# Patient Record
Sex: Male | Born: 1980 | Race: White | Hispanic: No | Marital: Married | Smoking: Never smoker
Health system: Southern US, Community
[De-identification: ages and names within clinical notes are randomized; demographics above are authoritative.]

## PROBLEM LIST (undated history)

## (undated) DIAGNOSIS — G4733 Obstructive sleep apnea (adult) (pediatric): Secondary | ICD-10-CM

## (undated) DIAGNOSIS — E785 Hyperlipidemia, unspecified: Secondary | ICD-10-CM

## (undated) DIAGNOSIS — G4762 Sleep related leg cramps: Secondary | ICD-10-CM

## (undated) DIAGNOSIS — B019 Varicella without complication: Secondary | ICD-10-CM

## (undated) DIAGNOSIS — I10 Essential (primary) hypertension: Secondary | ICD-10-CM

## (undated) DIAGNOSIS — E669 Obesity, unspecified: Secondary | ICD-10-CM

## (undated) HISTORY — DX: Obesity, unspecified: E66.9

## (undated) HISTORY — DX: Essential (primary) hypertension: I10

## (undated) HISTORY — DX: Hyperlipidemia, unspecified: E78.5

## (undated) HISTORY — PX: WISDOM TOOTH EXTRACTION: SHX21

## (undated) HISTORY — DX: Varicella without complication: B01.9

## (undated) HISTORY — DX: Obstructive sleep apnea (adult) (pediatric): G47.33

## (undated) HISTORY — DX: Sleep related leg cramps: G47.62

---

## 2001-09-09 ENCOUNTER — Encounter: Payer: Self-pay | Admitting: Emergency Medicine

## 2001-09-09 ENCOUNTER — Emergency Department (HOSPITAL_COMMUNITY): Admission: EM | Admit: 2001-09-09 | Discharge: 2001-09-09 | Payer: Self-pay | Admitting: Emergency Medicine

## 2007-08-13 ENCOUNTER — Ambulatory Visit: Payer: Self-pay | Admitting: Gastroenterology

## 2014-04-02 ENCOUNTER — Ambulatory Visit (INDEPENDENT_AMBULATORY_CARE_PROVIDER_SITE_OTHER): Payer: 59 | Admitting: Podiatry

## 2014-04-02 ENCOUNTER — Ambulatory Visit (INDEPENDENT_AMBULATORY_CARE_PROVIDER_SITE_OTHER): Payer: 59

## 2014-04-02 ENCOUNTER — Ambulatory Visit: Payer: Self-pay | Admitting: Podiatry

## 2014-04-02 ENCOUNTER — Encounter: Payer: Self-pay | Admitting: Podiatry

## 2014-04-02 VITALS — BP 134/83 | HR 98 | Resp 16 | Ht 68.0 in | Wt 270.0 lb

## 2014-04-02 DIAGNOSIS — M722 Plantar fascial fibromatosis: Secondary | ICD-10-CM

## 2014-04-02 DIAGNOSIS — M779 Enthesopathy, unspecified: Secondary | ICD-10-CM

## 2014-04-02 MED ORDER — DICLOFENAC SODIUM 75 MG PO TBEC: 75.0000 mg | DELAYED_RELEASE_TABLET | Freq: Two times a day (BID) | ORAL | Status: DC

## 2014-04-02 MED ORDER — TRIAMCINOLONE ACETONIDE 10 MG/ML IJ SUSP
10.0000 mg | Freq: Once | INTRAMUSCULAR | Status: AC
  Administered 2014-04-02: 10 mg

## 2014-04-02 NOTE — Progress Notes (Signed)
Subjective:     Patient ID: Caleb [name], male   DOB: [birth year], 33 y.o.   MRN: [id number]  HPI patient states that he's been having increased pain in his feet recently with his heels bothering him and his forefoot around the metatarsal phalangeal joints. It's been getting gradually worse over the last few months   Review of Systems     Objective:   Physical Exam Neurovascular status intact with no health history changes noted and discomfort in the plantar heel of both feet along with lesser MPJs of both feet with mild intensity    Assessment:     Plantar fasciitis of both heels with also compensation and probable forefoot metatarsalgia    Plan:     H&P and x-rays reviewed. Injected the plantar fascia both feet 3 mg Kenalog 500 like Marcaine mixture and advised him reduced activity

## 2014-04-02 NOTE — Patient Instructions (Signed)

## 2014-04-23 ENCOUNTER — Encounter: Payer: Self-pay | Admitting: Podiatry

## 2014-04-23 ENCOUNTER — Ambulatory Visit (INDEPENDENT_AMBULATORY_CARE_PROVIDER_SITE_OTHER): Payer: 59 | Admitting: Podiatry

## 2014-04-23 VITALS — BP 132/80 | HR 99 | Resp 16

## 2014-04-23 DIAGNOSIS — M722 Plantar fascial fibromatosis: Secondary | ICD-10-CM

## 2014-04-23 NOTE — Progress Notes (Signed)
Subjective:     Patient ID: Caleb [name], male   DOB: 1981/03/09, 33 y.o.   MRN: [id number]  HPI patient states that his heels are feeling much better and he is able to walk without pain in his heels or his forefoot for most of his day   Review of Systems     Objective:   Physical Exam Neurovascular status intact with muscle strength adequate and range of motion subtalar midtarsal joint normal. Patient's found to have discomfort in the   plantar heel of a minimal nature and is noted to be walking with a good heel toe gait pattern Assessment:     Plantar fasciitis of both heels that continues to improve    Plan:     Advised on orthotics physical therapy and supportive shoe gear at this time. Will probably require periodic treatment do to activity levels and I also advised on weight loss

## 2014-06-20 ENCOUNTER — Other Ambulatory Visit: Payer: Self-pay | Admitting: Podiatry

## 2015-06-07 ENCOUNTER — Other Ambulatory Visit: Payer: Self-pay

## 2015-06-07 MED ORDER — LISINOPRIL 10 MG PO TABS: 10.0000 mg | ORAL_TABLET | Freq: Every day | ORAL | Status: DC

## 2015-06-07 NOTE — Telephone Encounter (Signed)
Patient was last seen on 01/03/15, practice partner number is [phone], and [facility] [address].

## 2015-06-16 DIAGNOSIS — E785 Hyperlipidemia, unspecified: Secondary | ICD-10-CM

## 2015-06-16 DIAGNOSIS — E669 Obesity, unspecified: Secondary | ICD-10-CM

## 2015-06-16 DIAGNOSIS — R252 Cramp and spasm: Secondary | ICD-10-CM | POA: Insufficient documentation

## 2015-06-16 DIAGNOSIS — I10 Essential (primary) hypertension: Secondary | ICD-10-CM | POA: Insufficient documentation

## 2015-06-16 DIAGNOSIS — G4733 Obstructive sleep apnea (adult) (pediatric): Secondary | ICD-10-CM

## 2015-06-20 ENCOUNTER — Ambulatory Visit: Payer: Self-pay | Admitting: Unknown Physician Specialty

## 2015-07-08 ENCOUNTER — Other Ambulatory Visit: Payer: Self-pay

## 2015-07-08 MED ORDER — LISINOPRIL 10 MG PO TABS
10.0000 mg | ORAL_TABLET | Freq: Every day | ORAL | Status: DC
Start: 2015-07-08 — End: 2015-07-12

## 2015-07-08 NOTE — Telephone Encounter (Signed)
Patient has appointment on 07/12/15 and [facility] is [facility] [address].

## 2015-07-08 NOTE — Telephone Encounter (Signed)
Normal creatinine and K+ Feb 2016; Rx approved

## 2015-07-12 ENCOUNTER — Encounter: Payer: Self-pay | Admitting: Unknown Physician Specialty

## 2015-07-12 ENCOUNTER — Ambulatory Visit (INDEPENDENT_AMBULATORY_CARE_PROVIDER_SITE_OTHER): Payer: 59 | Admitting: Unknown Physician Specialty

## 2015-07-12 VITALS — BP 131/84 | HR 90 | Temp 98.6°F | Ht 66.7 in | Wt 280.2 lb

## 2015-07-12 DIAGNOSIS — E785 Hyperlipidemia, unspecified: Secondary | ICD-10-CM

## 2015-07-12 DIAGNOSIS — I10 Essential (primary) hypertension: Secondary | ICD-10-CM | POA: Diagnosis not present

## 2015-07-12 DIAGNOSIS — Z23 Encounter for immunization: Secondary | ICD-10-CM | POA: Diagnosis not present

## 2015-07-12 DIAGNOSIS — E669 Obesity, unspecified: Secondary | ICD-10-CM | POA: Diagnosis not present

## 2015-07-12 LAB — LIPID PANEL PICCOLO, WAIVED
Chol/HDL Ratio Piccolo,Waive: 4.3 mg/dL
Cholesterol Piccolo, Waived: 176 mg/dL (ref <200)
HDL Chol Piccolo, Waived: 41 mg/dL — ABNORMAL LOW (ref >59)
LDL Chol Calc Piccolo Waived: 102 mg/dL — ABNORMAL HIGH (ref <100)
Triglycerides Piccolo,Waived: 164 mg/dL — ABNORMAL HIGH (ref <150)
VLDL Chol Calc Piccolo,Waive: 33 mg/dL — ABNORMAL HIGH (ref <30)

## 2015-07-12 LAB — BAYER DCA HB A1C WAIVED: HB A1C (BAYER DCA - WAIVED): 5.4 % (ref <7.0)

## 2015-07-12 MED ORDER — LISINOPRIL 10 MG PO TABS: 10.0000 mg | ORAL_TABLET | Freq: Every day | ORAL | Status: DC

## 2015-07-12 NOTE — Assessment & Plan Note (Signed)
Stable, continue present medications.   

## 2015-07-12 NOTE — Assessment & Plan Note (Signed)
LDL is 101.  Focus on diet and weight loss

## 2015-07-12 NOTE — Progress Notes (Signed)
BP 131/84 mmHg  Pulse 90  Temp(Src) 98.6 F (37 C)  Ht 5' 6.7" (1.694 m)  Wt 280 lb 3.2 oz (127.098 kg)  BMI 44.29 kg/m2  SpO2 97%   Subjective:    Patient ID: Caleb [name], male    DOB: 08/27/1981, 34 y.o.   MRN: [id number]  HPI: Caleb [name] is a 34 y.o. male  Chief Complaint  Patient presents with  . Hypertension    pt states he needs Lisinopril refilled, requests 90 day supply if possible   Hypertension This is a chronic problem. Pertinent negatives include no chest pain, headaches, palpitations, shortness of breath or sweats. The current treatment provides significant improvement. There are no compliance problems.  There is no history of chronic renal disease.  Hyperlipidemia Chronicity: Not on statins.  Tried diet, lost weight but gained it back. The problem is controlled. Recent lipid tests were reviewed and are normal. Exacerbating diseases include obesity. He has no history of chronic renal disease. Pertinent negatives include no chest pain or shortness of breath. He is currently on no antihyperlipidemic treatment.    Relevant past medical, surgical, family and social history reviewed and updated as indicated. Interim medical history since our last visit reviewed. Allergies and medications reviewed and updated.  Review of Systems  Respiratory: Negative for shortness of breath.   Cardiovascular: Negative for chest pain and palpitations.  Neurological: Negative for headaches.    Per HPI unless specifically indicated above     Objective:    BP 131/84 mmHg  Pulse 90  Temp(Src) 98.6 F (37 C)  Ht 5' 6.7" (1.694 m)  Wt 280 lb 3.2 oz (127.098 kg)  BMI 44.29 kg/m2  SpO2 97%  Wt Readings from Last 3 Encounters:  07/12/15 280 lb 3.2 oz (127.098 kg)  01/03/15 282 lb (127.914 kg)  04/02/14 270 lb (122.471 kg)    Physical Exam  Constitutional: He is oriented to person, place, and time. He appears well-developed and well-nourished. No distress.  HENT:  Head:  Normocephalic and atraumatic.  Eyes: Conjunctivae and lids are normal. Right eye exhibits no discharge. Left eye exhibits no discharge. No scleral icterus.  Neck: Normal range of motion. Neck supple.  Cardiovascular: Normal rate and regular rhythm.   Pulmonary/Chest: Effort normal. No respiratory distress.  Abdominal: Normal appearance and bowel sounds are normal. He exhibits no distension. There is no splenomegaly or hepatomegaly. There is no tenderness.  Musculoskeletal: Normal range of motion.  Neurological: He is alert and oriented to person, place, and time.  Skin: Skin is warm, dry and intact. No rash noted. No pallor.  Psychiatric: He has a normal mood and affect. His behavior is normal. Judgment and thought content normal.  Nursing note and vitals reviewed.   No results found for this or any previous visit.    Assessment & Plan:   Problem List Items Addressed This Visit      Unprioritized   Obesity   Relevant Orders   Bayer DCA Hb A1c Waived   Hypertension    Stable, continue present medications.       Relevant Medications   lisinopril (PRINIVIL,ZESTRIL) 10 MG tablet   Other Relevant Orders   Lipid Panel Piccolo, Waived   Microalbumin, Urine Waived   Uric acid   Comprehensive metabolic panel   Hyperlipidemia    LDL is 101.  Focus on diet and weight loss      Relevant Medications   lisinopril (PRINIVIL,ZESTRIL) 10 MG tablet   Other  Relevant Orders   Lipid Panel Piccolo, Waived   Comprehensive metabolic panel    Other Visit Diagnoses    Immunization due    -  Primary    Relevant Orders    Flu Vaccine QUAD 36+ mos PF IM (Fluarix & Fluzone Quad PF) (Completed)        Follow up plan: Return in about 6 months (around 01/09/2016) for physical hhhn.

## 2015-07-13 LAB — COMPREHENSIVE METABOLIC PANEL
A/G RATIO: 1.8 (ref 1.1–2.5)
ALT: 29 IU/L (ref 0–44)
AST: 16 IU/L (ref 0–40)
Albumin: 4.2 g/dL (ref 3.5–5.5)
Alkaline Phosphatase: 93 IU/L (ref 39–117)
BILIRUBIN TOTAL: 0.4 mg/dL (ref 0.0–1.2)
BUN/Creatinine Ratio: 14 (ref 8–19)
BUN: 12 mg/dL (ref 6–20)
CHLORIDE: 101 mmol/L (ref 97–108)
CO2: 23 mmol/L (ref 18–29)
Calcium: 9.5 mg/dL (ref 8.7–10.2)
Creatinine, Ser: 0.87 mg/dL (ref 0.76–1.27)
GFR calc Af Amer: 128 mL/min/{1.73_m2} (ref >59)
GFR calc non Af Amer: 111 mL/min/{1.73_m2} (ref >59)
GLUCOSE: 98 mg/dL (ref 65–99)
Globulin, Total: 2.3 g/dL (ref 1.5–4.5)
POTASSIUM: 4.7 mmol/L (ref 3.5–5.2)
Sodium: 141 mmol/L (ref 134–144)
Total Protein: 6.5 g/dL (ref 6.0–8.5)

## 2015-07-13 LAB — URIC ACID: Uric Acid: 6.2 mg/dL (ref 3.7–8.6)

## 2015-07-20 ENCOUNTER — Telehealth: Payer: Self-pay | Admitting: Unknown Physician Specialty

## 2015-07-20 DIAGNOSIS — G4730 Sleep apnea, unspecified: Secondary | ICD-10-CM

## 2015-07-20 NOTE — Telephone Encounter (Signed)
Pt would like rx for cpap equipment faxed to [phone] put order #[id number] with it.

## 2015-07-21 NOTE — Telephone Encounter (Signed)
I need to know his settings.

## 2015-07-21 NOTE — Telephone Encounter (Signed)
Routing to provider  

## 2015-07-22 NOTE — Telephone Encounter (Signed)
Called and asked patient his settings. Patient stated that he has the regular cpap and not the bipap. Also stated that it was set on [place].

## 2015-07-22 NOTE — Telephone Encounter (Signed)
I put the order in.  Thanks!

## 2015-12-02 ENCOUNTER — Ambulatory Visit (INDEPENDENT_AMBULATORY_CARE_PROVIDER_SITE_OTHER): Payer: 59 | Admitting: Family Medicine

## 2015-12-02 ENCOUNTER — Encounter: Payer: Self-pay | Admitting: Family Medicine

## 2015-12-02 VITALS — BP 124/82 | HR 88 | Temp 98.2°F | Ht 67.0 in | Wt 292.5 lb

## 2015-12-02 DIAGNOSIS — I10 Essential (primary) hypertension: Secondary | ICD-10-CM | POA: Diagnosis not present

## 2015-12-02 DIAGNOSIS — M79671 Pain in right foot: Secondary | ICD-10-CM | POA: Diagnosis not present

## 2015-12-02 DIAGNOSIS — E785 Hyperlipidemia, unspecified: Secondary | ICD-10-CM | POA: Diagnosis not present

## 2015-12-02 DIAGNOSIS — Z130 Encounter for screening for diseases of the blood and blood-forming organs and certain disorders involving the immune mechanism: Secondary | ICD-10-CM

## 2015-12-02 DIAGNOSIS — M79672 Pain in left foot: Secondary | ICD-10-CM

## 2015-12-02 DIAGNOSIS — Z0000 Encounter for general adult medical examination without abnormal findings: Secondary | ICD-10-CM

## 2015-12-02 DIAGNOSIS — Z6841 Body Mass Index (BMI) 40.0 and over, adult: Secondary | ICD-10-CM

## 2015-12-02 MED ORDER — PHENTERMINE-TOPIRAMATE ER 3.75-23 MG PO CP24: ORAL_CAPSULE | ORAL | Status: DC

## 2015-12-02 NOTE — Patient Instructions (Signed)
We will call with your nutrition referral.  Take the medication as prescribed.  Follow up in ~ 3 months.  Take care  Dr. Lacinda Axon   Health Maintenance, Male A healthy lifestyle and preventative care can promote health and wellness.  Maintain regular health, dental, and eye exams.  Eat a healthy diet. Foods like vegetables, fruits, whole grains, low-fat dairy products, and lean protein foods contain the nutrients you need and are low in calories. Decrease your intake of foods high in solid fats, added sugars, and salt. Get information about a proper diet from your health care provider, if necessary.  Regular physical exercise is one of the most important things you can do for your health. Most adults should get at least 150 minutes of moderate-intensity exercise (any activity that increases your heart rate and causes you to sweat) each week. In addition, most adults need muscle-strengthening exercises on 2 or more days a week.   Maintain a healthy weight. The body mass index (BMI) is a screening tool to identify possible weight problems. It provides an estimate of body fat based on height and weight. Your health care provider can find your BMI and can help you achieve or maintain a healthy weight. For males 20 years and older:  A BMI below 18.5 is considered underweight.  A BMI of 18.5 to 24.9 is normal.  A BMI of 25 to 29.9 is considered overweight.  A BMI of 30 and above is considered obese.  Maintain normal blood lipids and cholesterol by exercising and minimizing your intake of saturated fat. Eat a balanced diet with plenty of fruits and vegetables. Blood tests for lipids and cholesterol should begin at age 65 and be repeated every 5 years. If your lipid or cholesterol levels are high, you are over age 40, or you are at high risk for heart disease, you may need your cholesterol levels checked more frequently.Ongoing high lipid and cholesterol levels should be treated with medicines if  diet and exercise are not working.  If you smoke, find out from your health care provider how to quit. If you do not use tobacco, do not start.  Lung cancer screening is recommended for adults aged 45-80 years who are at high risk for developing lung cancer because of a history of smoking. A yearly low-dose CT scan of the lungs is recommended for people who have at least a 30-pack-year history of smoking and are current smokers or have quit within the past 15 years. A pack year of smoking is smoking an average of 1 pack of cigarettes a day for 1 year (for example, a 30-pack-year history of smoking could mean smoking 1 pack a day for 30 years or 2 packs a day for 15 years). Yearly screening should continue until the smoker has stopped smoking for at least 15 years. Yearly screening should be stopped for people who develop a health problem that would prevent them from having lung cancer treatment.  If you choose to drink alcohol, do not have more than 2 drinks per day. One drink is considered to be 12 oz (360 mL) of beer, 5 oz (150 mL) of wine, or 1.5 oz (45 mL) of liquor.  Avoid the use of street drugs. Do not share needles with anyone. Ask for help if you need support or instructions about stopping the use of drugs.  High blood pressure causes heart disease and increases the risk of stroke. High blood pressure is more likely to develop in:  People  who have blood pressure in the end of the normal range (100-139/85-89 mm Hg).  People who are overweight or obese.  People who are African American.  If you are 40-44 years of age, have your blood pressure checked every 3-5 years. If you are 66 years of age or older, have your blood pressure checked every year. You should have your blood pressure measured twice--once when you are at a hospital or clinic, and once when you are not at a hospital or clinic. Record the average of the two measurements. To check your blood pressure when you are not at a  hospital or clinic, you can use:  An automated blood pressure machine at a pharmacy.  A home blood pressure monitor.  If you are 51-13 years old, ask your health care provider if you should take aspirin to prevent heart disease.  Diabetes screening involves taking a blood sample to check your fasting blood sugar level. This should be done once every 3 years after age 67 if you are at a normal weight and without risk factors for diabetes. Testing should be considered at a younger age or be carried out more frequently if you are overweight and have at least 1 risk factor for diabetes.  Colorectal cancer can be detected and often prevented. Most routine colorectal cancer screening begins at the age of 73 and continues through age 80. However, your health care provider may recommend screening at an earlier age if you have risk factors for colon cancer. On a yearly basis, your health care provider may provide home test kits to check for hidden blood in the stool. A small camera at the end of a tube may be used to directly examine the colon (sigmoidoscopy or colonoscopy) to detect the earliest forms of colorectal cancer. Talk to your health care provider about this at age 81 when routine screening begins. A direct exam of the colon should be repeated every 5-10 years through age 21, unless early forms of precancerous polyps or small growths are found.  People who are at an increased risk for hepatitis B should be screened for this virus. You are considered at high risk for hepatitis B if:  You were born in a country where hepatitis B occurs often. Talk with your health care provider about which countries are considered high risk.  Your parents were born in a high-risk country and you have not received a shot to protect against hepatitis B (hepatitis B vaccine).  You have HIV or AIDS.  You use needles to inject street drugs.  You live with, or have sex with, someone who has hepatitis B.  You are a  man who has sex with other men (MSM).  You get hemodialysis treatment.  You take certain medicines for conditions like cancer, organ transplantation, and autoimmune conditions.  Hepatitis C blood testing is recommended for all people born from [birth year] through 1967 and any individual with known risk factors for hepatitis C.  Healthy men should no longer receive prostate-specific antigen (PSA) blood tests as part of routine cancer screening. Talk to your health care provider about prostate cancer screening.  Testicular cancer screening is not recommended for adolescents or adult males who have no symptoms. Screening includes self-exam, a health care provider exam, and other screening tests. Consult with your health care provider about any symptoms you have or any concerns you have about testicular cancer.  Practice safe sex. Use condoms and avoid high-risk sexual practices to reduce the spread of sexually  transmitted infections (STIs).  You should be screened for STIs, including gonorrhea and chlamydia if:  You are sexually active and are younger than 24 years.  You are older than 24 years, and your health care provider tells you that you are at risk for this type of infection.  Your sexual activity has changed since you were last screened, and you are at an increased risk for chlamydia or gonorrhea. Ask your health care provider if you are at risk.  If you are at risk of being infected with HIV, it is recommended that you take a prescription medicine daily to prevent HIV infection. This is called pre-exposure prophylaxis (PrEP). You are considered at risk if:  You are a man who has sex with other men (MSM).  You are a heterosexual man who is sexually active with multiple partners.  You take drugs by injection.  You are sexually active with a partner who has HIV.  Talk with your health care provider about whether you are at high risk of being infected with HIV. If you choose to begin PrEP,  you should first be tested for HIV. You should then be tested every 3 months for as long as you are taking PrEP.  Use sunscreen. Apply sunscreen liberally and repeatedly throughout the day. You should seek shade when your shadow is shorter than you. Protect yourself by wearing long sleeves, pants, a wide-brimmed hat, and sunglasses year round whenever you are outdoors.  Tell your health care provider of new moles or changes in moles, especially if there is a change in shape or color. Also, tell your health care provider if a mole is larger than the size of a pencil eraser.  A one-time screening for abdominal aortic aneurysm (AAA) and surgical repair of large AAAs by ultrasound is recommended for men aged 62-75 years who are current or former smokers.  Stay current with your vaccines (immunizations).   This information is not intended to replace advice given to you by your health care provider. Make sure you discuss any questions you have with your health care provider.   Document Released: 04/05/2008 Document Revised: 10/29/2014 Document Reviewed: 03/05/2011 Elsevier Interactive Patient Education [employer].

## 2015-12-04 DIAGNOSIS — M79671 Pain in right foot: Secondary | ICD-10-CM | POA: Insufficient documentation

## 2015-12-04 DIAGNOSIS — E669 Obesity, unspecified: Secondary | ICD-10-CM | POA: Insufficient documentation

## 2015-12-04 DIAGNOSIS — M79672 Pain in left foot: Secondary | ICD-10-CM

## 2015-12-04 DIAGNOSIS — Z0000 Encounter for general adult medical examination without abnormal findings: Secondary | ICD-10-CM | POA: Insufficient documentation

## 2015-12-04 DIAGNOSIS — M722 Plantar fascial fibromatosis: Secondary | ICD-10-CM | POA: Insufficient documentation

## 2015-12-04 NOTE — Assessment & Plan Note (Signed)
Discussed treatment options today: exercise, referral to nutrition, medication, bariatric surgery.  Patient elected for short term medication and nutrition.  Rx for phentermine given.

## 2015-12-04 NOTE — Progress Notes (Signed)
Subjective:  Patient ID: Caleb [name], male    DOB: Jan 14, 1981  Age: 35 y.o. MRN: [id number]  CC: Establish care  HPI Caleb [name] is a 35 y.o. male presents to the clinic today to establish care. Concerns are below.   Preventative Healthcare  Immunizations  Tetanus - 2012.  Pneumococcal - N/A.   Flu - Up to date. Received earlier this year.  Labs: Labs today. See orders.   Exercise: Walks 2 miles daily.   Alcohol use: See below.   Smoking/tobacco use: See below.  Regular dental exams: Yes.   Wears seat belt: Yes.   Hypertension  Well controlled on Lisinopril.  Feet pain  Patient reports bilateral foot pain.  Worse at the end of the day.  No relieving factors.  Has a history plantar fasciitis.  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  L>R.   Pain radiates up left leg.  Obesity   Long standing history of obesity.  Has had success in the past (via diet and exercise; lost ~ 40 lbs). But he put back on the weight.  He would like to discuss treatment options today.   PMH, Surgical Hx, Family Hx, Social History reviewed and updated as below.  Past Medical History  Diagnosis Date  . Obesity   . Hypertension   . Hyperlipidemia   . Obstructive sleep apnea   . Chicken pox    Past Surgical History  Procedure Laterality Date  . Wisdom tooth extraction     Family History  Problem Relation Age of Onset  . Hypertension Mother   . Diabetes Father   . Cancer Maternal Grandmother     colon, liver  . Hypertension Maternal Grandmother   . Cancer Maternal Grandfather     prostate  . Hypertension Maternal Grandfather   . Anemia Paternal Grandmother   . Cancer Paternal Grandfather     leukemia  . Hypertension Maternal Aunt    Social History  Substance Use Topics  . Smoking status: Never Smoker   . Smokeless tobacco: Former Systems developer    Types: Chew  . Alcohol Use: 1.2 oz/week    2 Cans of beer per week     Comment: on occasion     Review of Systems  Respiratory: Positive for cough and shortness of breath.   Cardiovascular: Positive for leg swelling.  Endocrine:       Increased thirst.   Genitourinary: Positive for frequency.  Musculoskeletal:       Muscle cramps/aches. Joint pain/swelling.   Psychiatric/Behavioral:       Stress.   All other systems reviewed and are negative.   Objective:   Today's Vitals: BP 124/82 mmHg  Pulse 88  Temp(Src) 98.2 F (36.8 C) (Oral)  Ht 5' 7"  (1.702 m)  Wt 292 lb 8 oz (132.677 kg)  BMI 45.80 kg/m2  SpO2 94%  Physical Exam  Constitutional: He is oriented to person, place, and time. He appears well-developed and well-nourished. No distress.  Morbidly obese.   HENT:  Head: Normocephalic and atraumatic.  Nose: Nose normal.  Mouth/Throat: Oropharynx is clear and moist. No oropharyngeal exudate.  Normal TM's bilaterally.   Eyes: Conjunctivae are normal. No scleral icterus.  Neck: Neck supple. No thyromegaly present.  Cardiovascular: Normal rate and regular rhythm.   No murmur heard. Pulmonary/Chest: Effort normal and breath sounds normal. He has no wheezes. He has no rales.  Abdominal: Soft. He exhibits no distension. There is no tenderness. There is no rebound  and no guarding.  Musculoskeletal: Normal range of motion. He exhibits no edema.  Lymphadenopathy:    He has no cervical adenopathy.  Neurological: He is alert and oriented to person, place, and time.  Skin: Skin is warm and dry. No rash noted.  Psychiatric: He has a normal mood and affect.  Vitals reviewed.  Assessment & Plan:   Problem List Items Addressed This Visit    Preventative health care    Tdap and flu up to date. Screening labs today.       Pain in both feet    Exam unremarkable. Advised weight loss and orthotics.       Morbid obesity with BMI of 45.0-49.9, adult [facility])    Discussed treatment options today: exercise, referral to nutrition, medication, bariatric surgery.  Patient  elected for short term medication and nutrition.  Rx for phentermine given.      Relevant Medications   Phentermine-Topiramate 3.75-23 MG CP24   Other Relevant Orders   HgB A1c   Amb ref to Medical Nutrition Therapy-MNT   Hypertension    Well controlled. Continue Lisinopril.      Relevant Orders   Comp Met (CMET)   Hyperlipidemia   Relevant Orders   Lipid Profile    Other Visit Diagnoses    Screening for deficiency anemia    -  Primary    Relevant Orders    CBC       Outpatient Encounter Prescriptions as of 12/02/2015  Medication Sig  . clindamycin (CLEOCIN T) 1 % lotion Apply topically 2 (two) times daily as needed.   . multivitamin (ONE-A-DAY MEN'S) TABS tablet Take 1 tablet by mouth daily.  Marland Kitchen lisinopril (PRINIVIL,ZESTRIL) 10 MG tablet Take 1 tablet (10 mg total) by mouth daily.  . Phentermine-Topiramate 3.75-23 MG CP24 1 capsule daily for 14 days. Then increase to 2 capsules daily.   No facility-administered encounter medications on file as of 12/02/2015.    Follow-up: Return in about 3 months (around 02/29/2016).  [place]

## 2015-12-04 NOTE — Assessment & Plan Note (Signed)
Exam unremarkable. Advised weight loss and orthotics.

## 2015-12-04 NOTE — Assessment & Plan Note (Signed)
Tdap and flu up to date. Screening labs today.

## 2015-12-04 NOTE — Assessment & Plan Note (Signed)
Well controlled. Continue Lisinopril. 

## 2015-12-05 ENCOUNTER — Other Ambulatory Visit: Payer: Self-pay | Admitting: Family Medicine

## 2015-12-06 ENCOUNTER — Telehealth: Payer: Self-pay

## 2015-12-06 LAB — COMPREHENSIVE METABOLIC PANEL
A/G RATIO: 1.8 (ref 1.1–2.5)
ALK PHOS: 87 IU/L (ref 39–117)
ALT: 35 IU/L (ref 0–44)
AST: 13 IU/L (ref 0–40)
Albumin: 4.1 g/dL (ref 3.5–5.5)
BILIRUBIN TOTAL: 0.5 mg/dL (ref 0.0–1.2)
BUN / CREAT RATIO: 12 (ref 8–19)
BUN: 12 mg/dL (ref 6–20)
CHLORIDE: 101 mmol/L (ref 96–106)
CO2: 24 mmol/L (ref 18–29)
Calcium: 9.3 mg/dL (ref 8.7–10.2)
Creatinine, Ser: 1.02 mg/dL (ref 0.76–1.27)
GFR calc non Af Amer: 94 mL/min/{1.73_m2} (ref >59)
GFR, EST AFRICAN AMERICAN: 109 mL/min/{1.73_m2} (ref >59)
Globulin, Total: 2.3 g/dL (ref 1.5–4.5)
Glucose: 114 mg/dL — ABNORMAL HIGH (ref 65–99)
POTASSIUM: 4.7 mmol/L (ref 3.5–5.2)
Sodium: 142 mmol/L (ref 134–144)
TOTAL PROTEIN: 6.4 g/dL (ref 6.0–8.5)

## 2015-12-06 LAB — LIPID PANEL W/O CHOL/HDL RATIO
Cholesterol, Total: 194 mg/dL (ref 100–199)
HDL: 39 mg/dL — AB (ref >39)
LDL Calculated: 123 mg/dL — ABNORMAL HIGH (ref 0–99)
Triglycerides: 158 mg/dL — ABNORMAL HIGH (ref 0–149)
VLDL Cholesterol Cal: 32 mg/dL (ref 5–40)

## 2015-12-06 LAB — HGB A1C W/O EAG: HEMOGLOBIN A1C: 5.5 % (ref 4.8–5.6)

## 2015-12-06 NOTE — Telephone Encounter (Signed)
Pa for Qsymia started on cover my meds

## 2015-12-07 ENCOUNTER — Telehealth: Payer: Self-pay | Admitting: Family Medicine

## 2015-12-07 ENCOUNTER — Telehealth: Payer: Self-pay | Admitting: Unknown Physician Specialty

## 2015-12-07 NOTE — Telephone Encounter (Signed)
I just received a fax today, PA was denied due to patient hasn't tried, Belviq, contravee or saxenda.  Please advise?

## 2015-12-07 NOTE — Telephone Encounter (Signed)
We can try plain Phentermine or one of the others if he would like.

## 2015-12-07 NOTE — Telephone Encounter (Signed)
Pt called about his medication of Phentermine-Topiramate 3.75-23 MG . Pt was sent a message about the medication awaiting approval from the doctors office. Call pt @ [phone]. Thank you!

## 2015-12-07 NOTE — Telephone Encounter (Signed)
Spoke with the patient is requested Contravee.  He said his wife had good results with that one.  Please order thanks and I will follow up if PA is needed.

## 2015-12-07 NOTE — Telephone Encounter (Signed)
Lets try phentermine (not with topamax).

## 2015-12-07 NOTE — Telephone Encounter (Signed)
Would patient be okay with switching.

## 2015-12-08 ENCOUNTER — Other Ambulatory Visit: Payer: Self-pay | Admitting: Family Medicine

## 2015-12-08 MED ORDER — NALTREXONE-BUPROPION HCL ER 8-90 MG PO TB12: ORAL_TABLET | ORAL | Status: DC

## 2015-12-09 ENCOUNTER — Telehealth: Payer: Self-pay | Admitting: Family Medicine

## 2015-12-09 NOTE — Telephone Encounter (Signed)
Pa Process started on Cover my meds.

## 2015-12-09 NOTE — Telephone Encounter (Signed)
Pt called about his medication needing prior approval pt states this is going on the second week. Medication is Naltrexone-Bupropion HCl ER 8-90 MG TB12. [id number] [address], [place]. Call pt @ [phone]. Thank you!

## 2015-12-09 NOTE — Telephone Encounter (Signed)
Started PA process for new prescription for Contravee.

## 2015-12-12 NOTE — Telephone Encounter (Signed)
PA approved from today to 04/07/2016.

## 2015-12-13 ENCOUNTER — Telehealth: Payer: Self-pay | Admitting: Family Medicine

## 2015-12-13 NOTE — Telephone Encounter (Signed)
[facility] called stating that [facility] not [facility] so his upset that his insurance is not approving.

## 2015-12-14 NOTE — Telephone Encounter (Signed)
This was approved on Monday, can we call the pharmacy to check?

## 2015-12-14 NOTE — Telephone Encounter (Signed)
Patient was told that prescription was approved on Monday and that he needed to contact pharmacy about that medication to see if it was approved.

## 2015-12-15 ENCOUNTER — Encounter: Payer: 59 | Attending: Family Medicine | Admitting: Dietician

## 2015-12-15 ENCOUNTER — Encounter: Payer: Self-pay | Admitting: Dietician

## 2015-12-15 VITALS — Ht 67.0 in | Wt 292.1 lb

## 2015-12-15 DIAGNOSIS — G4730 Sleep apnea, unspecified: Secondary | ICD-10-CM | POA: Insufficient documentation

## 2015-12-15 DIAGNOSIS — E669 Obesity, unspecified: Secondary | ICD-10-CM

## 2015-12-15 DIAGNOSIS — I10 Essential (primary) hypertension: Secondary | ICD-10-CM | POA: Insufficient documentation

## 2015-12-15 NOTE — Patient Instructions (Signed)
   Start tracking food intake, try phone app such as MyFitnessPal or Lose It!, or Spark[employer].   Plan for a snack each night when you get home, and for a light meal in the morning, at least 1-2 hours after waking up.  Pre-portion snacks, avoid eating from a large box/bag, etc.   Keep starch portions to fist-size or less, and eat generous portions of vegetables. Ok to include vegetables with your snacks also.

## 2015-12-15 NOTE — Progress Notes (Signed)
Medical Nutrition Therapy: Visit start time: 1030  end time: 1130  Assessment:  Diagnosis: obesity Past medical history: HTN, sleep apnea Psychosocial issues/ stress concerns: reports high stress level, works long hours at stressful job Preferred learning method:  . Visual  Current weight: 392.1lbs with shoes  Height: 5'7" Medications, supplements: reviewed list in chart with patient  Progress and evaluation: Patient reports losing down to 230lbs several years ago, but has regained that weight.         Reports stressful job which affects eating; snacking late at night and then not hungry in the morning.         Has made changes to control portions and food choices; using a home-delivery meal service for supper meals  Physical activity: walking, stretching 30 minutes, 3 times a week  Dietary Intake:  Usual eating pattern includes 2 meals and 1-2 snacks per day. Dining out frequency: 4 meals per week.  Breakfast: none Snack: none Lunch: chicken, occasionally burger Snack: sometimes nuts, fruit snack, crackers, coffee Supper: 6pm chicken, sometimes shrimp or small steak, roasted vegetables, sweet potatoes. No fried foods. Snack: hungry when getting home from work. Sometimes cereal, chips, nuts, juice, tea.  Beverages: water, some sweet tea, juice. 1-2 sodas per week  Nutrition Care Education: Topics covered: weight management Basic nutrition: basic food groups, appropriate nutrient balance, appropriate meal and snack schedule, general nutrition guidelines    Weight control: behavioral changes for weight loss: importance of low fat and low sugar foods, portion control, other factors affecting weight such as stress and sleep.      1900kcal meal plan with 40-50%CHO, 20-30% protein, 30% fat. Other lifestyle changes:  Deep breathing and stretching exercises to relieve stress.   Nutritional Diagnosis:  [place]-3.3 Overweight/obesity As related to stress, excess caloric intake.  As evidenced by  patient report.  Intervention: Instruction as noted above.   Set goals with patient input.   Patient feels he will need to continue efforts to reduce food portions.    He will add protein to snack at night.    He requested follow-up more than 1 month out, and after out-of-town trip.   Education Materials given:  . Food lists/ Planning A Balanced Meal . Snacking handout . Goals/ instructions  Learner/ who was taught:  . Patient   Level of understanding: Marland Kitchen Verbalizes/ demonstrates competency  Demonstrated degree of understanding via:   Teach back Learning barriers: . None  Willingness to learn/ readiness for change: . Eager, change in progress  Monitoring and Evaluation:  Dietary intake, exercise, and body weight      follow up: 02/06/16

## 2016-01-13 ENCOUNTER — Encounter: Payer: 59 | Admitting: Unknown Physician Specialty

## 2016-02-03 ENCOUNTER — Other Ambulatory Visit: Payer: Self-pay | Admitting: Unknown Physician Specialty

## 2016-02-06 ENCOUNTER — Encounter: Payer: Self-pay | Admitting: Dietician

## 2016-02-06 ENCOUNTER — Encounter: Payer: 59 | Attending: Family Medicine | Admitting: Dietician

## 2016-02-06 VITALS — Ht 67.0 in | Wt 278.1 lb

## 2016-02-06 DIAGNOSIS — G4730 Sleep apnea, unspecified: Secondary | ICD-10-CM | POA: Insufficient documentation

## 2016-02-06 DIAGNOSIS — E669 Obesity, unspecified: Secondary | ICD-10-CM | POA: Diagnosis not present

## 2016-02-06 DIAGNOSIS — I10 Essential (primary) hypertension: Secondary | ICD-10-CM | POA: Diagnosis not present

## 2016-02-06 NOTE — Progress Notes (Signed)
Medical Nutrition Therapy: Visit start time: 0930  end time: 1000  Assessment:  Diagnosis: obesity Medical history changes: no changes per patient Psychosocial issues/ stress concerns: none  Current weight: 278.1lbs  Height: 5'7" Medications, supplement changes: no changes  Progress and evaluation: Weight loss of 12 lbs since 12/15/15.         Patient reports most significant change has been decrease in sugar intake--he has almost eliminated sugar in tea, has stopped using sweet coffee creamers,     And has read food labels to check sugar content in foods, choosing low sugar options.          He also reports smaller portions of starchy foods and sometimes meats i.e. Steak.          He is including fruits and vegetables with all meals.   Physical activity: walking on the job and additionally at home at least 3 times per week.   Dietary Intake:  Usual eating pattern includes 3 meals and 2 snacks per day. Dining out frequency: not assessed today.  Breakfast: breakfast cookie 100kcal, or low-sugar granola/protein bar + orange or tangerine Snack: maybe fruit, cheese stick Lunch: chicken grilled or baked, veg.  Snack: protein bar Supper: grilled or baked meats, salads or other veg.  Snack: fruit, nuts, or cheese stick Beverages: water, very low sugar tea or tea with Stevia or monk fruit sweetener, coffee  Nutrition Care Education: Topics covered: weight management Basic nutrition: appropriate meal and snack schedule: effects of long periods of fasting vs. eating at regular intervals on metabolism.    Weight control: reviewed goal of low sugar intake and health benefits; Discussed dealing with possible weight loss plateaus or potential obstacles to healthy diet and activity in upcoming months.  Hypertension: reviewed effects of weight loss on blood pressure.  Nutritional Diagnosis:  [place]-3.3 Overweight/obesity As related to history of excess calories.  As evidenced by patient  report.  Intervention: Discussion as noted above.   Commended patient for changes made thus far.   Encouraged him to continue with food tracking and regular weighing.    Patient requested next follow-up in 6 months; scheduled for October.   Education Materials given:  Marland Kitchen Goals/ instructions  Learner/ who was taught:  . Patient   Level of understanding: Marland Kitchen Verbalizes/ demonstrates competency  Demonstrated degree of understanding via:   Teach back Learning barriers: . None  Willingness to learn/ readiness for change: . Eager, change in progress  Monitoring and Evaluation:  Dietary intake, exercise, and body weight      follow up: 07/30/16

## 2016-02-06 NOTE — Patient Instructions (Signed)
   Continue to decrease added sugars in beverages and foods.   Keep up with healthy food choices and eating pattern as well as physical activity -- great job!

## 2016-02-21 ENCOUNTER — Ambulatory Visit (INDEPENDENT_AMBULATORY_CARE_PROVIDER_SITE_OTHER): Payer: 59 | Admitting: Unknown Physician Specialty

## 2016-02-21 ENCOUNTER — Encounter: Payer: Self-pay | Admitting: Unknown Physician Specialty

## 2016-02-21 VITALS — BP 134/83 | HR 94 | Temp 98.5°F | Ht 65.3 in | Wt 273.8 lb

## 2016-02-21 DIAGNOSIS — I10 Essential (primary) hypertension: Secondary | ICD-10-CM | POA: Diagnosis not present

## 2016-02-21 DIAGNOSIS — Z6841 Body Mass Index (BMI) 40.0 and over, adult: Secondary | ICD-10-CM | POA: Diagnosis not present

## 2016-02-21 DIAGNOSIS — E785 Hyperlipidemia, unspecified: Secondary | ICD-10-CM | POA: Diagnosis not present

## 2016-02-21 DIAGNOSIS — Z0000 Encounter for general adult medical examination without abnormal findings: Secondary | ICD-10-CM | POA: Diagnosis not present

## 2016-02-21 LAB — UA/M W/RFLX CULTURE, ROUTINE
BILIRUBIN UA: NEGATIVE
GLUCOSE, UA: NEGATIVE
KETONES UA: NEGATIVE
Leukocytes, UA: NEGATIVE
NITRITE UA: NEGATIVE
Protein, UA: NEGATIVE
RBC UA: NEGATIVE
Specific Gravity, UA: <1.005 — ABNORMAL LOW (ref 1.005–1.030)
UUROB: 0.2 mg/dL (ref 0.2–1.0)
pH, UA: 6 (ref 5.0–7.5)

## 2016-02-21 NOTE — Assessment & Plan Note (Signed)
Discussed diet.  Pt wnet to a nutritionist and able to cut out sugar and lost about 20 pounds.  He continues to work on a healthy diet.

## 2016-02-21 NOTE — Addendum Note (Signed)
Addended by: Kathrine Haddock on: 02/21/2016 10:41 AM   Modules accepted: Miquel Dunn

## 2016-02-21 NOTE — Progress Notes (Addendum)
BP 134/83 mmHg  Pulse 94  Temp(Src) 98.5 F (36.9 C)  Ht 5' 5.3" (1.659 m)  Wt 273 lb 12.8 oz (124.195 kg)  BMI 45.12 kg/m2  SpO2 97%   Subjective:    Patient ID: Caleb [name], male    DOB: 08/27/1981, 35 y.o.   MRN: [id number]  HPI: OMA SKUFCA is a 35 y.o. male  Chief Complaint  Patient presents with  . Annual Exam   Hypertension Using medications without difficulty Average home BPs 125/80   No problems or lightheadedness No chest pain with exertion or shortness of breath No Edema  Past Medical History  Diagnosis Date  . Obesity   . Hypertension   . Hyperlipidemia   . Obstructive sleep apnea   . Chicken pox    Past Surgical History  Procedure Laterality Date  . Wisdom tooth extraction     Family History  Problem Relation Age of Onset  . Hypertension Mother   . Diabetes Father   . Cancer Maternal Grandmother     colon, liver  . Hypertension Maternal Grandmother   . Cancer Maternal Grandfather     prostate  . Hypertension Maternal Grandfather   . Anemia Paternal Grandmother   . Cancer Paternal Grandfather     leukemia  . Hypertension Maternal Aunt    Social History   Social History  . Marital Status: Married    Spouse Name: N/A  . Number of Children: N/A  . Years of Education: N/A   Social History Main Topics  . Smoking status: Never Smoker   . Smokeless tobacco: Former Systems developer    Types: Chew  . Alcohol Use: 1.2 oz/week    2 Cans of beer per week     Comment: on occasion  . Drug Use: No  . Sexual Activity: Yes   Other Topics Concern  . None   Social History Narrative      Relevant past medical, surgical, family and social history reviewed and updated as indicated. Interim medical history since our last visit reviewed. Allergies and medications reviewed and updated.  Review of Systems  Per HPI unless specifically indicated above     Objective:    BP 134/83 mmHg  Pulse 94  Temp(Src) 98.5 F (36.9 C)  Ht 5' 5.3" (1.659 m)   Wt 273 lb 12.8 oz (124.195 kg)  BMI 45.12 kg/m2  SpO2 97%  Wt Readings from Last 3 Encounters:  02/21/16 273 lb 12.8 oz (124.195 kg)  02/06/16 278 lb 1.6 oz (126.145 kg)  12/15/15 292 lb 1.6 oz (132.496 kg)    Physical Exam  Constitutional: He is oriented to person, place, and time. He appears well-developed and well-nourished.  HENT:  Head: Normocephalic.  Right Ear: Tympanic membrane, external ear and ear canal normal.  Left Ear: Tympanic membrane, external ear and ear canal normal.  Mouth/Throat: Uvula is midline, oropharynx is clear and moist and mucous membranes are normal.  Eyes: Pupils are equal, round, and reactive to light.  Cardiovascular: Normal rate, regular rhythm and normal heart sounds.  Exam reveals no gallop and no friction rub.   No murmur heard. Pulmonary/Chest: Effort normal and breath sounds normal. No respiratory distress.  Abdominal: Soft. Bowel sounds are normal. He exhibits no distension. There is no tenderness.  Musculoskeletal: Normal range of motion.  Neurological: He is alert and oriented to person, place, and time. He has normal reflexes.  Skin: Skin is warm and dry.  Psychiatric: He has a normal mood  and affect. His behavior is normal. Judgment and thought content normal.    Results for orders placed or performed in visit on 12/05/15  Comprehensive metabolic panel  Result Value Ref Range   Glucose 114 (H) 65 - 99 mg/dL   BUN 12 6 - 20 mg/dL   Creatinine, Ser 1.02 0.76 - 1.27 mg/dL   GFR calc non Af Amer 94 >59 mL/min/1.73   GFR calc Af Amer 109 >59 mL/min/1.73   BUN/Creatinine Ratio 12 8 - 19   Sodium 142 134 - 144 mmol/L   Potassium 4.7 3.5 - 5.2 mmol/L   Chloride 101 96 - 106 mmol/L   CO2 24 18 - 29 mmol/L   Calcium 9.3 8.7 - 10.2 mg/dL   Total Protein 6.4 6.0 - 8.5 g/dL   Albumin 4.1 3.5 - 5.5 g/dL   Globulin, Total 2.3 1.5 - 4.5 g/dL   Albumin/Globulin Ratio 1.8 1.1 - 2.5   Bilirubin Total 0.5 0.0 - 1.2 mg/dL   Alkaline Phosphatase 87  39 - 117 IU/L   AST 13 0 - 40 IU/L   ALT 35 0 - 44 IU/L  Lipid Panel w/o Chol/HDL Ratio  Result Value Ref Range   Cholesterol, Total 194 100 - 199 mg/dL   Triglycerides 158 (H) 0 - 149 mg/dL   HDL 39 (L) >39 mg/dL   VLDL Cholesterol Cal 32 5 - 40 mg/dL   LDL Calculated 123 (H) 0 - 99 mg/dL  Hgb A1c w/o eAG  Result Value Ref Range   Hgb A1c MFr Bld 5.5 4.8 - 5.6 %      Assessment & Plan:   Problem List Items Addressed This Visit      Unprioritized   Hyperlipidemia   Relevant Orders   Lipid Panel w/o Chol/HDL Ratio   Hypertension   Relevant Orders   Comprehensive metabolic panel   Morbid obesity with BMI of 45.0-49.9, adult ([place]) - Primary    Discussed diet.  Pt wnet to a nutritionist and able to cut out sugar and lost about 20 pounds.  He continues to work on a healthy diet.        Other Visit Diagnoses    Routine general medical examination at a health care facility        Relevant Orders    CBC with Differential/Platelet    Comprehensive metabolic panel    Lipid Panel w/o Chol/HDL Ratio    TSH    UA/M w/rflx Culture, Routine        Follow up plan: No Follow-up on file.

## 2016-02-22 ENCOUNTER — Encounter (INDEPENDENT_AMBULATORY_CARE_PROVIDER_SITE_OTHER): Payer: Self-pay | Admitting: Family Medicine

## 2016-02-22 DIAGNOSIS — Z0289 Encounter for other administrative examinations: Secondary | ICD-10-CM

## 2016-02-22 LAB — COMPREHENSIVE METABOLIC PANEL
A/G RATIO: 1.7 (ref 1.2–2.2)
ALBUMIN: 4.1 g/dL (ref 3.5–5.5)
ALK PHOS: 93 IU/L (ref 39–117)
ALT: 29 IU/L (ref 0–44)
AST: 17 IU/L (ref 0–40)
BUN / CREAT RATIO: 10 (ref 9–20)
BUN: 9 mg/dL (ref 6–20)
Bilirubin Total: 0.6 mg/dL (ref 0.0–1.2)
CO2: 24 mmol/L (ref 18–29)
CREATININE: 0.9 mg/dL (ref 0.76–1.27)
Calcium: 9.1 mg/dL (ref 8.7–10.2)
Chloride: 99 mmol/L (ref 96–106)
GFR calc Af Amer: 127 mL/min/{1.73_m2} (ref >59)
GFR calc non Af Amer: 109 mL/min/{1.73_m2} (ref >59)
GLOBULIN, TOTAL: 2.4 g/dL (ref 1.5–4.5)
Glucose: 89 mg/dL (ref 65–99)
POTASSIUM: 4.1 mmol/L (ref 3.5–5.2)
SODIUM: 139 mmol/L (ref 134–144)
Total Protein: 6.5 g/dL (ref 6.0–8.5)

## 2016-02-22 LAB — LIPID PANEL W/O CHOL/HDL RATIO
CHOLESTEROL TOTAL: 177 mg/dL (ref 100–199)
HDL: 35 mg/dL — ABNORMAL LOW (ref >39)
LDL CALC: 117 mg/dL — AB (ref 0–99)
TRIGLYCERIDES: 127 mg/dL (ref 0–149)
VLDL Cholesterol Cal: 25 mg/dL (ref 5–40)

## 2016-02-22 LAB — CBC WITH DIFFERENTIAL/PLATELET
BASOS: 1 %
Basophils Absolute: 0.1 10*3/uL (ref 0.0–0.2)
EOS (ABSOLUTE): 0.2 10*3/uL (ref 0.0–0.4)
EOS: 3 %
HEMATOCRIT: 46.6 % (ref 37.5–51.0)
HEMOGLOBIN: 15.6 g/dL (ref 12.6–17.7)
Immature Grans (Abs): 0 10*3/uL (ref 0.0–0.1)
Immature Granulocytes: 0 %
LYMPHS ABS: 1.7 10*3/uL (ref 0.7–3.1)
Lymphs: 20 %
MCH: 31.7 pg (ref 26.6–33.0)
MCHC: 33.5 g/dL (ref 31.5–35.7)
MCV: 95 fL (ref 79–97)
MONOCYTES: 8 %
MONOS ABS: 0.7 10*3/uL (ref 0.1–0.9)
NEUTROS ABS: 5.9 10*3/uL (ref 1.4–7.0)
Neutrophils: 68 %
Platelets: 288 10*3/uL (ref 150–379)
RBC: 4.92 x10E6/uL (ref 4.14–5.80)
RDW: 13.4 % (ref 12.3–15.4)
WBC: 8.6 10*3/uL (ref 3.4–10.8)

## 2016-02-22 LAB — TSH: TSH: 2.15 u[IU]/mL (ref 0.450–4.500)

## 2016-02-22 NOTE — Progress Notes (Unsigned)
Third class FAA flight physical

## 2016-02-29 ENCOUNTER — Ambulatory Visit: Payer: Self-pay | Admitting: Family Medicine

## 2016-03-05 ENCOUNTER — Ambulatory Visit (INDEPENDENT_AMBULATORY_CARE_PROVIDER_SITE_OTHER)
Admission: RE | Admit: 2016-03-05 | Discharge: 2016-03-05 | Disposition: A | Discharge status: Home / Self Care | Payer: 59 | Source: Ambulatory Visit | Attending: Family Medicine | Admitting: Family Medicine

## 2016-03-05 ENCOUNTER — Ambulatory Visit (INDEPENDENT_AMBULATORY_CARE_PROVIDER_SITE_OTHER): Payer: 59 | Admitting: Family Medicine

## 2016-03-05 VITALS — BP 126/82 | HR 106 | Temp 98.5°F | Ht 67.0 in | Wt 277.6 lb

## 2016-03-05 DIAGNOSIS — G4733 Obstructive sleep apnea (adult) (pediatric): Secondary | ICD-10-CM | POA: Diagnosis not present

## 2016-03-05 DIAGNOSIS — W19XXXA Unspecified fall, initial encounter: Secondary | ICD-10-CM | POA: Diagnosis not present

## 2016-03-05 DIAGNOSIS — Z6841 Body Mass Index (BMI) 40.0 and over, adult: Secondary | ICD-10-CM

## 2016-03-05 DIAGNOSIS — M79644 Pain in right finger(s): Secondary | ICD-10-CM

## 2016-03-05 DIAGNOSIS — I10 Essential (primary) hypertension: Secondary | ICD-10-CM | POA: Diagnosis not present

## 2016-03-05 NOTE — Assessment & Plan Note (Signed)
Improving. Advised continued diet and exercise.

## 2016-03-05 NOTE — Assessment & Plan Note (Signed)
Well controlled. Continue Lisinopril. 

## 2016-03-05 NOTE — Patient Instructions (Signed)
It was nice to see you today.  Continue your current medications.  We will call with your xray results.  Follow up:  Annually  Take care  Dr. Lacinda Axon

## 2016-03-05 NOTE — Assessment & Plan Note (Signed)
Well controlled on CPAP. Losing weight.

## 2016-03-05 NOTE — Assessment & Plan Note (Signed)
New problem. Concern for fracture given exam and mechanism of injury. Sending for x-ray.

## 2016-03-05 NOTE — Progress Notes (Signed)
Subjective:  Patient ID: Caleb [name], male    DOB: 07/23/1981  Age: 35 y.o. MRN: [id number]  CC: Follow up, recent fall resulting in finger injury.  HPI:  35 year old male presents for follow up.  Morbid obesity  Patient reports that he's lost approximately 20 pounds after seeing a nutritionist and taking the Contrave.  He is currently off the medication.  He is happy with his current weight loss and is aiming for more.  HTN  Stable.  Well controlled on Lisinopril.  OSA  Stable on CPAP.  No complaints at this time.  Fall/injury  Patient reports that 2 weeks ago he slipped going down some steps.  He reached backwards to catch himself and injured his right middle finger.  He reports pain at PIP joint. He also reports the pain extends proximally.  He notes some swelling as well.  Pain particularly bothersome with lifting.  No known relieving factors.  He has not seen a physician for this. No x-ray has been obtained.  Social Hx   Social History   Social History  . Marital Status: Married    Spouse Name: N/A  . Number of Children: N/A  . Years of Education: N/A   Social History Main Topics  . Smoking status: Never Smoker   . Smokeless tobacco: Former Systems developer    Types: Chew  . Alcohol Use: 1.2 oz/week    2 Cans of beer per week     Comment: on occasion  . Drug Use: No  . Sexual Activity: Yes   Other Topics Concern  . Not on file   Social History Narrative   Review of Systems  Constitutional: Negative.   Musculoskeletal: Positive for joint swelling.   Objective:  BP 126/82 mmHg  Pulse 106  Temp(Src) 98.5 F (36.9 C) (Oral)  Ht 5\' 7"  (1.702 m)  Wt 277 lb 9.6 oz (125.919 kg)  BMI 43.47 kg/m2  SpO2 96%  BP/Weight 03/05/2016 02/21/2016 [id number]  Systolic BP [id number] -  Diastolic BP 82 83 -  Wt. (Lbs) 277.6 273.8 278.1  BMI 43.47 45.12 43.55   Physical Exam  Constitutional: He is oriented to person, place, and time. He appears  well-developed. No distress.  Pulmonary/Chest: Effort normal.  Musculoskeletal:  R middle finger - Tenderness at the PIP joint. Swelling noted.  Neurological: He is alert and oriented to person, place, and time.  Psychiatric: He has a normal mood and affect.  Vitals reviewed.  Lab Results  Component Value Date   WBC 8.6 02/21/2016   HCT 46.6 02/21/2016   PLT 288 02/21/2016   GLUCOSE 89 02/21/2016   CHOL 177 02/21/2016   TRIG 127 02/21/2016   HDL 35* 02/21/2016   LDLCALC 117* 02/21/2016   ALT 29 02/21/2016   AST 17 02/21/2016   NA 139 02/21/2016   K 4.1 02/21/2016   CL 99 02/21/2016   CREATININE 0.90 02/21/2016   BUN 9 02/21/2016   CO2 24 02/21/2016   TSH 2.150 02/21/2016   HGBA1C 5.5 12/05/2015   Assessment & Plan:   Problem List Items Addressed This Visit    Pain of right middle finger    New problem. Concern for fracture given exam and mechanism of injury. Sending for x-ray.       Relevant Orders   DG Finger Middle Right (Completed)   Obstructive sleep apnea    Well controlled on CPAP. Losing weight.      Morbid obesity with BMI of 45.0-49.9, adult ([place]) -  Primary    Improving. Advised continued diet and exercise.      Hypertension    Well controlled. Continue Lisinopril.        Follow-up: PRN  [place]

## 2016-03-06 ENCOUNTER — Telehealth: Payer: Self-pay | Admitting: Family Medicine

## 2016-03-06 ENCOUNTER — Encounter: Payer: Self-pay | Admitting: Family Medicine

## 2016-03-06 NOTE — Telephone Encounter (Signed)
Pt dropped off a [employer] medical form to be filled out by Dr. Lacinda Axon. Paper work is in Dr. Jonathon Jordan box.

## 2016-03-06 NOTE — Telephone Encounter (Signed)
Patient informed that letter would be placed upfront for pick up.

## 2016-03-28 ENCOUNTER — Telehealth: Payer: Self-pay | Admitting: Family Medicine

## 2016-03-28 NOTE — Telephone Encounter (Signed)
Call pt 

## 2016-03-28 NOTE — Telephone Encounter (Signed)
Patient called stating that he got a letter from [facility] and it stated that narrative OSA from Dr. Jeananne Rama. Please call patient back [phone], fax number [phone].

## 2016-04-09 ENCOUNTER — Telehealth: Payer: Self-pay

## 2016-04-09 ENCOUNTER — Ambulatory Visit (INDEPENDENT_AMBULATORY_CARE_PROVIDER_SITE_OTHER): Payer: 59

## 2016-04-09 ENCOUNTER — Ambulatory Visit (INDEPENDENT_AMBULATORY_CARE_PROVIDER_SITE_OTHER): Payer: 59 | Admitting: Podiatry

## 2016-04-09 ENCOUNTER — Encounter: Payer: Self-pay | Admitting: Podiatry

## 2016-04-09 DIAGNOSIS — S92911A Unspecified fracture of right toe(s), initial encounter for closed fracture: Secondary | ICD-10-CM | POA: Diagnosis not present

## 2016-04-09 NOTE — Progress Notes (Signed)
He presents today with chief complaint of a painful fifth digit of the right foot stating that he collided with another individual while at the gym. He states that his toe was been sore and painful since that day which was a week ago today.  Objective: Vital signs are stable he is alert and oriented 3. Pulses are palpable left foot. No erythema edema cellulitis drainage or odor. No ecchymosis noted. Radiograph does demonstrate a fracture fragment to the proximal phalanx medial condyle fifth digit right foot. Mildly tender on palpation of this area today.  Assessment: Fracture fifth digit right.  Plan: Discussed etiology and pathology conservative versus surgical therapies. I demonstrated to him how to wrap the toe with Coban and recommended that he follow up with me as needed for possible arthroplasty.

## 2016-04-09 NOTE — Telephone Encounter (Signed)
FAA issue, spoke with MAC

## 2016-05-23 ENCOUNTER — Telehealth: Payer: Self-pay | Admitting: Family Medicine

## 2016-05-23 NOTE — Telephone Encounter (Signed)
Pt called and he would like to speak to Dr Jeananne Rama about a particular form he received from the [facility].

## 2016-05-30 ENCOUNTER — Encounter: Payer: Self-pay | Admitting: Podiatry

## 2016-05-30 ENCOUNTER — Ambulatory Visit (INDEPENDENT_AMBULATORY_CARE_PROVIDER_SITE_OTHER): Payer: 59 | Admitting: Podiatry

## 2016-05-30 ENCOUNTER — Ambulatory Visit (INDEPENDENT_AMBULATORY_CARE_PROVIDER_SITE_OTHER): Payer: 59

## 2016-05-30 VITALS — BP 120/74 | HR 84 | Resp 16

## 2016-05-30 DIAGNOSIS — M7662 Achilles tendinitis, left leg: Secondary | ICD-10-CM

## 2016-05-30 DIAGNOSIS — M25572 Pain in left ankle and joints of left foot: Secondary | ICD-10-CM

## 2016-05-31 ENCOUNTER — Telehealth: Payer: Self-pay | Admitting: *Deleted

## 2016-05-31 NOTE — Telephone Encounter (Addendum)
Pt asked if he needed an achilles plate for the walking boot.  I told the pt no, at this time Dr. Paulla Dolly was stabilizing the achilles tendon and calf, stopping the achilles from shortening then stretching to walk, by holding it at a 90 degree angle.  Pt states understanding. 06/08/2016-Pt states he cancelled his 4 week appt, because he is better and out of the boot.  Pt states he saw his PCP who is also a sports medicine doctor.

## 2016-05-31 NOTE — Progress Notes (Signed)
Subjective:     Patient ID: Caleb [name], male   DOB: 01/16/1981, 35 y.o.   MRN: [id number]  HPI patient presents stating that he was doing a form of karate last night and injured his left Achilles tendon and it's been swollen and he cannot bear weight   Review of Systems  All other systems reviewed and are negative.      Objective:   Physical Exam  Constitutional: He is oriented to person, place, and time.  Cardiovascular: Intact distal pulses.   Musculoskeletal: Normal range of motion.  Neurological: He is oriented to person, place, and time.  Skin: Skin is warm.  Nursing note and vitals reviewed.  neurovascular status is found to be intact and muscle strength was adequate including the Achilles tendon of the left which was checked extensively. There is significant swelling around the musculotendinous junction left but the tendon itself appears to be intact and appears to be functioning but is very strained and painful     Assessment:     Achilles tendinitis left with possible damage to the musculotendinous junction    Plan:     H&P x-ray reviewed and I educated him extensively on possibility for rupture of the Achilles associated with this type of injury. At this point we'll get ago with 4 weeks complete immobilization and I dispensed air fracture walker with all instructions on usage and instructed on heat and ice therapy. Patient will be seen back in 4 weeks and if symptoms were to get worse or if he should have any further problems we'll send him for MRI

## 2016-06-06 ENCOUNTER — Ambulatory Visit (INDEPENDENT_AMBULATORY_CARE_PROVIDER_SITE_OTHER): Payer: 59 | Admitting: Family Medicine

## 2016-06-06 ENCOUNTER — Encounter: Payer: Self-pay | Admitting: Family Medicine

## 2016-06-06 VITALS — BP 122/80 | HR 82 | Temp 98.8°F | Ht 67.0 in | Wt 255.2 lb

## 2016-06-06 DIAGNOSIS — S93422A Sprain of deltoid ligament of left ankle, initial encounter: Secondary | ICD-10-CM | POA: Diagnosis not present

## 2016-06-06 DIAGNOSIS — M76822 Posterior tibial tendinitis, left leg: Secondary | ICD-10-CM

## 2016-06-06 NOTE — Progress Notes (Signed)
Pre visit review using our clinic review tool, if applicable. No additional management support is needed unless otherwise documented below in the visit note. 

## 2016-06-06 NOTE — Progress Notes (Signed)
Dr. Frederico Hamman T. Inessa Wardrop, MD, [facility] [place], [address] Phone: [phone] Fax: [phone]  06/06/2016  Patient: Caleb [name], MRN: [id number], DOB: [birth year], 35 y.o.  Primary Physician:  Coral Spikes, DO   Chief Complaint  Patient presents with  . Foot Pain    left inside of ankle   Subjective:   Caleb [name] is a 35 y.o. very pleasant male patient who presents with the following:  DOI 05/30/2016  Caleb [name] - was doing some footwork last week. May have stepped on it and could feel some pain in it. Limped out to his truck. Severe pain by the end of class.   Saw Dr. Zachery Dauer in [place] - had some medial swelling. He was concerned for ?  Placed in boot for 4 weeks. He was concerned for ? Achilles rupture.  Has been wearing boot for 1 weeks.   X-ray, Ankle: AP, Lateral, and Mortise Views Indication: Ankle pain Findings: There is no evidence for acute fracture or dislocation. The mortise appears preserved.   Past Medical History, Surgical History, Social History, Family History, Problem List, Medications, and Allergies have been reviewed and updated if relevant.  Patient Active Problem List   Diagnosis Date Noted  . Pain of right middle finger 03/05/2016  . Pain in both feet 12/04/2015  . Morbid obesity with BMI of 45.0-49.9, adult ([place]) 12/04/2015  . Preventative health care 12/04/2015  . Hypertension 06/16/2015  . Hyperlipidemia 06/16/2015  . Obstructive sleep apnea 06/16/2015    Past Medical History:  Diagnosis Date  . Chicken pox   . Hyperlipidemia   . Hypertension   . Obesity   . Obstructive sleep apnea     Past Surgical History:  Procedure Laterality Date  . WISDOM TOOTH EXTRACTION      Social History   Social History  . Marital status: Married    Spouse name: N/A  . Number of children: N/A  . Years of education: N/A   Occupational History  . Not on file.   Social History  Main Topics  . Smoking status: Never Smoker  . Smokeless tobacco: Former Systems developer    Types: Chew  . Alcohol use 1.2 oz/week    2 Cans of beer per week     Comment: on occasion  . Drug use: No  . Sexual activity: Yes   Other Topics Concern  . Not on file   Social History Narrative  . No narrative on file    Family History  Problem Relation Age of Onset  . Hypertension Mother   . Diabetes Father   . Cancer Maternal Grandmother     colon, liver  . Hypertension Maternal Grandmother   . Cancer Maternal Grandfather     prostate  . Hypertension Maternal Grandfather   . Anemia Paternal Grandmother   . Cancer Paternal Grandfather     leukemia  . Hypertension Maternal Aunt     No Known Allergies  Medication list reviewed and updated in full in [place].  GEN: No fevers, chills. Nontoxic. Primarily MSK c/o today. MSK: Detailed in the HPI GI: tolerating PO intake without difficulty Neuro: No numbness, parasthesias, or tingling associated. Otherwise the pertinent positives of the ROS are noted above.   Objective:   BP 122/80   Pulse 82   Temp 98.8 F (37.1 C) (Oral)   Ht 5\' 7"  (1.702 m)   Wt 255 lb 4  oz (115.8 kg)   BMI 39.98 kg/m    GEN: Well-developed,well-nourished,in no acute distress; alert,appropriate and cooperative throughout examination HEENT: Normocephalic and atraumatic without obvious abnormalities. Ears, externally no deformities PULM: Breathing comfortably in no respiratory distress EXT: No clubbing, cyanosis, or edema PSYCH: Normally interactive. Cooperative during the interview. Pleasant. Friendly and conversant. Not anxious or depressed appearing. Normal, full affect.  ANKLE: L Echymosis: no Edema: mild medially ROM: Full dorsi and plantar flexion, inversion, eversion Gait: heel toe, non-antalgic Lateral Mall: NT Medial Mall: NT Talus: NT Navicular: NT Cuboid: NT Calcaneous: NT Metatarsals: NT 5th MT: NT Phalanges: NT Achilles: NT,  downgoing thompsons Plantar Fascia: NT Fat Pad: NT Peroneals: NT Post Tib: mild TTP Great Toe: Nml motion Ant Drawer: neg Talar Tilt: neg ATFL: NT CFL: NT Deltoid: mild TTP Str: 5/5 Other Special tests: none Sensation: intact   Radiology: No results found.  Assessment and Plan:   Deltoid (ligament), ankle sprain, left, initial encounter  Tibialis posterior tendinitis, left  Clearly no achilles rupture on exam. Sprain + PT strain OK to start rehab and wean out of boot  ASO ankle brace given. Gently return to Caleb [name] next week  Follow-up: prn  Signed,  Amanie Mcculley T. Breven Guidroz, MD   Patient's Medications  New Prescriptions   No medications on file  Previous Medications   CLINDAMYCIN (CLEOCIN T) 1 % LOTION    Apply topically 2 (two) times daily as needed.    IBUPROFEN (ADVIL,MOTRIN) 200 MG TABLET    Take 200 mg by mouth.   LISINOPRIL (PRINIVIL,ZESTRIL) 10 MG TABLET    TAKE 1 TABLET (10 MG TOTAL) BY MOUTH DAILY.   MULTIVITAMIN (ONE-A-DAY MEN'S) TABS TABLET    Take 1 tablet by mouth daily.  Modified Medications   No medications on file  Discontinued Medications   No medications on file

## 2016-06-27 ENCOUNTER — Ambulatory Visit: Payer: 59 | Admitting: Podiatry

## 2016-07-27 ENCOUNTER — Other Ambulatory Visit: Payer: Self-pay | Admitting: Chiropractor

## 2016-07-27 ENCOUNTER — Ambulatory Visit
Admission: RE | Admit: 2016-07-27 | Discharge: 2016-07-27 | Disposition: A | Discharge status: Home / Self Care | Payer: 59 | Source: Ambulatory Visit | Attending: Chiropractor | Admitting: Chiropractor

## 2016-07-27 DIAGNOSIS — R52 Pain, unspecified: Secondary | ICD-10-CM

## 2016-07-27 DIAGNOSIS — R0781 Pleurodynia: Secondary | ICD-10-CM | POA: Insufficient documentation

## 2016-07-30 ENCOUNTER — Ambulatory Visit: Payer: 59 | Admitting: Dietician

## 2016-07-30 ENCOUNTER — Ambulatory Visit: Admission: RE | Admit: 2016-07-30 | Payer: 59 | Source: Ambulatory Visit

## 2016-07-30 ENCOUNTER — Ambulatory Visit
Admission: RE | Admit: 2016-07-30 | Discharge: 2016-07-30 | Disposition: A | Discharge status: Home / Self Care | Payer: 59 | Source: Ambulatory Visit | Attending: Family Medicine | Admitting: Family Medicine

## 2016-07-30 ENCOUNTER — Ambulatory Visit (INDEPENDENT_AMBULATORY_CARE_PROVIDER_SITE_OTHER): Payer: 59 | Admitting: Family Medicine

## 2016-07-30 ENCOUNTER — Encounter: Payer: Self-pay | Admitting: Family Medicine

## 2016-07-30 ENCOUNTER — Telehealth: Payer: Self-pay

## 2016-07-30 VITALS — BP 110/74 | HR 73 | Temp 98.5°F | Ht 67.0 in | Wt 240.5 lb

## 2016-07-30 DIAGNOSIS — R1084 Generalized abdominal pain: Secondary | ICD-10-CM

## 2016-07-30 DIAGNOSIS — Z23 Encounter for immunization: Secondary | ICD-10-CM

## 2016-07-30 DIAGNOSIS — S3991XA Unspecified injury of abdomen, initial encounter: Secondary | ICD-10-CM | POA: Diagnosis not present

## 2016-07-30 DIAGNOSIS — K760 Fatty (change of) liver, not elsewhere classified: Secondary | ICD-10-CM | POA: Insufficient documentation

## 2016-07-30 MED ORDER — IOPAMIDOL (ISOVUE-370) INJECTION 76%
100.0000 mL | Freq: Once | INTRAVENOUS | Status: AC | PRN
  Administered 2016-07-30: 100 mL via INTRAVENOUS

## 2016-07-30 NOTE — Telephone Encounter (Signed)
See notes

## 2016-07-30 NOTE — Patient Instructions (Signed)
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
## 2016-07-30 NOTE — Progress Notes (Signed)
Pre visit review using our clinic review tool, if applicable. No additional management support is needed unless otherwise documented below in the visit note. 

## 2016-07-30 NOTE — Telephone Encounter (Signed)
[place] with [facility] called report for CT abdomen/pelvis. Taken to Dr Lorelei Pont and report in [place].

## 2016-08-06 ENCOUNTER — Encounter: Payer: Self-pay | Admitting: Dietician

## 2016-08-06 NOTE — Progress Notes (Signed)
Patient cancelled his appointment for 07/30/16, stating he is doing well and does not need to reschedule. Sent discharge letter to PCP.

## 2016-08-13 ENCOUNTER — Ambulatory Visit (INDEPENDENT_AMBULATORY_CARE_PROVIDER_SITE_OTHER): Payer: 59 | Admitting: Family Medicine

## 2016-08-13 ENCOUNTER — Encounter: Payer: Self-pay | Admitting: Family Medicine

## 2016-08-13 VITALS — BP 132/92 | HR 75 | Temp 98.0°F | Wt 236.5 lb

## 2016-08-13 DIAGNOSIS — E669 Obesity, unspecified: Secondary | ICD-10-CM | POA: Diagnosis not present

## 2016-08-13 DIAGNOSIS — E785 Hyperlipidemia, unspecified: Secondary | ICD-10-CM | POA: Diagnosis not present

## 2016-08-13 DIAGNOSIS — I10 Essential (primary) hypertension: Secondary | ICD-10-CM

## 2016-08-13 LAB — COMPREHENSIVE METABOLIC PANEL
ALT: 18 U/L (ref 0–53)
AST: 14 U/L (ref 0–37)
Albumin: 4.2 g/dL (ref 3.5–5.2)
Alkaline Phosphatase: 106 U/L (ref 39–117)
BILIRUBIN TOTAL: 0.7 mg/dL (ref 0.2–1.2)
BUN: 15 mg/dL (ref 6–23)
CO2: 29 meq/L (ref 19–32)
Calcium: 9.5 mg/dL (ref 8.4–10.5)
Chloride: 105 mEq/L (ref 96–112)
Creatinine, Ser: 0.8 mg/dL (ref 0.40–1.50)
GFR: 115.44 mL/min (ref >60.00)
GLUCOSE: 95 mg/dL (ref 70–99)
POTASSIUM: 4.6 meq/L (ref 3.5–5.1)
SODIUM: 139 meq/L (ref 135–145)
Total Protein: 6.4 g/dL (ref 6.0–8.3)

## 2016-08-13 LAB — LIPID PANEL
CHOL/HDL RATIO: 4
Cholesterol: 147 mg/dL (ref 0–200)
HDL: 40.5 mg/dL (ref >39.00)
LDL CALC: 89 mg/dL (ref 0–99)
NonHDL: 106.53
TRIGLYCERIDES: 89 mg/dL (ref 0.0–149.0)
VLDL: 17.8 mg/dL (ref 0.0–40.0)

## 2016-08-13 LAB — HEMOGLOBIN A1C: Hgb A1c MFr Bld: 5.3 % (ref 4.6–6.5)

## 2016-08-13 LAB — TSH: TSH: 1.97 u[IU]/mL (ref 0.35–4.50)

## 2016-08-13 NOTE — Assessment & Plan Note (Signed)
Stable. Continue lisinopril

## 2016-08-13 NOTE — Assessment & Plan Note (Signed)
Patient has lost a lot of weight. Congratulated on weight loss. Advise continued dietary changes and exercise. Patient doing extremely well at this time.

## 2016-08-13 NOTE — Patient Instructions (Signed)
Continue your meds.  We will check labs and call you with the results.  Follow up in 6 months.  Take care  Dr. Lacinda Axon

## 2016-08-13 NOTE — Assessment & Plan Note (Signed)
Stable. Rechecking lipid panel today.

## 2016-08-13 NOTE — Progress Notes (Signed)
   Subjective:  Patient ID: Caleb [name], male    DOB: Nov 03, 1980  Age: 35 y.o. MRN: [id number]  CC: Follow up  HPI:  35 year old male with OSA, obesity, hypertension, hyperlipidemia presents for follow up.  HTN  Stable on Lisinopril.   HLD  Has been mildly elevated.  Has lost a significant amount of weight.  Would like to recheck today.  Obesity  At our last visit, patient was morbidly obese.  His has weight has been 292 per the chart.  He has been exercising regularly [employer]nel [employer]) and following Paleo diet.  Weight today is 236.   Social Hx   Social History   Social History  . Marital status: Married    Spouse name: N/A  . Number of children: N/A  . Years of education: N/A   Social History Main Topics  . Smoking status: Never Smoker  . Smokeless tobacco: Former Systems developer    Types: Chew  . Alcohol use 1.2 oz/week    2 Cans of beer per week     Comment: on occasion  . Drug use: No  . Sexual activity: Yes   Other Topics Concern  . None   Social History Narrative  . None   Review of Systems  Constitutional: Negative.   Respiratory: Negative.   Cardiovascular: Negative.    Objective:  BP (!) 132/92 (BP Location: Right Arm, Patient Position: Sitting, Cuff Size: Large)   Pulse 75   Temp 98 F (36.7 C) (Oral)   Wt 236 lb 8 oz (107.3 kg)   SpO2 98%   BMI 37.04 kg/m   BP/Weight 08/13/2016 07/30/2016 6/50/3546  Systolic BP 568 127 517  Diastolic BP 92 74 80  Wt. (Lbs) 236.5 240.5 255.25  BMI 37.04 37.67 39.98   Physical Exam  Constitutional: He is oriented to person, place, and time. He appears well-developed. No distress.  Cardiovascular: Normal rate and regular rhythm.   Pulmonary/Chest: Effort normal and breath sounds normal.  Neurological: He is alert and oriented to person, place, and time.  Psychiatric: He has a normal mood and affect.  Vitals reviewed.  Lab Results  Component Value Date   WBC 8.6 02/21/2016   HCT 46.6 02/21/2016   PLT 288 02/21/2016   GLUCOSE 89 02/21/2016   CHOL 177 02/21/2016   TRIG 127 02/21/2016   HDL 35 (L) 02/21/2016   LDLCALC 117 (H) 02/21/2016   ALT 29 02/21/2016   AST 17 02/21/2016   NA 139 02/21/2016   K 4.1 02/21/2016   CL 99 02/21/2016   CREATININE 0.90 02/21/2016   BUN 9 02/21/2016   CO2 24 02/21/2016   TSH 2.150 02/21/2016   HGBA1C 5.5 12/05/2015    Assessment & Plan:   Problem List Items Addressed This Visit    Obesity (BMI 30-39.9)    Patient has lost a lot of weight. Congratulated on weight loss. Advise continued dietary changes and exercise. Patient doing extremely well at this time.      Relevant Orders   HgB A1c   TSH   Hypertension - Primary    Stable. Continue lisinopril.      Relevant Orders   Comp Met (CMET)   Hyperlipidemia    Stable. Rechecking lipid panel today.       Relevant Orders   Lipid Profile    Other Visit Diagnoses   None.    Follow-up: Return in about 6 months (around 02/11/2017).  [place]

## 2016-08-13 NOTE — Progress Notes (Signed)
Pre visit review using our clinic review tool, if applicable. No additional management support is needed unless otherwise documented below in the visit note. 

## 2016-09-04 ENCOUNTER — Telehealth: Payer: Self-pay | Admitting: Family Medicine

## 2016-09-04 NOTE — Telephone Encounter (Signed)
Pt called stating that he needed to speak with someone regarding a bill that he received for lab work. Pt stated that his labs are supposed to go to [facility], I asked pt if he told the person drawing his labs that it needed to go to [facility] stated that yes he did and said the person drawing his labs said it didn't matter. Pt stated that his wife works for [employer] and that they need to be sent there. I dicussed briefly with Lorriane Shire and told the pt that we would look into and call him back.

## 2016-09-04 NOTE — Telephone Encounter (Signed)
[facility]

## 2016-09-04 NOTE — Telephone Encounter (Signed)
I spoke with lab personnel in our office they did inform me that they do express to the patient that if they have any other insurance other than [facility] insurance that it would not matter whether there spouse was a Freight forwarder or not. The patient thought that something else had changed with our office taking [facility] employees's. I informed him that we do send labs to [facility] ,but we do not render the service of giving labs for fee to [employer] [facility] employee's spouses unless they have [facility] insurance other than that we will bill the insurance on file. He has not yet received a bill from [facility] billing for the lab service. He did stat that he may be billed 50 dollars for the labs that were drawn, where it would have been 0 in the past if done at [facility]. I did inform him that it may be less if [facility] does a contractural write off with his insurance. He also stated that we should have better communication with [facility] patient's . I informed we do have signs that are posted in our lab and we will post them in the rooms also regarding [facility] employee's and where they can have there labs drawn.

## 2016-10-20 ENCOUNTER — Other Ambulatory Visit: Payer: Self-pay | Admitting: Unknown Physician Specialty

## 2016-12-28 ENCOUNTER — Ambulatory Visit (INDEPENDENT_AMBULATORY_CARE_PROVIDER_SITE_OTHER): Payer: 59 | Admitting: Family Medicine

## 2016-12-28 ENCOUNTER — Encounter: Payer: Self-pay | Admitting: Family Medicine

## 2016-12-28 VITALS — BP 130/82 | HR 66 | Temp 98.1°F | Wt 235.4 lb

## 2016-12-28 DIAGNOSIS — M2550 Pain in unspecified joint: Secondary | ICD-10-CM | POA: Diagnosis not present

## 2016-12-28 MED ORDER — TRAMADOL HCL 50 MG PO TABS: 50.0000 mg | ORAL_TABLET | Freq: Three times a day (TID) | ORAL | 0 refills | Status: DC | PRN

## 2016-12-28 NOTE — Assessment & Plan Note (Addendum)
New problem. Uncertain etiology/prognosis at this time. I feel that this is likely secondary to fatigue and overtraining. However, I cannot rule out underlying rheumatologic disorder. Will start workup with labs today. See orders. Treating pain with tramadol.

## 2016-12-28 NOTE — Progress Notes (Signed)
Subjective:  Patient ID: Caleb [name], male    DOB: 1981/10/20  Age: 36 y.o. MRN: [id number]  CC: Joint pain  HPI:  36 year old male presents with complaints of joint pain.  Patient has been working out heavily  in attempt to lose weight. He has done quite well. He is working out 2-3 hours a day, 6 days a week. He is doing [employer], lifting. He states that he's been doing well until the past month. He states that over the past month he's had significant joint pain. He states that he has second pain in his ankles, knees, shoulders, and elbows. No associated swelling. Pain is mild to moderate in severity. Seems to be worse in the morning. No reports of redness/swelling/synovitis. He has stopped working out briefly with some improvement but then it recurs. Possibly exacerbated by activity. No other associated symptoms. No other complaints or concerns at this time.  Social Hx   Social History   Social History  . Marital status: Married    Spouse name: N/A  . Number of children: N/A  . Years of education: N/A   Social History Main Topics  . Smoking status: Never Smoker  . Smokeless tobacco: Former Systems developer    Types: Chew  . Alcohol use 1.2 oz/week    2 Cans of beer per week     Comment: on occasion  . Drug use: No  . Sexual activity: Yes   Other Topics Concern  . None   Social History Narrative  . None    Review of Systems  Constitutional: Negative.   Musculoskeletal: Positive for arthralgias.   Objective:  BP 130/82   Pulse 66   Temp 98.1 F (36.7 C) (Oral)   Wt 235 lb 6 oz (106.8 kg)   SpO2 96%   BMI 36.86 kg/m   BP/Weight 12/28/2016 08/13/2016 24/02/8098  Systolic BP 833 825 053  Diastolic BP 82 92 74  Wt. (Lbs) 235.38 236.5 240.5  BMI 36.86 37.04 37.67    Physical Exam  Constitutional: He appears well-developed. No distress.  Cardiovascular: Normal rate and regular rhythm.   Pulmonary/Chest: Effort normal and breath sounds normal.    Musculoskeletal:  Normal range of motion of the knees, elbows, ankles, and shoulders. Muscle strength normal. No discrete areas of tenderness. No evidence of synovitis.  Neurological: He is alert.  Psychiatric: He has a normal mood and affect.  Vitals reviewed.   Lab Results  Component Value Date   WBC 8.6 02/21/2016   HCT 46.6 02/21/2016   PLT 288 02/21/2016   GLUCOSE 95 08/13/2016   CHOL 147 08/13/2016   TRIG 89.0 08/13/2016   HDL 40.50 08/13/2016   LDLCALC 89 08/13/2016   ALT 18 08/13/2016   AST 14 08/13/2016   NA 139 08/13/2016   K 4.6 08/13/2016   CL 105 08/13/2016   CREATININE 0.80 08/13/2016   BUN 15 08/13/2016   CO2 29 08/13/2016   TSH 1.97 08/13/2016   HGBA1C 5.3 08/13/2016    Assessment & Plan:   Problem List Items Addressed This Visit    Polyarthralgia - Primary    New problem. Uncertain etiology/prognosis at this time. I feel that this is likely secondary to fatigue and overtraining. However, I cannot rule out underlying rheumatologic disorder. Will start workup with labs today. See orders. Treating pain with tramadol.      Relevant Orders   CBC   Comprehensive metabolic panel   TSH   Sed Rate (ESR)  Rheumatoid factor   ANA   CYCLIC CITRUL PEPTIDE ANTIBODY, IGG/IGA      Meds ordered this encounter  Medications  . traMADol (ULTRAM) 50 MG tablet    Sig: Take 1 tablet (50 mg total) by mouth every 8 (eight) hours as needed.    Dispense:  30 tablet    Refill:  0    Follow-up: Pending workup.  [place]

## 2016-12-28 NOTE — Progress Notes (Signed)
Pre visit review using our clinic review tool, if applicable. No additional management support is needed unless otherwise documented below in the visit note. 

## 2016-12-28 NOTE — Patient Instructions (Signed)
We will call with your labs.  Tramadol as needed for pain.  Cut back on the exercise.  Take care  Dr. Lacinda Axon

## 2016-12-30 LAB — COMPREHENSIVE METABOLIC PANEL
A/G RATIO: 2 (ref 1.2–2.2)
ALK PHOS: 104 IU/L (ref 39–117)
ALT: 29 IU/L (ref 0–44)
AST: 15 IU/L (ref 0–40)
Albumin: 4.3 g/dL (ref 3.5–5.5)
BUN/Creatinine Ratio: 22 — ABNORMAL HIGH (ref 9–20)
BUN: 15 mg/dL (ref 6–20)
Bilirubin Total: 0.5 mg/dL (ref 0.0–1.2)
CALCIUM: 9.5 mg/dL (ref 8.7–10.2)
CO2: 25 mmol/L (ref 18–29)
CREATININE: 0.69 mg/dL — AB (ref 0.76–1.27)
Chloride: 102 mmol/L (ref 96–106)
GFR calc Af Amer: 140 mL/min/{1.73_m2} (ref >59)
GFR, EST NON AFRICAN AMERICAN: 121 mL/min/{1.73_m2} (ref >59)
GLOBULIN, TOTAL: 2.1 g/dL (ref 1.5–4.5)
Glucose: 87 mg/dL (ref 65–99)
POTASSIUM: 5.2 mmol/L (ref 3.5–5.2)
SODIUM: 141 mmol/L (ref 134–144)
Total Protein: 6.4 g/dL (ref 6.0–8.5)

## 2016-12-30 LAB — RHEUMATOID FACTOR

## 2016-12-30 LAB — CYCLIC CITRUL PEPTIDE ANTIBODY, IGG/IGA: CYCLIC CITRULLIN PEPTIDE AB: 5 U (ref 0–19)

## 2016-12-30 LAB — CBC
HEMATOCRIT: 44.7 % (ref 37.5–51.0)
Hemoglobin: 16.4 g/dL (ref 13.0–17.7)
MCH: 34 pg — ABNORMAL HIGH (ref 26.6–33.0)
MCHC: 36.7 g/dL — AB (ref 31.5–35.7)
MCV: 93 fL (ref 79–97)
PLATELETS: 242 10*3/uL (ref 150–379)
RBC: 4.83 x10E6/uL (ref 4.14–5.80)
RDW: 12.3 % (ref 12.3–15.4)
WBC: 6.8 10*3/uL (ref 3.4–10.8)

## 2016-12-30 LAB — ANA: ANA: NEGATIVE

## 2016-12-30 LAB — TSH: TSH: 1.91 u[IU]/mL (ref 0.450–4.500)

## 2016-12-30 LAB — SEDIMENTATION RATE: SED RATE: 2 mm/h (ref 0–15)

## 2017-01-22 ENCOUNTER — Telehealth: Payer: Self-pay | Admitting: Family Medicine

## 2017-01-22 NOTE — Telephone Encounter (Signed)
Form placed in provider's office.

## 2017-01-22 NOTE — Telephone Encounter (Signed)
Patient dropped off some paperwork for Dr Jeananne Rama to fill out and call him when he completes it. Put in Dr Durenda Age basket  Thanks  [phone]

## 2017-02-15 ENCOUNTER — Ambulatory Visit: Payer: 59 | Admitting: Family Medicine

## 2017-03-06 ENCOUNTER — Institutional Professional Consult (permissible substitution): Payer: 59 | Admitting: Family Medicine

## 2017-05-01 ENCOUNTER — Other Ambulatory Visit: Payer: Self-pay | Admitting: Unknown Physician Specialty

## 2017-05-23 ENCOUNTER — Encounter: Payer: Self-pay | Admitting: Family Medicine

## 2017-05-23 ENCOUNTER — Ambulatory Visit (INDEPENDENT_AMBULATORY_CARE_PROVIDER_SITE_OTHER): Payer: 59 | Admitting: Family Medicine

## 2017-05-23 VITALS — BP 114/86 | HR 77 | Temp 98.5°F | Ht 67.0 in | Wt 250.8 lb

## 2017-05-23 DIAGNOSIS — M6289 Other specified disorders of muscle: Secondary | ICD-10-CM | POA: Diagnosis not present

## 2017-05-23 DIAGNOSIS — M76899 Other specified enthesopathies of unspecified lower limb, excluding foot: Secondary | ICD-10-CM

## 2017-05-23 MED ORDER — DICLOFENAC SODIUM 75 MG PO TBEC: 75.0000 mg | DELAYED_RELEASE_TABLET | Freq: Two times a day (BID) | ORAL | 1 refills | Status: DC

## 2017-05-23 NOTE — Progress Notes (Signed)
Dr. Frederico Hamman T. Donn Zanetti, MD, [facility] [place], [address] Phone: [phone] Fax: [phone]  05/23/2017  Patient: Caleb [name], MRN: [id number], DOB: [birth year], 36 y.o.  Primary Physician:  Coral Spikes, DO   Chief Complaint  Patient presents with  . Spasms    Leg cramps   Subjective:   Caleb [name] is a 36 y.o. very pleasant male patient who presents with the following:  Muscle aspasms last year, when starting. BJJ 2-3 times a week Krav maga: 3-5 times a week 3 days a week, weights and cardio training.   The patient began grappling approximately 18 months ago, and at that point he was close to 300 pounds.  Training is roughly on the schedule as above, with multiple days a week training twice a day at least doing an hour to an hour and a half worth of high intensity grappling once or twice a day plus or minus weights and cardio.  Primary area of pain is in the anterior and anteromedial thigh.  He does have some generalized muscle aches and joint aches virtually throughout his body.  Past Medical History, Surgical History, Social History, Family History, Problem List, Medications, and Allergies have been reviewed and updated if relevant.  Patient Active Problem List   Diagnosis Date Noted  . Polyarthralgia 12/28/2016  . Pain in both feet 12/04/2015  . Obesity (BMI 30-39.9) 12/04/2015  . Preventative health care 12/04/2015  . Hypertension 06/16/2015  . Hyperlipidemia 06/16/2015  . Obstructive sleep apnea 06/16/2015    Past Medical History:  Diagnosis Date  . Chicken pox   . Hyperlipidemia   . Hypertension   . Obesity   . Obstructive sleep apnea     Past Surgical History:  Procedure Laterality Date  . WISDOM TOOTH EXTRACTION      Social History   Social History  . Marital status: Married    Spouse name: N/A  . Number of children: N/A  . Years of education: N/A   Occupational History    . Not on file.   Social History Main Topics  . Smoking status: Never Smoker  . Smokeless tobacco: Former Systems developer    Types: Chew  . Alcohol use 1.2 oz/week    2 Cans of beer per week     Comment: on occasion  . Drug use: No  . Sexual activity: Yes   Other Topics Concern  . Not on file   Social History Narrative  . No narrative on file    Family History  Problem Relation Age of Onset  . Hypertension Mother   . Diabetes Father   . Cancer Maternal Grandmother        colon, liver  . Hypertension Maternal Grandmother   . Cancer Maternal Grandfather        prostate  . Hypertension Maternal Grandfather   . Anemia Paternal Grandmother   . Cancer Paternal Grandfather        leukemia  . Hypertension Maternal Aunt     No Known Allergies  Medication list reviewed and updated in full in [place].  GEN: No fevers, chills. Nontoxic. Primarily MSK c/o today. MSK: Detailed in the HPI GI: tolerating PO intake without difficulty Neuro: No numbness, parasthesias, or tingling associated. Otherwise the pertinent positives of the ROS are noted above.   Objective:   BP 114/86   Pulse 77   Temp 98.5 F (36.9 C) (Oral)  Ht 5\' 7"  (1.702 m)   Wt 250 lb 12 oz (113.7 kg)   BMI 39.27 kg/m    GEN: WDWN, NAD, Non-toxic, Alert & Oriented x 3 HEENT: Atraumatic, Normocephalic.  Ears and Nose: No external deformity. EXTR: No clubbing/cyanosis/edema NEURO: Normal gait.  PSYCH: Normally interactive. Conversant. Not depressed or anxious appearing.  Calm demeanor.    Good range of motion in the shoulders with no impingement, normal crossover, strength in all directions bilaterally is 5/5.  No synovitis in the wrists or hands bilaterally.  Appropriate grip strength.  HIP EXAM: SIDE: B ROM: Abduction, Flexion, Internal and External range of motion: full Pain with terminal IROM and EROM: minimal GTB: NT SLR: NEG Knees: No effusion FABER: NT REVERSE FABER: NT, neg Piriformis: NT  at direct palpation Str: flexion: 4/5, pain abduction: 5/5 adduction: 4/5, pain  Radiology: No results found.  Assessment and Plan:   Muscle fatigue  Hip flexor tendinitis, unspecified laterality  >25 minutes spent in face to face time with patient, >50% spent in counselling or coordination of care   He does have some bilateral hip flexor tendinopathy as well as hip abductor tendinopathy, overuse from some of the classic jiu jitsu positions. Reviewed some rehab with this.  His primary issue is that he is significantly overtraining at a volume that the vast majority of 38 year old men could not do, let alone a 36 year old man with a BMI of 40.  He also is not consuming enough calories for the amount of exercise that he is doing, inhibiting his recovery.  I tried to talk about both of these things with him.  At the very least, when he is feeling significantly worn down and having pain, recommended that he take a couple of days off for recovery.  Follow-up: prn only  Meds ordered this encounter  Medications  . diclofenac (VOLTAREN) 75 MG EC tablet    Sig: Take 1 tablet (75 mg total) by mouth 2 (two) times daily.    Dispense:  60 tablet    Refill:  1   Signed,  Schuyler Olden T. Ignazio Kincaid, MD   Allergies as of 05/23/2017   No Known Allergies     Medication List       Accurate as of 05/23/17 11:59 PM. Always use your most recent med list.          clindamycin 1 % lotion Commonly known as:  CLEOCIN T Apply topically 2 (two) times daily as needed.   diclofenac 75 MG EC tablet Commonly known as:  VOLTAREN Take 1 tablet (75 mg total) by mouth 2 (two) times daily.   ibuprofen 200 MG tablet Commonly known as:  ADVIL,MOTRIN Take 200 mg by mouth.   lisinopril 10 MG tablet Commonly known as:  PRINIVIL,ZESTRIL TAKE 1 TABLET (10 MG TOTAL) BY MOUTH DAILY.   multivitamin Tabs tablet Take 1 tablet by mouth daily.   traMADol 50 MG tablet Commonly known as:  ULTRAM Take 1 tablet  (50 mg total) by mouth every 8 (eight) hours as needed.

## 2017-10-23 ENCOUNTER — Ambulatory Visit (INDEPENDENT_AMBULATORY_CARE_PROVIDER_SITE_OTHER): Payer: 59 | Admitting: Family Medicine

## 2017-10-23 ENCOUNTER — Encounter: Payer: Self-pay | Admitting: Family Medicine

## 2017-10-23 VITALS — BP 110/74 | HR 89 | Temp 98.5°F | Ht 67.0 in | Wt 268.2 lb

## 2017-10-23 DIAGNOSIS — R509 Fever, unspecified: Secondary | ICD-10-CM | POA: Diagnosis not present

## 2017-10-23 DIAGNOSIS — R52 Pain, unspecified: Secondary | ICD-10-CM | POA: Diagnosis not present

## 2017-10-23 DIAGNOSIS — J101 Influenza due to other identified influenza virus with other respiratory manifestations: Secondary | ICD-10-CM | POA: Diagnosis not present

## 2017-10-23 LAB — POC INFLUENZA A&B (BINAX/QUICKVUE)
Influenza A, POC: POSITIVE — AB
Influenza B, POC: NEGATIVE

## 2017-10-23 MED ORDER — OSELTAMIVIR PHOSPHATE 75 MG PO CAPS: 75.0000 mg | ORAL_CAPSULE | Freq: Two times a day (BID) | ORAL | 0 refills | Status: DC

## 2017-10-23 NOTE — Progress Notes (Signed)
Dr. Frederico Hamman T. Chalese Peach, MD, [facility] [place], [address] Phone: [phone] Fax: [phone]  10/23/2017  Patient: Caleb [name], MRN: [id number], DOB: 02-10-1981, 37 y.o.  Primary Physician:  Coral Spikes, DO   Chief Complaint  Patient presents with  . Generalized Body Aches    Child at home with RSV  . Chills  . Fever  . Sore Throat   Subjective:   Caleb [name] presents with runny nose, sneezing, cough, sore throat, malaise, myalgias, arthralgia, chills, and fever.  4 PM - felt really cold and achy. Having trouble breathing.  + flu shot this year.   ? recent exposure to others with similar symptoms.   The patent denies sore throat as the primary complaint. Denies sthortness of breath/wheezing, otalgia, facial pain, abdominal pain, changes in bowel or bladder.  Generally feels terrible  Tmax: ?  PMH, PHS, Allergies, Problem List, Medications, Family History, and Social History have all been reviewed.  Patient Active Problem List   Diagnosis Date Noted  . Polyarthralgia 12/28/2016  . Pain in both feet 12/04/2015  . Obesity (BMI 30-39.9) 12/04/2015  . Preventative health care 12/04/2015  . Hypertension 06/16/2015  . Hyperlipidemia 06/16/2015  . Obstructive sleep apnea 06/16/2015    Past Medical History:  Diagnosis Date  . Chicken pox   . Hyperlipidemia   . Hypertension   . Obesity   . Obstructive sleep apnea     Past Surgical History:  Procedure Laterality Date  . WISDOM TOOTH EXTRACTION      Social History   Socioeconomic History  . Marital status: Married    Spouse name: Not on file  . Number of children: Not on file  . Years of education: Not on file  . Highest education level: Not on file  Social Needs  . Financial resource strain: Not on file  . Food insecurity - worry: Not on file  . Food insecurity - inability: Not on file  . Transportation needs - medical: Not on file    . Transportation needs - non-medical: Not on file  Occupational History  . Not on file  Tobacco Use  . Smoking status: Never Smoker  . Smokeless tobacco: Former Systems developer    Types: Chew  Substance and Sexual Activity  . Alcohol use: Yes    Alcohol/week: 1.2 oz    Types: 2 Cans of beer per week    Comment: on occasion  . Drug use: No  . Sexual activity: Yes  Other Topics Concern  . Not on file  Social History Narrative  . Not on file    Family History  Problem Relation Age of Onset  . Hypertension Mother   . Diabetes Father   . Cancer Maternal Grandmother        colon, liver  . Hypertension Maternal Grandmother   . Cancer Maternal Grandfather        prostate  . Hypertension Maternal Grandfather   . Anemia Paternal Grandmother   . Cancer Paternal Grandfather        leukemia  . Hypertension Maternal Aunt     No Known Allergies  Medication list reviewed and updated in full in [place].  ROS as above, eating and drinking - tolerating PO. Urinating normally. No excessive vomitting or diarrhea.   Objective:   Blood pressure 110/74, pulse 89, temperature 98.5 F (36.9 C), temperature source Oral, height 5\' 7"  (1.702 m), weight 268  lb 4 oz (121.7 kg), SpO2 95 %.  Gen: WDWN, NAD; A & O x3, cooperative. Pleasant.Globally Non-toxic HEENT: Normocephalic and atraumatic. Throat clear, w/o exudate, R TM clear, L TM - good landmarks, No fluid present. rhinnorhea. No frontal or maxillary sinus T. MMM NECK: Anterior cervical  LAD is absent CV: RRR, No M/G/R, cap refill <2 sec PULM: Breathing comfortably in no respiratory distress. no wheezing, crackles, rhonchi ABD: S,NT,ND,+BS. No HSM. No rebound. EXT: No c/c/e PSYCH: Friendly, good eye contact MSK: Nml gait  Results for orders placed or performed in visit on 10/23/17  POC Influenza A&B (Binax test)  Result Value Ref Range   Influenza A, POC Positive (A) Negative   Influenza B, POC Negative Negative    Assessment  and Plan:   Influenza A  Fever, unspecified fever cause - Plan: POC Influenza A&B (Binax test)  Body aches - Plan: POC Influenza A&B (Binax test)  The patient's clinical exam and history is consistent with a diagnosis of influenza. Flu A on test  Supportive care, fluids, cough medicines as needed, and anti-pyretics. Infection control emphasized, including OOW or school until AF 24 hours.  Follow-up: No Follow-up on file.  Meds ordered this encounter  Medications  . oseltamivir (TAMIFLU) 75 MG capsule    Sig: Take 1 capsule (75 mg total) by mouth 2 (two) times daily.    Dispense:  10 capsule    Refill:  0   Orders Placed This Encounter  Procedures  . POC Influenza A&B (Binax test)    Signed,  Dashea Mcmullan T. Karlo Goeden, MD     Medication List        Accurate as of 10/23/17  1:38 PM. Always use your most recent med list.          clindamycin 1 % lotion Commonly known as:  CLEOCIN T   diclofenac 75 MG EC tablet Commonly known as:  VOLTAREN Take 1 tablet (75 mg total) by mouth 2 (two) times daily.   ibuprofen 200 MG tablet Commonly known as:  ADVIL,MOTRIN   lisinopril 10 MG tablet Commonly known as:  PRINIVIL,ZESTRIL TAKE 1 TABLET (10 MG TOTAL) BY MOUTH DAILY.   multivitamin Tabs tablet   oseltamivir 75 MG capsule Commonly known as:  TAMIFLU Take 1 capsule (75 mg total) by mouth 2 (two) times daily.       Where to Get Your Medications    These medications were sent to [id number] - [place], [place], [place] [address]   Phone:  [phone]   oseltamivir 75 MG capsule

## 2017-11-26 ENCOUNTER — Telehealth: Payer: Self-pay

## 2017-11-26 NOTE — Telephone Encounter (Signed)
Please advise 

## 2017-11-26 NOTE — Telephone Encounter (Signed)
Reason for call: muscle spasm  Symptoms: muscle spasm , from waist down, both legs happens at night while sleeping  Duration over year worsening  Medications:  Last seen for this problem: Seen by: Dr Ria Comment office , Former Dr Lacinda Axon patient  Would you be willing to work in sooner for acute issue only ? Next NP 01/09/18

## 2017-11-26 NOTE — Telephone Encounter (Signed)
Copied from [place] [id number]. Topic: Referral - Request >> Nov 26, 2017 12:16 PM Cecelia Byars, NT wrote: Reason for CRM: Patient would like a referral to see a  neurologist due painful muscle spasms please advise

## 2017-11-26 NOTE — Telephone Encounter (Signed)
Left message to return call, need more info

## 2017-11-27 NOTE — Telephone Encounter (Signed)
Yes ok to work in   Thanks TMs

## 2017-11-27 NOTE — Telephone Encounter (Signed)
The patient has been scheduled

## 2017-11-27 NOTE — Telephone Encounter (Signed)
Spoke with patient he would like to come in on Friday 11/29/17 @ 1000 am . Would you please place patient in spot . Patient aware of appointment. Thanks

## 2017-11-29 ENCOUNTER — Encounter: Payer: Self-pay | Admitting: Internal Medicine

## 2017-11-29 ENCOUNTER — Ambulatory Visit (INDEPENDENT_AMBULATORY_CARE_PROVIDER_SITE_OTHER): Payer: 59

## 2017-11-29 ENCOUNTER — Ambulatory Visit (INDEPENDENT_AMBULATORY_CARE_PROVIDER_SITE_OTHER): Payer: 59 | Admitting: Internal Medicine

## 2017-11-29 VITALS — BP 170/80 | HR 83 | Temp 98.5°F | Ht 67.0 in | Wt 271.2 lb

## 2017-11-29 DIAGNOSIS — G4762 Sleep related leg cramps: Secondary | ICD-10-CM | POA: Diagnosis not present

## 2017-11-29 DIAGNOSIS — G2581 Restless legs syndrome: Secondary | ICD-10-CM

## 2017-11-29 DIAGNOSIS — R05 Cough: Secondary | ICD-10-CM

## 2017-11-29 DIAGNOSIS — G4733 Obstructive sleep apnea (adult) (pediatric): Secondary | ICD-10-CM

## 2017-11-29 DIAGNOSIS — R252 Cramp and spasm: Secondary | ICD-10-CM | POA: Diagnosis not present

## 2017-11-29 DIAGNOSIS — R059 Cough, unspecified: Secondary | ICD-10-CM

## 2017-11-29 MED ORDER — AZITHROMYCIN 250 MG PO TABS: ORAL_TABLET | ORAL | 0 refills | Status: DC

## 2017-11-29 NOTE — Patient Instructions (Addendum)
Will refer you to [facility] neurology in [place] for muscle contractures  Consider Baclofen 5-10 mg at night  Take Zpack 2 pills today and 1 pill day 2-5  Try Mucinex DM/Robitussin DM for cough  Try B complex vitamins for cramps   Muscle Cramps and Spasms Muscle cramps and spasms are when muscles tighten by themselves. They usually get better within minutes. Muscle cramps are painful. They are usually stronger and last longer than muscle spasms. Muscle spasms may or may not be painful. They can last a few seconds or much longer. Follow these instructions at home:  Drink enough fluid to keep your pee (urine) clear or pale yellow.  Massage, stretch, and relax the muscle.  If directed, apply heat to tight or tense muscles as often as told by your doctor. Use the heat source that your doctor recommends. ? Place a towel between your skin and the heat source. ? Leave the heat on for 20-30 minutes. ? Take off the heat if your skin turns bright red. This is especially important if you are unable to feel pain, heat, or cold. You may have a greater risk of getting burned.  If directed, put ice on the affected area. This may help if you are sore or have pain after a cramp or spasm. ? Put ice in a plastic bag. ? Place a towel between your skin and the bag. ? Leave the ice on for 20 minutes, 2-3 times a day.  Take over-the-counter and prescription medicines only as told by your doctor.  Pay attention to any changes in your symptoms. Contact a doctor if:  Your cramps or spasms get worse or happen more often.  Your cramps or spasms do not get better with time. This information is not intended to replace advice given to you by your health care provider. Make sure you discuss any questions you have with your health care provider. Document Released: 09/20/2008 Document Revised: 11/09/2015 Document Reviewed: 07/12/2015 Elsevier Interactive Patient Education  2018 [employer].

## 2017-11-29 NOTE — Progress Notes (Signed)
Pre visit review using our clinic review tool, if applicable. No additional management support is needed unless otherwise documented below in the visit note. 

## 2017-11-30 LAB — COMPREHENSIVE METABOLIC PANEL
A/G RATIO: 1.5 (ref 1.2–2.2)
ALBUMIN: 4 g/dL (ref 3.5–5.5)
ALK PHOS: 93 IU/L (ref 39–117)
ALT: 28 IU/L (ref 0–44)
AST: 18 IU/L (ref 0–40)
BILIRUBIN TOTAL: 0.3 mg/dL (ref 0.0–1.2)
BUN / CREAT RATIO: 13 (ref 9–20)
BUN: 12 mg/dL (ref 6–20)
CHLORIDE: 105 mmol/L (ref 96–106)
CO2: 25 mmol/L (ref 20–29)
CREATININE: 0.92 mg/dL (ref 0.76–1.27)
Calcium: 9.3 mg/dL (ref 8.7–10.2)
GFR calc Af Amer: 122 mL/min/{1.73_m2} (ref >59)
GFR calc non Af Amer: 105 mL/min/{1.73_m2} (ref >59)
Globulin, Total: 2.6 g/dL (ref 1.5–4.5)
Glucose: 90 mg/dL (ref 65–99)
POTASSIUM: 4.3 mmol/L (ref 3.5–5.2)
SODIUM: 143 mmol/L (ref 134–144)
Total Protein: 6.6 g/dL (ref 6.0–8.5)

## 2017-11-30 LAB — CBC WITH DIFFERENTIAL/PLATELET
Basophils Absolute: 0 10*3/uL (ref 0.0–0.2)
Basos: 0 %
EOS (ABSOLUTE): 0.2 10*3/uL (ref 0.0–0.4)
Eos: 2 %
HEMOGLOBIN: 14.9 g/dL (ref 13.0–17.7)
Hematocrit: 43.7 % (ref 37.5–51.0)
Immature Grans (Abs): 0.1 10*3/uL (ref 0.0–0.1)
Immature Granulocytes: 1 %
LYMPHS ABS: 2.3 10*3/uL (ref 0.7–3.1)
Lymphs: 25 %
MCH: 31 pg (ref 26.6–33.0)
MCHC: 34.1 g/dL (ref 31.5–35.7)
MCV: 91 fL (ref 79–97)
MONOS ABS: 0.7 10*3/uL (ref 0.1–0.9)
Monocytes: 7 %
NEUTROS ABS: 6.1 10*3/uL (ref 1.4–7.0)
Neutrophils: 65 %
Platelets: 316 10*3/uL (ref 150–379)
RBC: 4.81 x10E6/uL (ref 4.14–5.80)
RDW: 12.7 % (ref 12.3–15.4)
WBC: 9.4 10*3/uL (ref 3.4–10.8)

## 2017-11-30 LAB — URINALYSIS, ROUTINE W REFLEX MICROSCOPIC
BILIRUBIN UA: NEGATIVE
Glucose, UA: NEGATIVE
KETONES UA: NEGATIVE
Leukocytes, UA: NEGATIVE
NITRITE UA: NEGATIVE
PH UA: 5.5 (ref 5.0–7.5)
Protein, UA: NEGATIVE
RBC UA: NEGATIVE
SPEC GRAV UA: 1.025 (ref 1.005–1.030)
Urobilinogen, Ur: 0.2 mg/dL (ref 0.2–1.0)

## 2017-11-30 LAB — IRON,TIBC AND FERRITIN PANEL
Ferritin: 278 ng/mL (ref 30–400)
IRON SATURATION: 34 % (ref 15–55)
IRON: 86 ug/dL (ref 38–169)
Total Iron Binding Capacity: 255 ug/dL (ref 250–450)
UIBC: 169 ug/dL (ref 111–343)

## 2017-11-30 LAB — CK: Total CK: 324 U/L — ABNORMAL HIGH (ref 24–204)

## 2017-11-30 LAB — MAGNESIUM: MAGNESIUM: 2.2 mg/dL (ref 1.6–2.3)

## 2017-11-30 LAB — TSH: TSH: 1.08 u[IU]/mL (ref 0.450–4.500)

## 2017-11-30 LAB — T4, FREE: FREE T4: 1.16 ng/dL (ref 0.82–1.77)

## 2017-12-02 ENCOUNTER — Encounter: Payer: Self-pay | Admitting: Internal Medicine

## 2017-12-02 NOTE — Progress Notes (Signed)
Chief Complaint  Patient presents with  . Follow-up   Follow up  1. He c/o cough x 3 weeks h/o flu A this year and went to urgent care.  He tried wifes old Amoxicillin which caused itching/crawling sensation. He is coughing yellow, clear to gray phelgm.   2. C/o muscle spasms and cramps with or w/o exercise. This has been ongoing issue. At times he reports at night legs are like stiff boards and shock sensation, spasms and will take 1.5 hours to go away and he can barely move/walk.  He does practice mixed [employer] and does not think it effects spasms b/c they happen w/o exercise.  Muscle spasms are in both legs.  Muscles spasms from thighs until left foot and feel like knots. He denies low back pain but reports sensation like muscle contraction and cant bend/like electrocution. He has tried magnesium w/o relief. He is also drinking enough water.  3. OSA on CPAP.     Review of Systems  Constitutional: Negative for weight loss.  HENT: Negative for hearing loss.   Eyes:       No vision changes   Respiratory: Positive for cough and sputum production.   Cardiovascular: Negative for chest pain.  Gastrointestinal: Negative for abdominal pain.  Musculoskeletal: Negative for back pain.       +muscle spasm  Skin: Negative for rash.  Neurological: Positive for sensory change.  Psychiatric/Behavioral: Negative for memory loss.   Past Medical History:  Diagnosis Date  . Chicken pox   . Hyperlipidemia   . Hypertension   . Obesity   . Obstructive sleep apnea    Past Surgical History:  Procedure Laterality Date  . WISDOM TOOTH EXTRACTION     Family History  Problem Relation Age of Onset  . Hypertension Mother   . Diabetes Father   . Cancer Maternal Grandmother        colon, liver  . Hypertension Maternal Grandmother   . Cancer Maternal Grandfather        prostate  . Hypertension Maternal Grandfather   . Anemia Paternal Grandmother   . Cancer Paternal Grandfather        leukemia   . Hypertension Maternal Aunt    Social History   Socioeconomic History  . Marital status: Married    Spouse name: Not on file  . Number of children: Not on file  . Years of education: Not on file  . Highest education level: Not on file  Social Needs  . Financial resource strain: Not on file  . Food insecurity - worry: Not on file  . Food insecurity - inability: Not on file  . Transportation needs - medical: Not on file  . Transportation needs - non-medical: Not on file  Occupational History  . Not on file  Tobacco Use  . Smoking status: Never Smoker  . Smokeless tobacco: Former Systems developer    Types: Chew  Substance and Sexual Activity  . Alcohol use: Yes    Alcohol/week: 1.2 oz    Types: 2 Cans of beer per week    Comment: on occasion  . Drug use: No  . Sexual activity: Yes  Other Topics Concern  . Not on file  Social History Narrative   Works as Conservation officer, historic buildings x 16 years    Married    Current Meds  Medication Sig  . ibuprofen (ADVIL,MOTRIN) 200 MG tablet Take 200 mg by mouth.  Marland Kitchen lisinopril (PRINIVIL,ZESTRIL) 10 MG tablet TAKE 1 TABLET (  10 MG TOTAL) BY MOUTH DAILY.  . multivitamin (ONE-A-DAY MEN'S) TABS tablet Take 1 tablet by mouth daily.   Allergies  Allergen Reactions  . Amoxicillin     Itching/crawling sensation     Recent Results (from the past 2160 hour(s))  POC Influenza A&B (Binax test)     Status: Abnormal   Collection Time: 10/23/17 10:58 AM  Result Value Ref Range   Influenza A, POC Positive (A) Negative   Influenza B, POC Negative Negative  Urinalysis, Routine w reflex microscopic     Status: None   Collection Time: 11/29/17 10:49 AM  Result Value Ref Range   Specific Gravity, UA 1.025 1.005 - 1.030   pH, UA 5.5 5.0 - 7.5   Color, UA Yellow Yellow   Appearance Ur Clear Clear   Leukocytes, UA Negative Negative   Protein, UA Negative Negative/Trace   Glucose, UA Negative Negative   Ketones, UA Negative Negative   RBC, UA Negative  Negative   Bilirubin, UA Negative Negative   Urobilinogen, Ur 0.2 0.2 - 1.0 mg/dL   Nitrite, UA Negative Negative   Microscopic Examination Comment     Comment: Microscopic not indicated and not performed.  T4, free     Status: None   Collection Time: 11/29/17 10:49 AM  Result Value Ref Range   Free T4 1.16 0.82 - 1.77 ng/dL  Magnesium     Status: None   Collection Time: 11/29/17 10:49 AM  Result Value Ref Range   Magnesium 2.2 1.6 - 2.3 mg/dL  Iron, TIBC and Ferritin Panel     Status: None   Collection Time: 11/29/17 10:49 AM  Result Value Ref Range   Total Iron Binding Capacity 255 250 - 450 ug/dL   UIBC 169 111 - 343 ug/dL   Iron 86 38 - 169 ug/dL   Iron Saturation 34 15 - 55 %   Ferritin 278 30 - 400 ng/mL  Comp Met (CMET)     Status: None   Collection Time: 11/29/17 10:49 AM  Result Value Ref Range   Glucose 90 65 - 99 mg/dL   BUN 12 6 - 20 mg/dL   Creatinine, Ser 0.92 0.76 - 1.27 mg/dL   GFR calc non Af Amer 105 >59 mL/min/1.73   GFR calc Af Amer 122 >59 mL/min/1.73   BUN/Creatinine Ratio 13 9 - 20   Sodium 143 134 - 144 mmol/L   Potassium 4.3 3.5 - 5.2 mmol/L   Chloride 105 96 - 106 mmol/L   CO2 25 20 - 29 mmol/L   Calcium 9.3 8.7 - 10.2 mg/dL   Total Protein 6.6 6.0 - 8.5 g/dL   Albumin 4.0 3.5 - 5.5 g/dL   Globulin, Total 2.6 1.5 - 4.5 g/dL   Albumin/Globulin Ratio 1.5 1.2 - 2.2   Bilirubin Total 0.3 0.0 - 1.2 mg/dL   Alkaline Phosphatase 93 39 - 117 IU/L   AST 18 0 - 40 IU/L   ALT 28 0 - 44 IU/L  CK (Creatine Kinase)     Status: Abnormal   Collection Time: 11/29/17 10:49 AM  Result Value Ref Range   Total CK 324 (H) 24 - 204 U/L  CBC w/Diff     Status: None   Collection Time: 11/29/17 10:49 AM  Result Value Ref Range   WBC 9.4 3.4 - 10.8 x10E3/uL   RBC 4.81 4.14 - 5.80 x10E6/uL   Hemoglobin 14.9 13.0 - 17.7 g/dL   Hematocrit 43.7 37.5 - 51.0 %  MCV 91 79 - 97 fL   MCH 31.0 26.6 - 33.0 pg   MCHC 34.1 31.5 - 35.7 g/dL   RDW 12.7 12.3 - 15.4 %    Platelets 316 150 - 379 x10E3/uL   Neutrophils 65 Not Estab. %   Lymphs 25 Not Estab. %   Monocytes 7 Not Estab. %   Eos 2 Not Estab. %   Basos 0 Not Estab. %   Neutrophils Absolute 6.1 1.4 - 7.0 x10E3/uL   Lymphocytes Absolute 2.3 0.7 - 3.1 x10E3/uL   Monocytes Absolute 0.7 0.1 - 0.9 x10E3/uL   EOS (ABSOLUTE) 0.2 0.0 - 0.4 x10E3/uL   Basophils Absolute 0.0 0.0 - 0.2 x10E3/uL   Immature Granulocytes 1 Not Estab. %   Immature Grans (Abs) 0.1 0.0 - 0.1 x10E3/uL  TSH     Status: None   Collection Time: 11/29/17 10:49 AM  Result Value Ref Range   TSH 1.080 0.450 - 4.500 uIU/mL   Objective  Body mass index is 42.48 kg/m. Wt Readings from Last 3 Encounters:  11/29/17 271 lb 3.2 oz (123 kg)  10/23/17 268 lb 4 oz (121.7 kg)  05/23/17 250 lb 12 oz (113.7 kg)   Temp Readings from Last 3 Encounters:  11/29/17 98.5 F (36.9 C) (Oral)  10/23/17 98.5 F (36.9 C) (Oral)  05/23/17 98.5 F (36.9 C) (Oral)   BP Readings from Last 3 Encounters:  11/29/17 (!) 170/80  10/23/17 110/74  05/23/17 114/86   Pulse Readings from Last 3 Encounters:  11/29/17 83  10/23/17 89  05/23/17 77   O2 sat room air 97%  Physical Exam  Constitutional: He is oriented to person, place, and time and well-developed, well-nourished, and in no distress. Vital signs are normal.  HENT:  Head: Normocephalic and atraumatic.  Mouth/Throat: Oropharynx is clear and moist and mucous membranes are normal.  Eyes: Conjunctivae are normal. Pupils are equal, round, and reactive to light.  Cardiovascular: Normal rate, regular rhythm and normal heart sounds.  Pulmonary/Chest: Effort normal and breath sounds normal.  Abdominal: Soft. Bowel sounds are normal. There is no tenderness.  Neurological: He is alert and oriented to person, place, and time. Gait normal. Gait normal.  Skin: Skin is warm, dry and intact.  Psychiatric: Mood, memory, affect and judgment normal.  Nursing note and vitals reviewed.   Assessment   1.  Postviral cough dx'ed with flu 3+ weeks ago 10/23/17 Flu A+  2. Muscle cramps/spasms ? RLS vs other sleep paralysis vs other neurologic issue  3. OSA on cpap  4. HM Plan  1. CXR today, Zpack Robitussin/mucinex DM, supportive care  2. Check CMET, CBC, anemia panel, TSH, T4, CK, mag, UA Refer to neurology further w/u elevated CK ? 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 Cont CPAP  4. Had flu shot  Tdap UTD  Consider check hep B status in future   rec wt loss goal around 200s per pt   Provider: Dr. Olivia Mackie McLean-Scocuzza-Internal Medicine

## 2017-12-04 ENCOUNTER — Encounter: Payer: Self-pay | Admitting: Neurology

## 2017-12-04 ENCOUNTER — Ambulatory Visit (INDEPENDENT_AMBULATORY_CARE_PROVIDER_SITE_OTHER): Payer: 59 | Admitting: Neurology

## 2017-12-04 DIAGNOSIS — G4762 Sleep related leg cramps: Secondary | ICD-10-CM | POA: Diagnosis not present

## 2017-12-04 HISTORY — DX: Sleep related leg cramps: G47.62

## 2017-12-04 NOTE — Progress Notes (Signed)
Reason for visit: Muscle cramps  Referring physician: Dr. Larey Dresser Caleb [name] is a 37 y.o. male  History of present illness:  Caleb [name] is a 37 year old left-handed white male with a history of obesity and sleep apnea.  The patient reports some problems with leg cramps during his life, he had troubles when he was 37 years old when he was playing baseball.  He has recently gotten into a martial arts exercise program, he has lost over 100 pounds.  The patient over the last 6 months has started noticing episodes of muscle cramps involving the lower extremities that occur at nighttime only.  The patient has had severe episodes more recently and the episodes are occurring more frequently, at least once a month.  The episodes may last up to 90 minutes.  He indicates that the cramps oftentimes start in the left leg in the thigh, it may spread down to the lower leg on the left and then go across to the right leg unassociated with spasms in the back.  The patient may have shock sensations down both legs with the spasms.  The patient denies any weakness, he may have muscle soreness in both legs after the cramps.  He denies any problems controlling the bowels or the bladder.  The patient denies any balance issues.  He has no true numbness of the extremities.  He has not had cramps in the arms.  He is on 800 mg of magnesium at night, he tries to stay well-hydrated, he drinks very little alcohol.  He has had blood work done that shows a modest elevation of the CK enzyme level that was done 4 or 5 days after a bad episode of muscle cramps.  The patient had a normal thyroid study.  He is sent to this office for an evaluation.  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  The patient reports restless legs at night as well.  Past Medical History:  Diagnosis Date  . Chicken pox   . Hyperlipidemia   . Hypertension   . Nocturnal leg cramps 12/04/2017   . Obesity   . Obstructive sleep apnea     Past Surgical History:  Procedure Laterality Date  . WISDOM TOOTH EXTRACTION      Family History  Problem Relation Age of Onset  . Hypertension Mother   . Diabetes Father   . Cancer Maternal Grandmother        colon, liver  . Hypertension Maternal Grandmother   . Cancer Maternal Grandfather        prostate  . Hypertension Maternal Grandfather   . Anemia Paternal Grandmother   . Cancer Paternal Grandfather        leukemia  . Hypertension Maternal Aunt     Social history:  reports that  has never smoked. He has quit using smokeless tobacco. His smokeless tobacco use included chew. He reports that he drinks about 1.2 oz of alcohol per week. He reports that he does not use drugs.  Medications:  Prior to Admission medications   Medication Sig Start Date End Date Taking? Authorizing Provider  azithromycin (ZITHROMAX) 250 MG tablet 2 pills day 1, 1 pill day 2-5 11/29/17  Yes McLean-Scocuzza, Nino Glow, MD  ibuprofen (ADVIL,MOTRIN) 200 MG tablet Take 200 mg by mouth.   Yes [provider]  lisinopril (PRINIVIL,ZESTRIL) 10 MG tablet TAKE 1 TABLET (10 MG TOTAL) BY MOUTH DAILY. 10/23/16  Yes Kathrine Haddock, NP  multivitamin (ONE-A-DAY MEN'S) TABS tablet Take 1 tablet by mouth daily.   Yes [provider]      Allergies  Allergen Reactions  . Amoxicillin     Itching/crawling sensation      ROS:  Out of a complete 14 system review of symptoms, the patient complains only of the following symptoms, and all other reviewed systems are negative.  Weight gain Joint pain, muscle cramps, aching muscles Restless legs  Blood pressure 136/82, pulse 98, height 5\' 8"  (1.727 m), weight 269 lb 8 oz (122.2 kg).  Physical Exam  General: The patient is alert and cooperative at the time of the examination.  The patient is markedly obese.  Eyes: Pupils are equal, round, and reactive to light. Discs are flat bilaterally.  Neck: The  neck is supple, no carotid bruits are noted.  Respiratory: The respiratory examination is clear.  Cardiovascular: The cardiovascular examination reveals a regular rate and rhythm, no obvious murmurs or rubs are noted.  Skin: Extremities are without significant edema.  Neurologic Exam  Mental status: The patient is alert and oriented x 3 at the time of the examination. The patient has apparent normal recent and remote memory, with an apparently normal attention span and concentration ability.  Cranial nerves: Facial symmetry is present. There is good sensation of the face to pinprick and soft touch bilaterally. The strength of the facial muscles and the muscles to head turning and shoulder shrug are normal bilaterally. Speech is well enunciated, no aphasia or dysarthria is noted. Extraocular movements are full. Visual fields are full. The tongue is midline, and the patient has symmetric elevation of the soft palate. No obvious hearing deficits are noted.  Motor: The motor testing reveals 5 over 5 strength of all 4 extremities. Good symmetric motor tone is noted throughout.  Sensory: Sensory testing is intact to pinprick, soft touch, vibration sensation, and position sense on all 4 extremities. No evidence of extinction is noted.  Coordination: Cerebellar testing reveals good finger-nose-finger and heel-to-shin bilaterally.  Gait and station: Gait is normal. Tandem gait is normal. Romberg is negative. No drift is seen.  Reflexes: Deep tendon reflexes are symmetric and normal bilaterally. Toes are downgoing bilaterally.   Assessment/Plan:  1.  Nocturnal leg cramps  2.  Restless leg syndrome  The patient has an unusual history in that the muscle cramps will spread down the leg and cross over to the other side during a severe event.  The patient will be sent for some further blood work today.  He will have nerve conduction studies on both legs and EMG on the left leg.  The clinical  examination today is normal.  I have recommended the patient stretch out before he goes to bed at night.  Jill Alexanders MD 12/04/2017 8:51 AM  [facility] [address] [place], [place] [address]  Phone [phone] Fax [phone]

## 2017-12-04 NOTE — Patient Instructions (Signed)
   We will get EMG and NCV study to look at the muscle function of the legs.

## 2017-12-05 LAB — CK: CK TOTAL: 494 U/L — AB (ref 24–204)

## 2017-12-05 LAB — SEDIMENTATION RATE: Sed Rate: 7 mm/hr (ref 0–15)

## 2017-12-05 LAB — GLUTAMIC ACID DECARBOXYLASE AUTO ABS: Glutamic Acid Decarb Ab: <5 U/mL (ref 0.0–5.0)

## 2017-12-16 ENCOUNTER — Encounter: Payer: 59 | Admitting: Neurology

## 2017-12-23 ENCOUNTER — Encounter: Payer: 59 | Admitting: Neurology

## 2017-12-27 ENCOUNTER — Ambulatory Visit: Payer: 59 | Admitting: Internal Medicine

## 2017-12-30 ENCOUNTER — Encounter: Payer: Self-pay | Admitting: Neurology

## 2017-12-30 ENCOUNTER — Ambulatory Visit (INDEPENDENT_AMBULATORY_CARE_PROVIDER_SITE_OTHER): Payer: 59 | Admitting: Neurology

## 2017-12-30 DIAGNOSIS — G4762 Sleep related leg cramps: Secondary | ICD-10-CM

## 2017-12-30 DIAGNOSIS — M5416 Radiculopathy, lumbar region: Secondary | ICD-10-CM

## 2017-12-30 NOTE — Procedures (Signed)
     HISTORY:  Caleb [name] is a 37 year old gentleman with a history of leg cramps that usually occur at night but not always.  The patient has cramps in the left leg more so than the right affecting the calf muscles and thigh muscles.  He reports minimal low back pain.  He is is being evaluated for a possible neuropathy or a radiculopathy.  He has persistent elevations in CK enzyme levels.  NERVE CONDUCTION STUDIES:  Nerve conduction studies were performed on both lower extremities.  The distal motor latencies for the peroneal and posterior tibial nerves were within normal limits bilaterally with a low motor amplitude seen on the left peroneal nerve, normal on the right peroneal nerve and for the posterior tibial nerves bilaterally.  Nerve conduction velocities for the peroneal and posterior tibial nerves were normal bilaterally.  The sural and peroneal sensory latencies were within normal limits bilaterally.  The H reflex latencies were within normal limits bilaterally.  EMG STUDIES:  EMG study was performed on the left lower extremity:  The tibialis anterior muscle reveals 2 to 4K motor units, with occasional 6K units, with full recruitment. No fibrillations or positive waves were seen. The peroneus tertius muscle reveals 2 to 4K motor units with full recruitment. No fibrillations or positive waves were seen. The medial gastrocnemius muscle reveals 1 to 3K motor units with full recruitment. No fibrillations or positive waves were seen. The vastus lateralis muscle reveals 2 to 4K motor units with full recruitment. No fibrillations or positive waves were seen. The iliopsoas muscle reveals 2 to 4K motor units with full recruitment. No fibrillations or positive waves were seen. The biceps femoris muscle (long head) reveals 2 to 4K motor units, with occasional 5-6K units, with full recruitment. No fibrillations or positive waves were seen. The lumbosacral paraspinal muscles were tested at 3  levels, and revealed no abnormalities of insertional activity at all 3 levels tested. There was good relaxation.   IMPRESSION:  Nerve conduction studies done on both lower extremities shows evidence of lowering of motor amplitudes for the left peroneal nerve.  No evidence of a peripheral neuropathy is seen.  EMG evaluation of the left lower extremity shows isolated mild signs of neuropathic denervation in the tibialis anterior muscle and biceps femoris muscle.  The possibility of a low-grade L5 radiculopathy is suggested.  Jill Alexanders MD 12/30/2017 9:31 AM  [facility] [address] [place], [place] [address]  Phone [phone] Fax [phone]

## 2017-12-30 NOTE — Progress Notes (Signed)
Please refer to EMG and nerve conduction study procedure note. 

## 2017-12-30 NOTE — Progress Notes (Addendum)
The patient came in today for EMG nerve conduction study evaluation.  Nerve conduction studies show a lowering of motor amplitudes for the left peroneal nerve, there is isolated mild signs of neuropathic denervation in the tibialis anterior muscle and biceps femoris muscle, the patient will undergo MRI of the lumbar spine to fully exclude an L5 radiculopathy.  No evidence of a myopathic disorder is seen.    [place]    Nerve / Sites Muscle Latency Ref. Amplitude Ref. Rel Amp Segments Distance Velocity Ref. Area    ms ms mV mV %  cm m/s m/s mVms  L Peroneal - EDB     Ankle EDB 5.8 ?6.5 0.9 ?2.0 100 Ankle - EDB 9   3.3     Fib head EDB 12.7  0.9  95.2 Fib head - Ankle 31 45 ?44 3.0     Pop fossa EDB 14.9  0.8  91.2 Pop fossa - Fib head 10 45 ?44 3.7         Pop fossa - Ankle      R Peroneal - EDB     Ankle EDB 5.6 ?6.5 6.1 ?2.0 100 Ankle - EDB 9   21.8     Fib head EDB 12.0  5.7  94 Fib head - Ankle 31 48 ?44 21.4     Pop fossa EDB 14.1  5.5  95.5 Pop fossa - Fib head 10 48 ?44 20.8         Pop fossa - Ankle      L Tibial - AH     Ankle AH 5.2 ?5.8 12.6 ?4.0 100 Ankle - AH 9   37.7     Pop fossa AH 12.6  10.3  81.5 Pop fossa - Ankle 33 45 ?41 33.7  R Tibial - AH     Ankle AH 5.2 ?5.8 8.8 ?4.0 100 Ankle - AH 9   28.3     Pop fossa AH 12.8  8.8  100 Pop fossa - Ankle 33 43 ?41 30.4             SNC    Nerve / Sites Rec. Site Peak Lat Ref.  Amp Ref. Segments Distance    ms ms V V  cm  L Sural - Ankle (Calf)     Calf Ankle 4.1 ?4.4 6 ?6 Calf - Ankle 14  R Sural - Ankle (Calf)     Calf Ankle 4.3 ?4.4 6 ?6 Calf - Ankle 14  L Superficial peroneal - Ankle     Lat leg Ankle 4.1 ?4.4 6 ?6 Lat leg - Ankle 14  R Superficial peroneal - Ankle     Lat leg Ankle 4.0 ?4.4 6 ?6 Lat leg - Ankle 14              F  Wave    Nerve F Lat Ref.   ms ms  L Tibial - AH 47.7 ?56.0  R Tibial - AH 47.3 ?56.0         EMG full

## 2017-12-31 ENCOUNTER — Telehealth: Payer: Self-pay | Admitting: Neurology

## 2017-12-31 NOTE — Telephone Encounter (Signed)
[place] [phone] (02-13-18) called and spoke with the patient, regarding the MRI, he wants to hold off on scheduling due to cost. DW

## 2018-01-07 ENCOUNTER — Encounter: Payer: Self-pay | Admitting: Family Medicine

## 2018-01-07 ENCOUNTER — Ambulatory Visit (INDEPENDENT_AMBULATORY_CARE_PROVIDER_SITE_OTHER): Payer: 59 | Admitting: Family Medicine

## 2018-01-07 VITALS — BP 122/76 | HR 83 | Temp 97.8°F | Wt 268.0 lb

## 2018-01-07 DIAGNOSIS — J209 Acute bronchitis, unspecified: Secondary | ICD-10-CM | POA: Insufficient documentation

## 2018-01-07 DIAGNOSIS — J029 Acute pharyngitis, unspecified: Secondary | ICD-10-CM | POA: Diagnosis not present

## 2018-01-07 LAB — POCT RAPID STREP A (OFFICE): Rapid Strep A Screen: NEGATIVE

## 2018-01-07 MED ORDER — GUAIFENESIN-CODEINE 100-10 MG/5ML PO SYRP: 5.0000 mL | ORAL_SOLUTION | Freq: Two times a day (BID) | ORAL | 0 refills | Status: DC | PRN

## 2018-01-07 NOTE — Patient Instructions (Signed)
Rapid strep test was negative today You have an acute respiratory infection possibly bronchitis, but likely viral. Antibiotics are not needed for this.  Viral infections usually take 7-10 days to resolve.  The cough can last a few weeks to go away. Use medication as prescribed: cheratussin cough syrup for night time.  Push fluids and plenty of rest. May take ibuprofen 600mg  with meals for next 3-5 days for sinus and lung inflammation. May take plain mucinex with plenty of water to help mobilize mucous out.  Let us know if fever >101 , worsening productive cough, or just not improving with treatment.  Call clinic with questions.  Good to see you today. I hope you start feeling better soon.

## 2018-01-07 NOTE — Progress Notes (Signed)
BP 122/76 (BP Location: Left Arm, Patient Position: Sitting, Cuff Size: Large)   Pulse 83   Temp 97.8 F (36.6 C) (Oral)   Wt 268 lb (121.6 kg)   SpO2 96%   BMI 40.75 kg/m    CC: cough Subjective:    Patient ID: Caleb [name], male    DOB: 1981/03/03, 37 y.o.   MRN: [id number]  HPI: Caleb [name] is a 37 y.o. male presenting on 01/07/2018 for Cough (Started with a sore throat. Now has a productive cough that started 4 days ago. Cough is causing pain in mid chest. Says this is 3rd time being sick this yr. Has also, had the flu. )   4-5d h/o persistent ST progressed to productive cough, chest discomfort with cough. Some PNDrainage. Head congestion, sinus congestion, and dyspnea. Symptoms worse at night time. Trouble sleeping due to cough and drainage. Chest > head congestion.   No fevers/chills, ear or tooth pain, headaches, or wheezing.   Has tried OTC nyquil, dayquil, and throat lozenges without improvement. Also tried mucinex.  No sick contacts at home. Non smoker.  No h/o asthma.   Had f/u 10/22/2017. Then had another URI late 10/2017 - treated with zpack. Symptoms did fully resolve.    Relevant past medical, surgical, family and social history reviewed and updated as indicated. Interim medical history since our last visit reviewed. Allergies and medications reviewed and updated. Outpatient Medications Prior to Visit  Medication Sig Dispense Refill  . ibuprofen (ADVIL,MOTRIN) 200 MG tablet Take 200 mg by mouth.    Marland Kitchen lisinopril (PRINIVIL,ZESTRIL) 10 MG tablet TAKE 1 TABLET (10 MG TOTAL) BY MOUTH DAILY. 90 tablet 1  . multivitamin (ONE-A-DAY MEN'S) TABS tablet Take 1 tablet by mouth daily.    Marland Kitchen azithromycin (ZITHROMAX) 250 MG tablet 2 pills day 1, 1 pill day 2-5 6 tablet 0   No facility-administered medications prior to visit.      Per HPI unless specifically indicated in ROS section below Review of Systems     Objective:    BP 122/76 (BP Location: Left Arm, Patient  Position: Sitting, Cuff Size: Large)   Pulse 83   Temp 97.8 F (36.6 C) (Oral)   Wt 268 lb (121.6 kg)   SpO2 96%   BMI 40.75 kg/m   Wt Readings from Last 3 Encounters:  01/07/18 268 lb (121.6 kg)  12/04/17 269 lb 8 oz (122.2 kg)  11/29/17 271 lb 3.2 oz (123 kg)    Physical Exam  Constitutional: He appears well-developed and well-nourished. No distress.  HENT:  Head: Normocephalic and atraumatic.  Right Ear: Hearing, external ear and ear canal normal.  Left Ear: Hearing, external ear and ear canal normal.  Nose: Mucosal edema (nasal mucosal congestion, erythema R>L) present. No rhinorrhea. Right sinus exhibits no maxillary sinus tenderness and no frontal sinus tenderness. Left sinus exhibits no maxillary sinus tenderness and no frontal sinus tenderness.  Mouth/Throat: Uvula is midline and mucous membranes are normal. Posterior oropharyngeal erythema (mild) present. No oropharyngeal exudate, posterior oropharyngeal edema or tonsillar abscesses.  Cerumen covering L TM Congested R TM Inflammation of L tonsil without exudate  Eyes: Conjunctivae and EOM are normal. Pupils are equal, round, and reactive to light. No scleral icterus.  Neck: Normal range of motion. Neck supple.  Cardiovascular: Normal rate, regular rhythm, normal heart sounds and intact distal pulses.  No murmur heard. Pulmonary/Chest: Effort normal and breath sounds normal. No respiratory distress. He has no wheezes. He has no rales.  Lungs clear, cough present  Lymphadenopathy:    He has no cervical adenopathy.  Skin: Skin is warm and dry. No rash noted.  Nursing note and vitals reviewed.  Results for orders placed or performed in visit on 01/07/18  POCT rapid strep A  Result Value Ref Range   Rapid Strep A Screen Negative Negative       Assessment & Plan:   Problem List Items Addressed This Visit    Acute bronchitis - Primary    Anticipate acute respiratory infection possible developing bronchitis, viral given  short duration. Supportive care reviewed - rec fluids, rest, NSAID, Rx codeine cough syrup for night time with sedation precautions, plain mucinex.  Red flags to update [place] for further care reviewed with patient.  Out of work x 2 days (today, tomorrow). RST for ST with tonsillar inflammation noted today - negative       Other Visit Diagnoses    Sore throat       Relevant Orders   POCT rapid strep A (Completed)       Meds ordered this encounter  Medications  . guaiFENesin-codeine (CHERATUSSIN AC) 100-10 MG/5ML syrup    Sig: Take 5 mLs by mouth 2 (two) times daily as needed for cough (sedation precautions).    Dispense:  120 mL    Refill:  0   Orders Placed This Encounter  Procedures  . POCT rapid strep A    Follow up plan: Return if symptoms worsen or fail to improve.  Ria Bush, MD

## 2018-01-07 NOTE — Assessment & Plan Note (Addendum)
Anticipate acute respiratory infection possible developing bronchitis, viral given short duration. Supportive care reviewed - rec fluids, rest, NSAID, Rx codeine cough syrup for night time with sedation precautions, plain mucinex.  Red flags to update [place] for further care reviewed with patient.  Out of work x 2 days (today, tomorrow). RST for ST with tonsillar inflammation noted today - negative

## 2018-01-21 ENCOUNTER — Encounter: Payer: Self-pay | Admitting: Family Medicine

## 2018-01-26 MED ORDER — AZITHROMYCIN 250 MG PO TABS: ORAL_TABLET | ORAL | 0 refills | Status: DC

## 2018-01-29 ENCOUNTER — Encounter: Payer: Self-pay | Admitting: Internal Medicine

## 2018-01-29 ENCOUNTER — Ambulatory Visit (INDEPENDENT_AMBULATORY_CARE_PROVIDER_SITE_OTHER): Payer: 59 | Admitting: Internal Medicine

## 2018-01-29 VITALS — BP 108/76 | HR 77 | Temp 98.5°F | Ht 68.0 in | Wt 264.6 lb

## 2018-01-29 DIAGNOSIS — R0982 Postnasal drip: Secondary | ICD-10-CM

## 2018-01-29 DIAGNOSIS — I10 Essential (primary) hypertension: Secondary | ICD-10-CM | POA: Diagnosis not present

## 2018-01-29 DIAGNOSIS — G4762 Sleep related leg cramps: Secondary | ICD-10-CM

## 2018-01-29 DIAGNOSIS — J40 Bronchitis, not specified as acute or chronic: Secondary | ICD-10-CM

## 2018-01-29 DIAGNOSIS — K760 Fatty (change of) liver, not elsewhere classified: Secondary | ICD-10-CM

## 2018-01-29 MED ORDER — IPRATROPIUM BROMIDE 0.06 % NA SOLN: 2.0000 | Freq: Three times a day (TID) | NASAL | 12 refills | Status: DC

## 2018-01-29 MED ORDER — ALBUTEROL SULFATE HFA 108 (90 BASE) MCG/ACT IN AERS: 1.0000 | INHALATION_SPRAY | RESPIRATORY_TRACT | 2 refills | Status: DC | PRN

## 2018-01-29 MED ORDER — TELMISARTAN 20 MG PO TABS: 20.0000 mg | ORAL_TABLET | Freq: Every day | ORAL | 3 refills | Status: DC

## 2018-01-29 NOTE — Progress Notes (Signed)
Pre visit review using our clinic review tool, if applicable. No additional management support is needed unless otherwise documented below in the visit note. 

## 2018-01-29 NOTE — Patient Instructions (Signed)
Try Robitussin DM or Mucinex DM green label  My chart me in 1 week to let me know how you are doing   Cough, Adult Coughing is a reflex that clears your throat and your airways. Coughing helps to heal and protect your lungs. It is normal to cough occasionally, but a cough that happens with other symptoms or lasts a long time may be a sign of a condition that needs treatment. A cough may last only 2-3 weeks (acute), or it may last longer than 8 weeks (chronic). What are the causes? Coughing is commonly caused by:  Breathing in substances that irritate your lungs.  A viral or bacterial respiratory infection.  Allergies.  Asthma.  Postnasal drip.  Smoking.  Acid backing up from the stomach into the esophagus (gastroesophageal reflux).  Certain medicines.  Chronic lung problems, including COPD (or rarely, lung cancer).  Other medical conditions such as heart failure.  Follow these instructions at home: Pay attention to any changes in your symptoms. Take these actions to help with your discomfort:  Take medicines only as told by your health care provider. ? If you were prescribed an antibiotic medicine, take it as told by your health care provider. Do not stop taking the antibiotic even if you start to feel better. ? Talk with your health care provider before you take a cough suppressant medicine.  Drink enough fluid to keep your urine clear or pale yellow.  If the air is dry, use a cold steam vaporizer or humidifier in your bedroom or your home to help loosen secretions.  Avoid anything that causes you to cough at work or at home.  If your cough is worse at night, try sleeping in a semi-upright position.  Avoid cigarette smoke. If you smoke, quit smoking. If you need help quitting, ask your health care provider.  Avoid caffeine.  Avoid alcohol.  Rest as needed.  Contact a health care provider if:  You have new symptoms.  You cough up pus.  Your cough does not get  better after 2-3 weeks, or your cough gets worse.  You cannot control your cough with suppressant medicines and you are losing sleep.  You develop pain that is getting worse or pain that is not controlled with pain medicines.  You have a fever.  You have unexplained weight loss.  You have night sweats. Get help right away if:  You cough up blood.  You have difficulty breathing.  Your heartbeat is very fast. This information is not intended to replace advice given to you by your health care provider. Make sure you discuss any questions you have with your health care provider. Document Released: 04/06/2011 Document Revised: 03/15/2016 Document Reviewed: 12/15/2014 Elsevier Interactive Patient Education  [employer].

## 2018-02-01 ENCOUNTER — Encounter: Payer: Self-pay | Admitting: Internal Medicine

## 2018-02-01 NOTE — Progress Notes (Signed)
Chief Complaint  Patient presents with  . Follow-up  . Cough   F/u 1. C/o cough since last visit with me no h/o allergies/asthma, gerd sx's denies. He has been on lisinopril for some time but reports never had an issue with cough. He does repor cough is clear sputum and c/o post nasal drip. He tries Nyquil and Dayquil at home cough has been present since 10/2017 when he had flu A. At times he has wheezing and coughing so much his voice is hoarse.  CXR 2.8.19 negative  2. Still c/o muscle spasms and frustrated no cause he had EMG/NCS with neurology who wanted to order MRI low back but he states this cost was expensive and he is not having low back pain.  EMG 12/30/17 c/w low grade L5 radiculopathy   Review of Systems  Constitutional: Negative for weight loss.  HENT: Negative for hearing loss.        +postnasal drip  Eyes: Negative for blurred vision.  Respiratory: Positive for cough.   Cardiovascular: Negative for chest pain.  Gastrointestinal: Negative for heartburn.  Musculoskeletal:       +muscle cramps/spasms   Skin: Negative for rash.  Endo/Heme/Allergies: Negative for environmental allergies.  Psychiatric/Behavioral: Negative for depression.   Past Medical History:  Diagnosis Date  . Chicken pox   . Hyperlipidemia   . Hypertension   . Nocturnal leg cramps 12/04/2017  . Obesity   . Obstructive sleep apnea    Past Surgical History:  Procedure Laterality Date  . WISDOM TOOTH EXTRACTION     Family History  Problem Relation Age of Onset  . Hypertension Mother   . Diabetes Father   . Cancer Maternal Grandmother        colon, liver  . Hypertension Maternal Grandmother   . Cancer Maternal Grandfather        prostate  . Hypertension Maternal Grandfather   . Anemia Paternal Grandmother   . Cancer Paternal Grandfather        leukemia  . Hypertension Maternal Aunt    Social History   Socioeconomic History  . Marital status: Married    Spouse name: Not on file  . Number  of children: 1  . Years of education: 12+  . Highest education level: Not on file  Occupational History  . Occupation: [employer]  Social Needs  . Financial resource strain: Not on file  . Food insecurity:    Worry: Not on file    Inability: Not on file  . Transportation needs:    Medical: Not on file    Non-medical: Not on file  Tobacco Use  . Smoking status: Never Smoker  . Smokeless tobacco: Former Systems developer    Types: Chew  Substance and Sexual Activity  . Alcohol use: Yes    Alcohol/week: 1.2 oz    Types: 2 Cans of beer per week    Comment: on occasion  . Drug use: No  . Sexual activity: Yes  Lifestyle  . Physical activity:    Days per week: Not on file    Minutes per session: Not on file  . Stress: Not on file  Relationships  . Social connections:    Talks on phone: Not on file    Gets together: Not on file    Attends religious service: Not on file    Active member of club or organization: Not on file    Attends meetings of clubs or organizations: Not on file    Relationship status:  Not on file  . Intimate partner violence:    Fear of current or ex partner: Not on file    Emotionally abused: Not on file    Physically abused: Not on file    Forced sexual activity: Not on file  Other Topics Concern  . Not on file  Social History Narrative   Lives with wife   Caffeine use: 1 cup coffee or 1 sugar free redbull   Works as Conservation officer, historic buildings x 16 years    Married    Left-handed   Current Meds  Medication Sig  . ibuprofen (ADVIL,MOTRIN) 200 MG tablet Take 200 mg by mouth.  . multivitamin (ONE-A-DAY MEN'S) TABS tablet Take 1 tablet by mouth daily.  . [DISCONTINUED] lisinopril (PRINIVIL,ZESTRIL) 10 MG tablet TAKE 1 TABLET (10 MG TOTAL) BY MOUTH DAILY.   Allergies  Allergen Reactions  . Amoxicillin     Itching/crawling sensation     Recent Results (from the past 2160 hour(s))  Urinalysis, Routine w reflex microscopic     Status: None   Collection Time:  11/29/17 10:49 AM  Result Value Ref Range   Specific Gravity, UA 1.025 1.005 - 1.030   pH, UA 5.5 5.0 - 7.5   Color, UA Yellow Yellow   Appearance Ur Clear Clear   Leukocytes, UA Negative Negative   Protein, UA Negative Negative/Trace   Glucose, UA Negative Negative   Ketones, UA Negative Negative   RBC, UA Negative Negative   Bilirubin, UA Negative Negative   Urobilinogen, Ur 0.2 0.2 - 1.0 mg/dL   Nitrite, UA Negative Negative   Microscopic Examination Comment     Comment: Microscopic not indicated and not performed.  T4, free     Status: None   Collection Time: 11/29/17 10:49 AM  Result Value Ref Range   Free T4 1.16 0.82 - 1.77 ng/dL  Magnesium     Status: None   Collection Time: 11/29/17 10:49 AM  Result Value Ref Range   Magnesium 2.2 1.6 - 2.3 mg/dL  Iron, TIBC and Ferritin Panel     Status: None   Collection Time: 11/29/17 10:49 AM  Result Value Ref Range   Total Iron Binding Capacity 255 250 - 450 ug/dL   UIBC 169 111 - 343 ug/dL   Iron 86 38 - 169 ug/dL   Iron Saturation 34 15 - 55 %   Ferritin 278 30 - 400 ng/mL  Comp Met (CMET)     Status: None   Collection Time: 11/29/17 10:49 AM  Result Value Ref Range   Glucose 90 65 - 99 mg/dL   BUN 12 6 - 20 mg/dL   Creatinine, Ser 0.92 0.76 - 1.27 mg/dL   GFR calc non Af Amer 105 >59 mL/min/1.73   GFR calc Af Amer 122 >59 mL/min/1.73   BUN/Creatinine Ratio 13 9 - 20   Sodium 143 134 - 144 mmol/L   Potassium 4.3 3.5 - 5.2 mmol/L   Chloride 105 96 - 106 mmol/L   CO2 25 20 - 29 mmol/L   Calcium 9.3 8.7 - 10.2 mg/dL   Total Protein 6.6 6.0 - 8.5 g/dL   Albumin 4.0 3.5 - 5.5 g/dL   Globulin, Total 2.6 1.5 - 4.5 g/dL   Albumin/Globulin Ratio 1.5 1.2 - 2.2   Bilirubin Total 0.3 0.0 - 1.2 mg/dL   Alkaline Phosphatase 93 39 - 117 IU/L   AST 18 0 - 40 IU/L   ALT 28 0 - 44 IU/L  CK (  Creatine Kinase)     Status: Abnormal   Collection Time: 11/29/17 10:49 AM  Result Value Ref Range   Total CK 324 (H) 24 - 204 U/L  CBC  w/Diff     Status: None   Collection Time: 11/29/17 10:49 AM  Result Value Ref Range   WBC 9.4 3.4 - 10.8 x10E3/uL   RBC 4.81 4.14 - 5.80 x10E6/uL   Hemoglobin 14.9 13.0 - 17.7 g/dL   Hematocrit 43.7 37.5 - 51.0 %   MCV 91 79 - 97 fL   MCH 31.0 26.6 - 33.0 pg   MCHC 34.1 31.5 - 35.7 g/dL   RDW 12.7 12.3 - 15.4 %   Platelets 316 150 - 379 x10E3/uL   Neutrophils 65 Not Estab. %   Lymphs 25 Not Estab. %   Monocytes 7 Not Estab. %   Eos 2 Not Estab. %   Basos 0 Not Estab. %   Neutrophils Absolute 6.1 1.4 - 7.0 x10E3/uL   Lymphocytes Absolute 2.3 0.7 - 3.1 x10E3/uL   Monocytes Absolute 0.7 0.1 - 0.9 x10E3/uL   EOS (ABSOLUTE) 0.2 0.0 - 0.4 x10E3/uL   Basophils Absolute 0.0 0.0 - 0.2 x10E3/uL   Immature Granulocytes 1 Not Estab. %   Immature Grans (Abs) 0.1 0.0 - 0.1 x10E3/uL  TSH     Status: None   Collection Time: 11/29/17 10:49 AM  Result Value Ref Range   TSH 1.080 0.450 - 4.500 uIU/mL  Sedimentation rate     Status: None   Collection Time: 12/04/17  8:52 AM  Result Value Ref Range   Sed Rate 7 0 - 15 mm/hr  Glutamic acid decarboxylase auto abs     Status: None   Collection Time: 12/04/17  8:52 AM  Result Value Ref Range   Glutamic Acid Decarb Ab <5.0 0.0 - 5.0 U/mL  CK     Status: Abnormal   Collection Time: 12/04/17  8:52 AM  Result Value Ref Range   Total CK 494 (H) 24 - 204 U/L  POCT rapid strep A     Status: None   Collection Time: 01/07/18  4:21 PM  Result Value Ref Range   Rapid Strep A Screen Negative Negative   Objective  Body mass index is 40.23 kg/m. Wt Readings from Last 3 Encounters:  01/29/18 264 lb 9.6 oz (120 kg)  01/07/18 268 lb (121.6 kg)  12/04/17 269 lb 8 oz (122.2 kg)   Temp Readings from Last 3 Encounters:  01/29/18 98.5 F (36.9 C) (Oral)  01/07/18 97.8 F (36.6 C) (Oral)  11/29/17 98.5 F (36.9 C) (Oral)   BP Readings from Last 3 Encounters:  01/29/18 108/76  01/07/18 122/76  12/04/17 136/82   Pulse Readings from Last 3  Encounters:  01/29/18 77  01/07/18 83  12/04/17 98    Physical Exam  Constitutional: He is oriented to person, place, and time. Vital signs are normal. He appears well-developed.  HENT:  Head: Normocephalic and atraumatic.  Mouth/Throat: Oropharynx is clear and moist and mucous membranes are normal.  Eyes: Pupils are equal, round, and reactive to light. Conjunctivae are normal.  Cardiovascular: Normal rate, regular rhythm and normal heart sounds.  Pulmonary/Chest: Effort normal. He has wheezes.  Mild wheezing b/l lungs   Neurological: He is alert and oriented to person, place, and time. Gait normal.  Skin: Skin is warm, dry and intact.  Psychiatric: He has a normal mood and affect. His speech is normal and behavior is normal. Judgment and  thought content normal. Cognition and memory are normal.  Nursing note and vitals reviewed.   Assessment   1. Cough Ddx etiology postviral cough/bronchitis,ACEI, post nasal drip denies sx's allergies/asthma/GERD 2. Muscle cramps/spasms still present though reduced freq. with CK total elevated 11/29/17 324 and 12/04/17 494. EMG +L5 radiculopathy 3. HM Plan  1.  Stop Lisinopril 10 change to telmisartan 20 mg qd for HTN Trial of Atrovent and albuterol prn  My chart me in 1 week on how doing   2.  See HPI  Pt wants to hold on Xray low back and did not want to do MRI as rec neurology  Consider repeat CK in future, iron levels and magnesium 11/29/17 normal  Consider check B12 in future   3.  Had flu shot  Tdap will do in future  Consider check hep B status in future h/o fatty liver will need hep A/B vaccines in future  Will need lipid check in future   Provider: Dr. Olivia Mackie McLean-Scocuzza-Internal Medicine

## 2018-02-10 ENCOUNTER — Encounter: Payer: Self-pay | Admitting: Internal Medicine

## 2018-03-24 ENCOUNTER — Telehealth: Payer: Self-pay | Admitting: Unknown Physician Specialty

## 2018-03-24 NOTE — Telephone Encounter (Signed)
CPAP report on patient showing 97% compliance with therapy.  Tolerating it well without residual effects.  No daytime sleepiness.

## 2018-04-30 ENCOUNTER — Ambulatory Visit (INDEPENDENT_AMBULATORY_CARE_PROVIDER_SITE_OTHER): Payer: 59 | Admitting: Internal Medicine

## 2018-04-30 ENCOUNTER — Telehealth: Payer: Self-pay | Admitting: Internal Medicine

## 2018-04-30 ENCOUNTER — Encounter: Payer: Self-pay | Admitting: Internal Medicine

## 2018-04-30 VITALS — BP 130/86 | HR 77 | Temp 98.0°F | Ht 68.0 in | Wt 275.0 lb

## 2018-04-30 DIAGNOSIS — I10 Essential (primary) hypertension: Secondary | ICD-10-CM | POA: Diagnosis not present

## 2018-04-30 DIAGNOSIS — J329 Chronic sinusitis, unspecified: Secondary | ICD-10-CM | POA: Diagnosis not present

## 2018-04-30 DIAGNOSIS — E559 Vitamin D deficiency, unspecified: Secondary | ICD-10-CM

## 2018-04-30 DIAGNOSIS — Z0184 Encounter for antibody response examination: Secondary | ICD-10-CM

## 2018-04-30 DIAGNOSIS — Z1159 Encounter for screening for other viral diseases: Secondary | ICD-10-CM

## 2018-04-30 DIAGNOSIS — Z23 Encounter for immunization: Secondary | ICD-10-CM

## 2018-04-30 DIAGNOSIS — Z13220 Encounter for screening for lipoid disorders: Secondary | ICD-10-CM | POA: Diagnosis not present

## 2018-04-30 DIAGNOSIS — K760 Fatty (change of) liver, not elsewhere classified: Secondary | ICD-10-CM

## 2018-04-30 MED ORDER — AMLODIPINE BESYLATE 5 MG PO TABS: 5.0000 mg | ORAL_TABLET | Freq: Every day | ORAL | 3 refills | Status: DC

## 2018-04-30 MED ORDER — AZITHROMYCIN 250 MG PO TABS: ORAL_TABLET | ORAL | 0 refills | Status: DC

## 2018-04-30 NOTE — Progress Notes (Addendum)
Chief Complaint  Patient presents with  . Follow-up   F/u  1. HTN controlled on micardis 20 mg but wants to change hard to get with pharmacy and cost a lot  2. Sinusitis 3rd sinus infection since 10/2017 tried Mucinex DM, pedialyte. He is having sinus pressure and pain, no fever, he is having reduced taste and sinus drainage and pressure at his cheeks.  3. osa last sleep study 4-5 years ago at [facility] need report to order new supplies.   Review of Systems  Constitutional: Negative for fever.  HENT: Positive for congestion and sinus pain.        +loss of taste   Respiratory: Positive for cough.   Cardiovascular: Negative for chest pain.  Musculoskeletal: Negative for back pain.       +leg cramps improved    Past Medical History:  Diagnosis Date  . Chicken pox   . Hyperlipidemia   . Hypertension   . Nocturnal leg cramps 12/04/2017  . Obesity   . Obstructive sleep apnea    Past Surgical History:  Procedure Laterality Date  . WISDOM TOOTH EXTRACTION     Family History  Problem Relation Age of Onset  . Hypertension Mother   . Diabetes Father   . Cancer Maternal Grandmother        colon, liver  . Hypertension Maternal Grandmother   . Cancer Maternal Grandfather        prostate  . Hypertension Maternal Grandfather   . Anemia Paternal Grandmother   . Cancer Paternal Grandfather        leukemia  . Hypertension Maternal Aunt    Social History   Socioeconomic History  . Marital status: Married    Spouse name: Not on file  . Number of children: 1  . Years of education: 12+  . Highest education level: Not on file  Occupational History  . Occupation: [employer]  Social Needs  . Financial resource strain: Not on file  . Food insecurity:    Worry: Not on file    Inability: Not on file  . Transportation needs:    Medical: Not on file    Non-medical: Not on file  Tobacco Use  . Smoking status: Never Smoker  . Smokeless tobacco: Former Systems developer    Types: Chew   Substance and Sexual Activity  . Alcohol use: Yes    Alcohol/week: 1.2 oz    Types: 2 Cans of beer per week    Comment: on occasion  . Drug use: No  . Sexual activity: Yes  Lifestyle  . Physical activity:    Days per week: Not on file    Minutes per session: Not on file  . Stress: Not on file  Relationships  . Social connections:    Talks on phone: Not on file    Gets together: Not on file    Attends religious service: Not on file    Active member of club or organization: Not on file    Attends meetings of clubs or organizations: Not on file    Relationship status: Not on file  . Intimate partner violence:    Fear of current or ex partner: Not on file    Emotionally abused: Not on file    Physically abused: Not on file    Forced sexual activity: Not on file  Other Topics Concern  . Not on file  Social History Narrative   Lives with wife   Caffeine use: 1 cup coffee or  1 sugar free redbull   Works as Conservation officer, historic buildings x 16 years    Married    Left-handed   Current Meds  Medication Sig  . albuterol (PROVENTIL HFA;VENTOLIN HFA) 108 (90 Base) MCG/ACT inhaler Inhale 1-2 puffs into the lungs every 4 (four) hours as needed for wheezing or shortness of breath.  Marland Kitchen ibuprofen (ADVIL,MOTRIN) 200 MG tablet Take 200 mg by mouth.  Marland Kitchen ipratropium (ATROVENT) 0.06 % nasal spray Place 2 sprays into both nostrils 3 (three) times daily.  . multivitamin (ONE-A-DAY MEN'S) TABS tablet Take 1 tablet by mouth daily.  . [DISCONTINUED] telmisartan (MICARDIS) 20 MG tablet Take 1 tablet (20 mg total) by mouth daily.   Allergies  Allergen Reactions  . Amoxicillin     Itching/crawling sensation     No results found for this or any previous visit (from the past 2160 hour(s)). Objective  Body mass index is 41.81 kg/m. Wt Readings from Last 3 Encounters:  04/30/18 275 lb (124.7 kg)  01/29/18 264 lb 9.6 oz (120 kg)  01/07/18 268 lb (121.6 kg)   Temp Readings from Last 3 Encounters:   04/30/18 98 F (36.7 C) (Oral)  01/29/18 98.5 F (36.9 C) (Oral)  01/07/18 97.8 F (36.6 C) (Oral)   BP Readings from Last 3 Encounters:  04/30/18 130/86  01/29/18 108/76  01/07/18 122/76   Pulse Readings from Last 3 Encounters:  04/30/18 77  01/29/18 77  01/07/18 83    Physical Exam  Constitutional: He is oriented to person, place, and time. Vital signs are normal. He appears well-developed and well-nourished. He is cooperative.  HENT:  Head: Normocephalic and atraumatic.  Mouth/Throat: Oropharynx is clear and moist and mucous membranes are normal.  Eyes: Pupils are equal, round, and reactive to light. Conjunctivae are normal.  Cardiovascular: Normal rate, regular rhythm and normal heart sounds.  Pulmonary/Chest: Effort normal and breath sounds normal.  Neurological: He is alert and oriented to person, place, and time. Gait normal.  Skin: Skin is warm, dry and intact.  Psychiatric: He has a normal mood and affect. His speech is normal and behavior is normal. Judgment and thought content normal. Cognition and memory are normal.  Nursing note and vitals reviewed.   Assessment   1. HTN  2. Sinusitis, recurrent 3. osa 4. HM Plan  1. D/c micardis 20 change to norvasc 5 if hypotension reduce to 2.5  2. zpack supportive care  CT sinus if + refer ENT  3. Get report [facility]  Obtained had sleep study 05/02/12  +OSA rec nasal cpap 7 cm h20 swift fx nasal pillows, mask size medium w/o chin strap and w/ humidification  4.  Had flu shot  Tdap today  Consider check hep A/B status today h/o fatty liver will need hep A/B vaccines in future if not immune Check lipid today  Consider check vit D     Provider: Dr. Olivia Mackie McLean-Scocuzza-Internal Medicine

## 2018-04-30 NOTE — Progress Notes (Signed)
Pre visit review using our clinic review tool, if applicable. No additional management support is needed unless otherwise documented below in the visit note. 

## 2018-04-30 NOTE — Patient Instructions (Addendum)
Take care f/u in 4 months sooner if needed  If your MMR titer is low please sch appt at health dept to get get re-vaccinated  Let me know if not feeling better  If your blood pressure is dropping <130/<80 cut norvasc in 1/2 pill      DTaP Vaccine (Diphtheria, Tetanus, and Pertussis): What You Need to Know 1. Why get vaccinated? Diphtheria, tetanus, and pertussis are serious diseases caused by bacteria. Diphtheria and pertussis are spread from person to person. Tetanus enters the body through cuts or wounds. DIPHTHERIA causes a thick covering in the back of the throat.  It can lead to breathing problems, paralysis, heart failure, and even death.  TETANUS (Lockjaw) causes painful tightening of the muscles, usually all over the body.  It can lead to "locking" of the jaw so the victim cannot open his mouth or swallow. Tetanus leads to death in up to 2 out of 10 cases.  PERTUSSIS (Whooping Cough) causes coughing spells so bad that it is hard for infants to eat, drink, or breathe. These spells can last for weeks.  It can lead to pneumonia, seizures (jerking and staring spells), brain damage, and death.  Diphtheria, tetanus, and pertussis vaccine (DTaP) can help prevent these diseases. Most children who are vaccinated with DTaP will be protected throughout childhood. Many more children would get these diseases if we stopped vaccinating. DTaP is a safer version of an older vaccine called DTP. DTP is no longer used in the [place]. 2. Who should get DTaP vaccine and when? Children should get 5 doses of DTaP vaccine, one dose at each of the following ages:  2 months  4 months  6 months  15-18 months  4-6 years  DTaP may be given at the same time as other vaccines. 3. Some children should not get DTaP vaccine or should wait  Children with minor illnesses, such as a cold, may be vaccinated. But children who are moderately or severely ill should usually wait until they recover before  getting DTaP vaccine.  Any child who had a life-threatening allergic reaction after a dose of DTaP should not get another dose.  Any child who suffered a brain or nervous system disease within 7 days after a dose of DTaP should not get another dose.  Talk with your doctor if your child: ? had a seizure or collapsed after a dose of DTaP, ? cried non-stop for 3 hours or more after a dose of DTaP, ? had a fever over 105F after a dose of DTaP. Ask your doctor for more information. Some of these children should not get another dose of pertussis vaccine, but may get a vaccine without pertussis, called DT. 4. Older children and adults DTaP is not licensed for adolescents, adults, or children 59 years of age and older. But older people still need protection. A vaccine called Tdap is similar to DTaP. A single dose of Tdap is recommended for people 11 through 39 years of age. Another vaccine, called Td, protects against tetanus and diphtheria, but not pertussis. It is recommended every 10 years. There are separate Vaccine Information Statements for these vaccines. 5. What are the risks from DTaP vaccine? Getting diphtheria, tetanus, or pertussis disease is much riskier than getting DTaP vaccine. However, a vaccine, like any medicine, is capable of causing serious problems, such as severe allergic reactions. The risk of DTaP vaccine causing serious harm, or death, is extremely small. Mild problems (common)  Fever (up to about 1  child in 4)  Redness or swelling where the shot was given (up to about 1 child in 4)  Soreness or tenderness where the shot was given (up to about 1 child in 4) These problems occur more often after the 4th and 5th doses of the DTaP series than after earlier doses. Sometimes the 4th or 5th dose of DTaP vaccine is followed by swelling of the entire arm or leg in which the shot was given, lasting 1-7 days (up to about 1 child in 63). Other mild problems include:  Fussiness (up  to about 1 child in 3)  Tiredness or poor appetite (up to about 1 child in 10)  Vomiting (up to about 1 child in 74) These problems generally occur 1-3 days after the shot. Moderate problems (uncommon)  Seizure (jerking or staring) (about 1 child out of 14,000)  Non-stop crying, for 3 hours or more (up to about 1 child out of 1,000)  High fever, over 105F (about 1 child out of 16,000) Severe problems (very rare)  Serious allergic reaction (less than 1 out of a million doses)  Several other severe problems have been reported after DTaP vaccine. These include: ? Long-term seizures, coma, or lowered consciousness ? Permanent brain damage. These are so rare it is hard to tell if they are caused by the vaccine. Controlling fever is especially important for children who have had seizures, for any reason. It is also important if another family member has had seizures. You can reduce fever and pain by giving your child an aspirin-free pain reliever when the shot is given, and for the next 24 hours, following the package instructions. 6. What if there is a serious reaction? What should I look for? Look for anything that concerns you, such as signs of a severe allergic reaction, very high fever, or behavior changes. Signs of a severe allergic reaction can include hives, swelling of the face and throat, difficulty breathing, a fast heartbeat, dizziness, and weakness. These would start a few minutes to a few hours after the vaccination. What should I do?  If you think it is a severe allergic reaction or other emergency that can't wait, call 9-1-1 or get the person to the nearest hospital. Otherwise, call your doctor.  Afterward, the reaction should be reported to the Vaccine Adverse Event Reporting System (VAERS). Your doctor might file this report, or you can do it yourself through the VAERS web site at www.vaers.SamedayNews.es, or by calling 423-460-8059. ? VAERS is only for reporting reactions.  They do not give medical advice. 7. The National Vaccine Injury Compensation Program The [employer] Vaccine Injury Compensation Program (VICP) is a federal program that was created to compensate people who may have been injured by certain vaccines. Persons who believe they may have been injured by a vaccine can learn about the program and about filing a claim by calling 939-531-8793 or visiting the [place] website at GoldCloset.com.ee. 8. How can I learn more?  Ask your doctor.  Call your local or state health department.  Contact the Centers for Disease Control and Prevention (CDC): ? Call 906-183-3973 (1-800-CDC-INFO) or ? Visit CDC's website at http://hunter.com/ CDC DTaP Vaccine (Diphtheria, Tetanus, and Pertussis) VIS (03/07/06) This information is not intended to replace advice given to you by your health care provider. Make sure you discuss any questions you have with your health care provider. Document Released: 08/05/2006 Document Revised: 06/28/2016 Document Reviewed: 06/28/2016 Elsevier Interactive Patient Education  2017 [place].  Sinusitis, Adult Sinusitis  is soreness and inflammation of your sinuses. Sinuses are hollow spaces in the bones around your face. Your sinuses are located:  Around your eyes.  In the middle of your forehead.  Behind your nose.  In your cheekbones.  Your sinuses and nasal passages are lined with a stringy fluid (mucus). Mucus normally drains out of your sinuses. When your nasal tissues become inflamed or swollen, the mucus can become trapped or blocked so air cannot flow through your sinuses. This allows bacteria, viruses, and funguses to grow, which leads to infection. Sinusitis can develop quickly and last for 7?10 days (acute) or for more than 12 weeks (chronic). Sinusitis often develops after a cold. What are the causes? This condition is caused by anything that creates swelling in the sinuses or stops mucus from draining,  including:  Allergies.  Asthma.  Bacterial or viral infection.  Abnormally shaped bones between the nasal passages.  Nasal growths that contain mucus (nasal polyps).  Narrow sinus openings.  Pollutants, such as chemicals or irritants in the air.  A foreign object stuck in the nose.  A fungal infection. This is rare.  What increases the risk? The following factors may make you more likely to develop this condition:  Having allergies or asthma.  Having had a recent cold or respiratory tract infection.  Having structural deformities or blockages in your nose or sinuses.  Having a weak immune system.  Doing a lot of swimming or diving.  Overusing nasal sprays.  Smoking.  What are the signs or symptoms? The main symptoms of this condition are pain and a feeling of pressure around the affected sinuses. Other symptoms include:  Upper toothache.  Earache.  Headache.  Bad breath.  Decreased sense of smell and taste.  A cough that may get worse at night.  Fatigue.  Fever.  Thick drainage from your nose. The drainage is often green and it may contain pus (purulent).  Stuffy nose or congestion.  Postnasal drip. This is when extra mucus collects in the throat or back of the nose.  Swelling and warmth over the affected sinuses.  Sore throat.  Sensitivity to light.  How is this diagnosed? This condition is diagnosed based on symptoms, a medical history, and a physical exam. To find out if your condition is acute or chronic, your health care provider may:  Look in your nose for signs of nasal polyps.  Tap over the affected sinus to check for signs of infection.  View the inside of your sinuses using an imaging device that has a light attached (endoscope).  If your health care provider suspects that you have chronic sinusitis, you may also:  Be tested for allergies.  Have a sample of mucus taken from your nose (nasal culture) and checked for  bacteria.  Have a mucus sample examined to see if your sinusitis is related to an allergy.  If your sinusitis does not respond to treatment and it lasts longer than 8 weeks, you may have an MRI or CT scan to check your sinuses. These scans also help to determine how severe your infection is. In rare cases, a bone biopsy may be done to rule out more serious types of fungal sinus disease. How is this treated? Treatment for sinusitis depends on the cause and whether your condition is chronic or acute. If a virus is causing your sinusitis, your symptoms will go away on their own within 10 days. You may be given medicines to relieve your symptoms, including:  Topical nasal decongestants. They shrink swollen nasal passages and let mucus drain from your sinuses.  Antihistamines. These drugs block inflammation that is triggered by allergies. This can help to ease swelling in your nose and sinuses.  Topical nasal corticosteroids. These are nasal sprays that ease inflammation and swelling in your nose and sinuses.  Nasal saline washes. These rinses can help to get rid of thick mucus in your nose.  If your condition is caused by bacteria, you will be given an antibiotic medicine. If your condition is caused by a fungus, you will be given an antifungal medicine. Surgery may be needed to correct underlying conditions, such as narrow nasal passages. Surgery may also be needed to remove polyps. Follow these instructions at home: Medicines  Take, use, or apply over-the-counter and prescription medicines only as told by your health care provider. These may include nasal sprays.  If you were prescribed an antibiotic medicine, take it as told by your health care provider. Do not stop taking the antibiotic even if you start to feel better. Hydrate and Humidify  Drink enough water to keep your urine clear or pale yellow. Staying hydrated will help to thin your mucus.  Use a cool mist humidifier to keep the  humidity level in your home above 50%.  Inhale steam for 10-15 minutes, 3-4 times a day or as told by your health care provider. You can do this in the bathroom while a hot shower is running.  Limit your exposure to cool or dry air. Rest  Rest as much as possible.  Sleep with your head raised (elevated).  Make sure to get enough sleep each night. General instructions  Apply a warm, moist washcloth to your face 3-4 times a day or as told by your health care provider. This will help with discomfort.  Wash your hands often with soap and water to reduce your exposure to viruses and other germs. If soap and water are not available, use hand sanitizer.  Do not smoke. Avoid being around people who are smoking (secondhand smoke).  Keep all follow-up visits as told by your health care provider. This is important. Contact a health care provider if:  You have a fever.  Your symptoms get worse.  Your symptoms do not improve within 10 days. Get help right away if:  You have a severe headache.  You have persistent vomiting.  You have pain or swelling around your face or eyes.  You have vision problems.  You develop confusion.  Your neck is stiff.  You have trouble breathing. This information is not intended to replace advice given to you by your health care provider. Make sure you discuss any questions you have with your health care provider. Document Released: 10/08/2005 Document Revised: 06/03/2016 Document Reviewed: 08/03/2015 Elsevier Interactive Patient Education  [employer].

## 2018-04-30 NOTE — Telephone Encounter (Signed)
ROI received from [facility] on 04/30/2018.

## 2018-05-01 ENCOUNTER — Encounter: Payer: Self-pay | Admitting: Internal Medicine

## 2018-05-01 ENCOUNTER — Other Ambulatory Visit: Payer: Self-pay | Admitting: Internal Medicine

## 2018-05-01 DIAGNOSIS — E559 Vitamin D deficiency, unspecified: Secondary | ICD-10-CM

## 2018-05-01 MED ORDER — CHOLECALCIFEROL 1.25 MG (50000 UT) PO CAPS: 50000.0000 [IU] | ORAL_CAPSULE | ORAL | 1 refills | Status: DC

## 2018-05-02 LAB — VITAMIN D 25 HYDROXY (VIT D DEFICIENCY, FRACTURES): Vit D, 25-Hydroxy: 16 ng/mL — ABNORMAL LOW (ref 30.0–100.0)

## 2018-05-02 LAB — LIPID PANEL
CHOL/HDL RATIO: 5.4 ratio — AB (ref 0.0–5.0)
Cholesterol, Total: 194 mg/dL (ref 100–199)
HDL: 36 mg/dL — AB (ref >39)
LDL CALC: 135 mg/dL — AB (ref 0–99)
Triglycerides: 113 mg/dL (ref 0–149)
VLDL Cholesterol Cal: 23 mg/dL (ref 5–40)

## 2018-05-02 LAB — MEASLES/MUMPS/RUBELLA IMMUNITY
MUMPS ABS, IGG: 228 AU/mL (ref >10.9)
RUBELLA: 1.87 {index} (ref >0.99)
RUBEOLA AB, IGG: <25 AU/mL — ABNORMAL LOW (ref >29.9)

## 2018-05-02 LAB — HEPATITIS A ANTIBODY, TOTAL: Hep A Total Ab: POSITIVE — AB

## 2018-05-02 LAB — HEPATITIS B SURFACE ANTIBODY, QUANTITATIVE: Hepatitis B Surf Ab Quant: 4.7 m[IU]/mL — ABNORMAL LOW (ref >9.9)

## 2018-05-05 ENCOUNTER — Encounter: Payer: Self-pay | Admitting: Internal Medicine

## 2018-05-06 ENCOUNTER — Ambulatory Visit
Admission: RE | Admit: 2018-05-06 | Discharge: 2018-05-06 | Disposition: A | Discharge status: Home / Self Care | Payer: 59 | Source: Ambulatory Visit | Attending: Internal Medicine | Admitting: Internal Medicine

## 2018-05-06 DIAGNOSIS — J320 Chronic maxillary sinusitis: Secondary | ICD-10-CM | POA: Insufficient documentation

## 2018-05-06 DIAGNOSIS — J322 Chronic ethmoidal sinusitis: Secondary | ICD-10-CM | POA: Diagnosis not present

## 2018-05-06 DIAGNOSIS — J329 Chronic sinusitis, unspecified: Secondary | ICD-10-CM | POA: Diagnosis present

## 2018-05-08 ENCOUNTER — Ambulatory Visit (INDEPENDENT_AMBULATORY_CARE_PROVIDER_SITE_OTHER): Payer: 59

## 2018-05-08 DIAGNOSIS — Z23 Encounter for immunization: Secondary | ICD-10-CM

## 2018-05-08 NOTE — Progress Notes (Signed)
Patient comes in today for his first dose of hepatitis B vaccine. Administered in left deltoid IM.Patient tolerated well and was informed to schedule nurse visit in 1 month for second dose.

## 2018-06-10 ENCOUNTER — Ambulatory Visit (INDEPENDENT_AMBULATORY_CARE_PROVIDER_SITE_OTHER): Payer: 59

## 2018-06-10 DIAGNOSIS — Z23 Encounter for immunization: Secondary | ICD-10-CM

## 2018-06-10 NOTE — Progress Notes (Signed)
B12 injection given. No issues. LG

## 2018-06-10 NOTE — Progress Notes (Addendum)
Patient presented for Hep B injection to left deltoid, patient voiced no concerns nor showed any signs of distress during injection. 

## 2018-07-30 ENCOUNTER — Encounter: Payer: Self-pay | Admitting: Internal Medicine

## 2018-09-02 ENCOUNTER — Ambulatory Visit (INDEPENDENT_AMBULATORY_CARE_PROVIDER_SITE_OTHER): Payer: 59 | Admitting: Internal Medicine

## 2018-09-02 ENCOUNTER — Encounter: Payer: Self-pay | Admitting: Internal Medicine

## 2018-09-02 VITALS — BP 130/82 | HR 80 | Temp 98.8°F | Ht 68.0 in | Wt 279.4 lb

## 2018-09-02 DIAGNOSIS — R748 Abnormal levels of other serum enzymes: Secondary | ICD-10-CM

## 2018-09-02 DIAGNOSIS — K760 Fatty (change of) liver, not elsewhere classified: Secondary | ICD-10-CM

## 2018-09-02 DIAGNOSIS — Z1329 Encounter for screening for other suspected endocrine disorder: Secondary | ICD-10-CM | POA: Diagnosis not present

## 2018-09-02 DIAGNOSIS — E785 Hyperlipidemia, unspecified: Secondary | ICD-10-CM

## 2018-09-02 DIAGNOSIS — I10 Essential (primary) hypertension: Secondary | ICD-10-CM

## 2018-09-02 DIAGNOSIS — E291 Testicular hypofunction: Secondary | ICD-10-CM

## 2018-09-02 DIAGNOSIS — J309 Allergic rhinitis, unspecified: Secondary | ICD-10-CM | POA: Diagnosis not present

## 2018-09-02 DIAGNOSIS — E559 Vitamin D deficiency, unspecified: Secondary | ICD-10-CM

## 2018-09-02 DIAGNOSIS — Z1389 Encounter for screening for other disorder: Secondary | ICD-10-CM

## 2018-09-02 DIAGNOSIS — J329 Chronic sinusitis, unspecified: Secondary | ICD-10-CM

## 2018-09-02 MED ORDER — MONTELUKAST SODIUM 10 MG PO TABS: 10.0000 mg | ORAL_TABLET | Freq: Every day | ORAL | 3 refills | Status: DC

## 2018-09-02 NOTE — Patient Instructions (Signed)
Mediterranean Diet A Mediterranean diet refers to food and lifestyle choices that are based on the traditions of countries located on the [employer]. This way of eating has been shown to help prevent certain conditions and improve outcomes for people who have chronic diseases, like kidney disease and heart disease. What are tips for following this plan? Lifestyle  Cook and eat meals together with your family, when possible.  Drink enough fluid to keep your urine clear or pale yellow.  Be physically active every day. This includes: ? Aerobic exercise like running or swimming. ? Leisure activities like gardening, walking, or housework.  Get 7-8 hours of sleep each night.  If recommended by your health care provider, drink red wine in moderation. This means 1 glass a day for nonpregnant women and 2 glasses a day for men. A glass of wine equals 5 oz (150 mL). Reading food labels  Check the serving size of packaged foods. For foods such as rice and pasta, the serving size refers to the amount of cooked product, not dry.  Check the total fat in packaged foods. Avoid foods that have saturated fat or trans fats.  Check the ingredients list for added sugars, such as corn syrup. Shopping  At the grocery store, buy most of your food from the areas near the walls of the store. This includes: ? Fresh fruits and vegetables (produce). ? Grains, beans, nuts, and seeds. Some of these may be available in unpackaged forms or large amounts (in bulk). ? Fresh seafood. ? Poultry and eggs. ? Low-fat dairy products.  Buy whole ingredients instead of prepackaged foods.  Buy fresh fruits and vegetables in-season from local farmers markets.  Buy frozen fruits and vegetables in resealable bags.  If you do not have access to quality fresh seafood, buy precooked frozen shrimp or canned fish, such as tuna, salmon, or sardines.  Buy small amounts of raw or cooked vegetables, salads, or olives from  the deli or salad bar at your store.  Stock your pantry so you always have certain foods on hand, such as olive oil, canned tuna, canned tomatoes, rice, pasta, and beans. Cooking  Cook foods with extra-virgin olive oil instead of using butter or other vegetable oils.  Have meat as a side dish, and have vegetables or grains as your main dish. This means having meat in small portions or adding small amounts of meat to foods like pasta or stew.  Use beans or vegetables instead of meat in common dishes like chili or lasagna.  Experiment with different cooking methods. Try roasting or broiling vegetables instead of steaming or sauteing them.  Add frozen vegetables to soups, stews, pasta, or rice.  Add nuts or seeds for added healthy fat at each meal. You can add these to yogurt, salads, or vegetable dishes.  Marinate fish or vegetables using olive oil, lemon juice, garlic, and fresh herbs. Meal planning  Plan to eat 1 vegetarian meal one day each week. Try to work up to 2 vegetarian meals, if possible.  Eat seafood 2 or more times a week.  Have healthy snacks readily available, such as: ? Vegetable sticks with hummus. ? [place] yogurt. ? Fruit and nut trail mix.  Eat balanced meals throughout the week. This includes: ? Fruit: 2-3 servings a day ? Vegetables: 4-5 servings a day ? Low-fat dairy: 2 servings a day ? Fish, poultry, or lean meat: 1 serving a day ? Beans and legumes: 2 or more servings a week ? Nuts  and seeds: 1-2 servings a day ? Whole grains: 6-8 servings a day ? Extra-virgin olive oil: 3-4 servings a day  Limit red meat and sweets to only a few servings a month What are my food choices?  Mediterranean diet ? Recommended ? Grains: Whole-grain pasta. Brown rice. Bulgar wheat. Polenta. Couscous. Whole-wheat bread. [place]. ? Vegetables: Artichokes. Beets. Broccoli. Cabbage. Carrots. Eggplant. Green beans. Chard. Kale. Spinach. Onions. Leeks. Peas. Squash.  Tomatoes. Peppers. Radishes. ? Fruits: Apples. Apricots. Avocado. Berries. Bananas. Cherries. Dates. Figs. Grapes. Lemons. Melon. Oranges. Peaches. Plums. Pomegranate. ? Meats and other protein foods: Beans. Almonds. Sunflower seeds. Pine nuts. Peanuts. [place]. Salmon. Scallops. Shrimp. [place]. Tilapia. Clams. Oysters. Eggs. ? Dairy: Low-fat milk. Cheese. Greek yogurt. ? Beverages: Water. Red wine. Herbal tea. ? Fats and oils: Extra virgin olive oil. Avocado oil. Grape seed oil. ? Sweets and desserts: [place] yogurt with honey. Baked apples. Poached pears. Trail mix. ? Seasoning and other foods: Basil. Cilantro. Coriander. Cumin. Mint. Parsley. Sage. Rosemary. Tarragon. Garlic. Oregano. Thyme. Pepper. Balsalmic vinegar. Tahini. Hummus. Tomato sauce. Olives. Mushrooms. ? Limit these ? Grains: Prepackaged pasta or rice dishes. Prepackaged cereal with added sugar. ? Vegetables: Deep fried potatoes (french fries). ? Fruits: Fruit canned in syrup. ? Meats and other protein foods: Beef. Pork. Lamb. Poultry with skin. Hot dogs. [place] Salines. ? Dairy: Ice cream. Sour cream. Whole milk. ? Beverages: Juice. Sugar-sweetened soft drinks. Beer. Liquor and spirits. ? Fats and oils: Butter. Canola oil. Vegetable oil. Beef fat (tallow). Lard. ? Sweets and desserts: Cookies. Cakes. Pies. Candy. ? Seasoning and other foods: Mayonnaise. Premade sauces and marinades. ? The items listed may not be a complete list. Talk with your dietitian about what dietary choices are right for you. Summary  The Mediterranean diet includes both food and lifestyle choices.  Eat a variety of fresh fruits and vegetables, beans, nuts, seeds, and whole grains.  Limit the amount of red meat and sweets that you eat.  Talk with your health care provider about whether it is safe for you to drink red wine in moderation. This means 1 glass a day for nonpregnant women and 2 glasses a day for men. A glass of wine equals 5 oz (150 mL). This information  is not intended to replace advice given to you by your health care provider. Make sure you discuss any questions you have with your health care provider. Document Released: 05/31/2016 Document Revised: 07/03/2016 Document Reviewed: 05/31/2016 Elsevier Interactive Patient Education  [employer].     Why follow it? Research shows. . Those who follow the Mediterranean diet have a reduced risk of heart disease  . The diet is associated with a reduced incidence of Parkinson's and Alzheimer's diseases . People following the diet may have longer life expectancies and lower rates of chronic diseases  . The Dietary Guidelines for Americans recommends the Mediterranean diet as an eating plan to promote health and prevent disease  What Is the Mediterranean Diet?  . Healthy eating plan based on typical foods and recipes of Mediterranean-style cooking . The diet is primarily a plant based diet; these foods should make up a majority of meals   Starches - Plant based foods should make up a majority of meals - They are an important sources of vitamins, minerals, energy, antioxidants, and fiber - Choose whole grains, foods high in fiber and minimally processed items  - Typical grain sources include wheat, oats, barley, corn, brown rice, bulgar, farro, millet, polenta, couscous  -  Various types of beans include chickpeas, lentils, fava beans, black beans, white beans   Fruits  Veggies - Large quantities of antioxidant rich fruits & veggies; 6 or more servings  - Vegetables can be eaten raw or lightly drizzled with oil and cooked  - Vegetables common to the traditional Mediterranean Diet include: artichokes, arugula, beets, broccoli, brussel sprouts, cabbage, carrots, celery, collard greens, cucumbers, eggplant, kale, leeks, lemons, lettuce, mushrooms, okra, onions, peas, peppers, potatoes, pumpkin, radishes, rutabaga, shallots, spinach, sweet potatoes, turnips, zucchini - Fruits common to the  Mediterranean Diet include: apples, apricots, avocados, cherries, clementines, dates, figs, grapefruits, grapes, melons, nectarines, oranges, peaches, pears, pomegranates, strawberries, tangerines  Fats - Replace butter and margarine with healthy oils, such as olive oil, canola oil, and tahini  - Limit nuts to no more than a handful a day  - Nuts include walnuts, almonds, pecans, pistachios, pine nuts  - Limit or avoid candied, honey roasted or heavily salted nuts - Olives are central to the [employer] - can be eaten whole or used in a variety of dishes   Meats Protein - Limiting red meat: no more than a few times a month - When eating red meat: choose lean cuts and keep the portion to the size of deck of cards - Eggs: approx. 0 to 4 times a week  - Fish and lean poultry: at least 2 a week  - Healthy protein sources include, chicken, [place], lean beef, lamb - Increase intake of seafood such as tuna, salmon, trout, mackerel, shrimp, scallops - Avoid or limit high fat processed meats such as sausage and bacon  Dairy - Include moderate amounts of low fat dairy products  - Focus on healthy dairy such as fat free yogurt, skim milk, low or reduced fat cheese - Limit dairy products higher in fat such as whole or 2% milk, cheese, ice cream  Alcohol - Moderate amounts of red wine is ok  - No more than 5 oz daily for women (all ages) and men older than age 9  - No more than 10 oz of wine daily for men younger than 51  Other - Limit sweets and other desserts  - Use herbs and spices instead of salt to flavor foods  - Herbs and spices common to the traditional Mediterranean Diet include: basil, bay leaves, chives, cloves, cumin, fennel, garlic, lavender, marjoram, mint, oregano, parsley, pepper, rosemary, sage, savory, sumac, tarragon, thyme   It's not just a diet, it's a lifestyle:  . The Mediterranean diet includes lifestyle factors typical of those in the region  . Foods, drinks and meals are  best eaten with others and savored . Daily physical activity is important for overall good health . This could be strenuous exercise like running and aerobics . This could also be more leisurely activities such as walking, housework, yard-work, or taking the stairs . Moderation is the key; a balanced and healthy diet accommodates most foods and drinks . Consider portion sizes and frequency of consumption of certain foods   Meal Ideas & Options:  . Breakfast:  o Whole wheat toast or whole wheat English muffins with peanut butter & hard boiled egg o Steel cut oats topped with apples & cinnamon and skim milk  o Fresh fruit: banana, strawberries, melon, berries, peaches  o Smoothies: strawberries, bananas, greek yogurt, peanut butter o Low fat greek yogurt with blueberries and granola  o Egg white omelet with spinach and mushrooms o Breakfast couscous: whole wheat couscous, apricots, skim  milk, cranberries  . Sandwiches:  o Hummus and grilled vegetables (peppers, zucchini, squash) on whole wheat bread   o Grilled chicken on whole wheat pita with lettuce, tomatoes, cucumbers or tzatziki  o Tuna salad on whole wheat bread: tuna salad made with greek yogurt, olives, red peppers, capers, green onions o Garlic rosemary lamb pita: lamb sauted with garlic, rosemary, salt & pepper; add lettuce, cucumber, greek yogurt to pita - flavor with lemon juice and black pepper  . Seafood:  o Mediterranean grilled salmon, seasoned with garlic, basil, parsley, lemon juice and black pepper o Shrimp, lemon, and spinach whole-grain pasta salad made with low fat greek yogurt  o Seared scallops with lemon orzo  o Seared tuna steaks seasoned salt, pepper, coriander topped with tomato mixture of olives, tomatoes, olive oil, minced garlic, parsley, green onions and cappers  . Meats:  o Herbed greek chicken salad with kalamata olives, cucumber, feta  o Red bell peppers stuffed with spinach, bulgur, lean ground beef (or  lentils) & topped with feta   o Kebabs: skewers of chicken, tomatoes, onions, zucchini, squash  o [place] burgers: made with red onions, mint, dill, lemon juice, feta cheese topped with roasted red peppers . Vegetarian o Cucumber salad: cucumbers, artichoke hearts, celery, red onion, feta cheese, tossed in olive oil & lemon juice  o Hummus and whole grain pita points with a greek salad (lettuce, tomato, feta, olives, cucumbers, red onion) o Lentil soup with celery, carrots made with vegetable broth, garlic, salt and pepper  o Tabouli salad: parsley, bulgur, mint, scallions, cucumbers, tomato, radishes, lemon juice, olive oil, salt and pepper.

## 2018-09-02 NOTE — Progress Notes (Signed)
Chief Complaint  Patient presents with  . Follow-up   F/u  1. HTN controlled on norvasc 5 mg qd  2. Sinus issues flaring now with weather change not taking antihistamines  3. Fatty liver reports wt was down to 200s and he is working out 6 days a week doing [employer] type exercise 30-40 minutes    Review of Systems  Constitutional: Negative for weight loss.  HENT: Negative for hearing loss.   Eyes: Negative for blurred vision.  Respiratory: Negative for shortness of breath.   Cardiovascular: Negative for chest pain.  Musculoskeletal: Negative for back pain.  Skin: Negative for rash.  Neurological: Negative for headaches.  Psychiatric/Behavioral: Negative for depression.   Past Medical History:  Diagnosis Date  . Chicken pox   . Hyperlipidemia   . Hypertension   . Nocturnal leg cramps 12/04/2017  . Obesity   . Obstructive sleep apnea    Past Surgical History:  Procedure Laterality Date  . WISDOM TOOTH EXTRACTION     Family History  Problem Relation Age of Onset  . Hypertension Mother   . Diabetes Father   . Cancer Maternal Grandmother        colon, liver  . Hypertension Maternal Grandmother   . Cancer Maternal Grandfather        prostate  . Hypertension Maternal Grandfather   . Anemia Paternal Grandmother   . Cancer Paternal Grandfather        leukemia  . Hypertension Maternal Aunt    Social History   Socioeconomic History  . Marital status: Married    Spouse name: Not on file  . Number of children: 1  . Years of education: 12+  . Highest education level: Not on file  Occupational History  . Occupation: [employer]  Social Needs  . Financial resource strain: Not on file  . Food insecurity:    Worry: Not on file    Inability: Not on file  . Transportation needs:    Medical: Not on file    Non-medical: Not on file  Tobacco Use  . Smoking status: Never Smoker  . Smokeless tobacco: Former Systems developer    Types: Chew  Substance and Sexual Activity  .  Alcohol use: Yes    Alcohol/week: 2.0 standard drinks    Types: 2 Cans of beer per week    Comment: on occasion  . Drug use: No  . Sexual activity: Yes  Lifestyle  . Physical activity:    Days per week: Not on file    Minutes per session: Not on file  . Stress: Not on file  Relationships  . Social connections:    Talks on phone: Not on file    Gets together: Not on file    Attends religious service: Not on file    Active member of club or organization: Not on file    Attends meetings of clubs or organizations: Not on file    Relationship status: Not on file  . Intimate partner violence:    Fear of current or ex partner: Not on file    Emotionally abused: Not on file    Physically abused: Not on file    Forced sexual activity: Not on file  Other Topics Concern  . Not on file  Social History Narrative   Lives with wife   Caffeine use: 1 cup coffee or 1 sugar free redbull   Works as Conservation officer, historic buildings x 16 years    Married  Left-handed   Current Meds  Medication Sig  . albuterol (PROVENTIL HFA;VENTOLIN HFA) 108 (90 Base) MCG/ACT inhaler Inhale 1-2 puffs into the lungs every 4 (four) hours as needed for wheezing or shortness of breath.  Marland Kitchen amLODipine (NORVASC) 5 MG tablet Take 1 tablet (5 mg total) by mouth daily.  . Cholecalciferol 50000 units capsule Take 1 capsule (50,000 Units total) by mouth once a week.  Marland Kitchen ibuprofen (ADVIL,MOTRIN) 200 MG tablet Take 200 mg by mouth.  Marland Kitchen ipratropium (ATROVENT) 0.06 % nasal spray Place 2 sprays into both nostrils 3 (three) times daily.  . multivitamin (ONE-A-DAY MEN'S) TABS tablet Take 1 tablet by mouth daily.   Allergies  Allergen Reactions  . Amoxicillin     Itching/crawling sensation     No results found for this or any previous visit (from the past 2160 hour(s)). Objective  Body mass index is 42.48 kg/m. Wt Readings from Last 3 Encounters:  09/02/18 279 lb 6.4 oz (126.7 kg)  04/30/18 275 lb (124.7 kg)  01/29/18 264  lb 9.6 oz (120 kg)   Temp Readings from Last 3 Encounters:  09/02/18 98.8 F (37.1 C) (Oral)  04/30/18 98 F (36.7 C) (Oral)  01/29/18 98.5 F (36.9 C) (Oral)   BP Readings from Last 3 Encounters:  09/02/18 130/82  04/30/18 130/86  01/29/18 108/76   Pulse Readings from Last 3 Encounters:  09/02/18 80  04/30/18 77  01/29/18 77    Physical Exam  Constitutional: He is oriented to person, place, and time. Vital signs are normal. He appears well-developed and well-nourished. He is cooperative.  HENT:  Head: Normocephalic and atraumatic.  Mouth/Throat: Oropharynx is clear and moist and mucous membranes are normal.  Eyes: Pupils are equal, round, and reactive to light. Conjunctivae are normal.  Cardiovascular: Normal rate, regular rhythm and normal heart sounds.  Pulmonary/Chest: Effort normal and breath sounds normal.  Neurological: He is alert and oriented to person, place, and time. Gait normal.  Skin: Skin is warm, dry and intact.  Psychiatric: He has a normal mood and affect. His speech is normal and behavior is normal. Judgment and thought content normal. Cognition and memory are normal.  Nursing note and vitals reviewed.   Assessment   1. HTN 2. Sinusitis  3. Fatty liver  4. HM Plan   1. Cont norvasc 5 mg qd  2. Add singulair with otc ah, atrovent and flonase  3. rec exercise to lose  4.  Had flu shot at work Tdap utd  rec MMR has not had yet  3rd hep B vaccine due 10/2018 check test in feb 2020 with other fasting labs  Consider repeat sleep study has been 5 years    Provider: Dr. Olivia Mackie McLean-Scocuzza-[facility]

## 2018-09-02 NOTE — Progress Notes (Signed)
Pre visit review using our clinic review tool, if applicable. No additional management support is needed unless otherwise documented below in the visit note. 

## 2018-11-11 ENCOUNTER — Ambulatory Visit (INDEPENDENT_AMBULATORY_CARE_PROVIDER_SITE_OTHER): Payer: 59

## 2018-11-11 DIAGNOSIS — Z23 Encounter for immunization: Secondary | ICD-10-CM

## 2018-11-11 NOTE — Progress Notes (Signed)
Patient came in today for 3rd dose of Hep B vaccine in RD. Patient tolerated well.

## 2018-12-01 ENCOUNTER — Other Ambulatory Visit (INDEPENDENT_AMBULATORY_CARE_PROVIDER_SITE_OTHER): Payer: 59

## 2018-12-01 DIAGNOSIS — Z1329 Encounter for screening for other suspected endocrine disorder: Secondary | ICD-10-CM

## 2018-12-01 DIAGNOSIS — E291 Testicular hypofunction: Secondary | ICD-10-CM

## 2018-12-01 DIAGNOSIS — E559 Vitamin D deficiency, unspecified: Secondary | ICD-10-CM

## 2018-12-01 DIAGNOSIS — R748 Abnormal levels of other serum enzymes: Secondary | ICD-10-CM

## 2018-12-01 DIAGNOSIS — Z1389 Encounter for screening for other disorder: Secondary | ICD-10-CM

## 2018-12-01 DIAGNOSIS — E785 Hyperlipidemia, unspecified: Secondary | ICD-10-CM

## 2018-12-01 DIAGNOSIS — I10 Essential (primary) hypertension: Secondary | ICD-10-CM

## 2018-12-01 NOTE — Addendum Note (Signed)
Addended by: Leeanne Rio on: 12/01/2018 08:43 AM   Modules accepted: Orders

## 2018-12-02 ENCOUNTER — Encounter: Payer: Self-pay | Admitting: Internal Medicine

## 2018-12-02 ENCOUNTER — Other Ambulatory Visit: Payer: Self-pay | Admitting: Internal Medicine

## 2018-12-02 DIAGNOSIS — E875 Hyperkalemia: Secondary | ICD-10-CM

## 2018-12-02 LAB — CBC WITH DIFFERENTIAL/PLATELET
BASOS ABS: 0.1 10*3/uL (ref 0.0–0.2)
BASOS: 1 %
EOS (ABSOLUTE): 0.1 10*3/uL (ref 0.0–0.4)
Eos: 2 %
HEMATOCRIT: 43.3 % (ref 37.5–51.0)
HEMOGLOBIN: 15.4 g/dL (ref 13.0–17.7)
IMMATURE GRANS (ABS): 0 10*3/uL (ref 0.0–0.1)
Immature Granulocytes: 0 %
LYMPHS: 26 %
Lymphocytes Absolute: 2.2 10*3/uL (ref 0.7–3.1)
MCH: 31.4 pg (ref 26.6–33.0)
MCHC: 35.6 g/dL (ref 31.5–35.7)
MCV: 88 fL (ref 79–97)
MONOCYTES: 8 %
Monocytes Absolute: 0.7 10*3/uL (ref 0.1–0.9)
NEUTROS ABS: 5.4 10*3/uL (ref 1.4–7.0)
Neutrophils: 63 %
Platelets: 299 10*3/uL (ref 150–450)
RBC: 4.91 x10E6/uL (ref 4.14–5.80)
RDW: 12.2 % (ref 11.6–15.4)
WBC: 8.4 10*3/uL (ref 3.4–10.8)

## 2018-12-02 LAB — COMPREHENSIVE METABOLIC PANEL
ALBUMIN: 4.1 g/dL (ref 4.0–5.0)
ALK PHOS: 100 IU/L (ref 39–117)
ALT: 23 IU/L (ref 0–44)
AST: 16 IU/L (ref 0–40)
Albumin/Globulin Ratio: 1.8 (ref 1.2–2.2)
BILIRUBIN TOTAL: 0.5 mg/dL (ref 0.0–1.2)
BUN / CREAT RATIO: 18 (ref 9–20)
BUN: 14 mg/dL (ref 6–20)
CHLORIDE: 102 mmol/L (ref 96–106)
CO2: 21 mmol/L (ref 20–29)
Calcium: 8.8 mg/dL (ref 8.7–10.2)
Creatinine, Ser: 0.79 mg/dL (ref 0.76–1.27)
GFR calc non Af Amer: 113 mL/min/{1.73_m2} (ref >59)
GFR, EST AFRICAN AMERICAN: 131 mL/min/{1.73_m2} (ref >59)
GLUCOSE: 68 mg/dL (ref 65–99)
Globulin, Total: 2.3 g/dL (ref 1.5–4.5)
Potassium: 5.9 mmol/L — ABNORMAL HIGH (ref 3.5–5.2)
SODIUM: 139 mmol/L (ref 134–144)
TOTAL PROTEIN: 6.4 g/dL (ref 6.0–8.5)

## 2018-12-02 LAB — LIPID PANEL
CHOL/HDL RATIO: 5.4 ratio — AB (ref 0.0–5.0)
Cholesterol, Total: 193 mg/dL (ref 100–199)
HDL: 36 mg/dL — ABNORMAL LOW (ref >39)
LDL Calculated: 137 mg/dL — ABNORMAL HIGH (ref 0–99)
TRIGLYCERIDES: 98 mg/dL (ref 0–149)
VLDL Cholesterol Cal: 20 mg/dL (ref 5–40)

## 2018-12-02 LAB — TSH: TSH: 2.09 u[IU]/mL (ref 0.450–4.500)

## 2018-12-02 LAB — VITAMIN D 25 HYDROXY (VIT D DEFICIENCY, FRACTURES): VIT D 25 HYDROXY: 38.8 ng/mL (ref 30.0–100.0)

## 2018-12-02 LAB — TESTOSTERONE: Testosterone: 249 ng/dL — ABNORMAL LOW (ref 264–916)

## 2018-12-03 ENCOUNTER — Other Ambulatory Visit: Payer: Self-pay | Admitting: Internal Medicine

## 2018-12-03 DIAGNOSIS — R7989 Other specified abnormal findings of blood chemistry: Secondary | ICD-10-CM

## 2018-12-03 DIAGNOSIS — E291 Testicular hypofunction: Secondary | ICD-10-CM

## 2018-12-07 LAB — CK TOTAL AND CKMB (NOT AT ARMC)
CK TOTAL: 149 U/L (ref 24–204)
CK-MB Index: 2.6 ng/mL (ref 0.0–10.4)

## 2018-12-19 ENCOUNTER — Other Ambulatory Visit: Payer: 59

## 2018-12-22 ENCOUNTER — Other Ambulatory Visit: Payer: 59

## 2018-12-24 ENCOUNTER — Emergency Department
Admission: EM | Admit: 2018-12-24 | Discharge: 2018-12-25 | Disposition: A | Discharge status: Home / Self Care | Payer: 59 | Attending: Emergency Medicine | Admitting: Emergency Medicine

## 2018-12-24 ENCOUNTER — Telehealth: Payer: Self-pay

## 2018-12-24 ENCOUNTER — Emergency Department: Payer: 59

## 2018-12-24 ENCOUNTER — Other Ambulatory Visit: Payer: Self-pay | Admitting: Family Medicine

## 2018-12-24 ENCOUNTER — Ambulatory Visit
Admission: RE | Admit: 2018-12-24 | Discharge: 2018-12-24 | Disposition: A | Discharge status: Home / Self Care | Payer: 59 | Source: Ambulatory Visit | Attending: Family Medicine | Admitting: Family Medicine

## 2018-12-24 ENCOUNTER — Ambulatory Visit (INDEPENDENT_AMBULATORY_CARE_PROVIDER_SITE_OTHER): Payer: 59 | Admitting: Family Medicine

## 2018-12-24 ENCOUNTER — Other Ambulatory Visit: Payer: Self-pay

## 2018-12-24 ENCOUNTER — Encounter: Payer: Self-pay | Admitting: Family Medicine

## 2018-12-24 VITALS — BP 132/76 | HR 109 | Temp 98.8°F | Ht 68.0 in | Wt 288.2 lb

## 2018-12-24 DIAGNOSIS — N50811 Right testicular pain: Secondary | ICD-10-CM

## 2018-12-24 DIAGNOSIS — I10 Essential (primary) hypertension: Secondary | ICD-10-CM | POA: Insufficient documentation

## 2018-12-24 DIAGNOSIS — Z79899 Other long term (current) drug therapy: Secondary | ICD-10-CM | POA: Insufficient documentation

## 2018-12-24 DIAGNOSIS — R1030 Lower abdominal pain, unspecified: Secondary | ICD-10-CM | POA: Diagnosis not present

## 2018-12-24 LAB — COMPREHENSIVE METABOLIC PANEL
ALBUMIN: 3.9 g/dL (ref 3.5–5.0)
ALK PHOS: 76 U/L (ref 38–126)
ALT: 31 U/L (ref 0–44)
AST: 21 U/L (ref 15–41)
Anion gap: 9 (ref 5–15)
BILIRUBIN TOTAL: 0.7 mg/dL (ref 0.3–1.2)
BUN: 13 mg/dL (ref 6–20)
CO2: 25 mmol/L (ref 22–32)
Calcium: 9.1 mg/dL (ref 8.9–10.3)
Chloride: 106 mmol/L (ref 98–111)
Creatinine, Ser: 0.92 mg/dL (ref 0.61–1.24)
GFR calc Af Amer: >60 mL/min (ref >60)
GLUCOSE: 101 mg/dL — AB (ref 70–99)
POTASSIUM: 4.2 mmol/L (ref 3.5–5.1)
Sodium: 140 mmol/L (ref 135–145)
Total Protein: 7.1 g/dL (ref 6.5–8.1)

## 2018-12-24 LAB — CBC WITH DIFFERENTIAL/PLATELET
ABS IMMATURE GRANULOCYTES: 0.04 10*3/uL (ref 0.00–0.07)
BASOS ABS: 0.1 10*3/uL (ref 0.0–0.1)
Basophils Relative: 0 %
EOS ABS: 0.1 10*3/uL (ref 0.0–0.5)
Eosinophils Relative: 1 %
HEMATOCRIT: 44.4 % (ref 39.0–52.0)
Hemoglobin: 15.6 g/dL (ref 13.0–17.0)
IMMATURE GRANULOCYTES: 0 %
Lymphocytes Relative: 20 %
Lymphs Abs: 2.4 10*3/uL (ref 0.7–4.0)
MCH: 31 pg (ref 26.0–34.0)
MCHC: 35.1 g/dL (ref 30.0–36.0)
MCV: 88.1 fL (ref 80.0–100.0)
MONOS PCT: 5 %
Monocytes Absolute: 0.6 10*3/uL (ref 0.1–1.0)
NEUTROS PCT: 74 %
NRBC: 0 % (ref 0.0–0.2)
Neutro Abs: 8.7 10*3/uL — ABNORMAL HIGH (ref 1.7–7.7)
Platelets: 288 10*3/uL (ref 150–400)
RBC: 5.04 MIL/uL (ref 4.22–5.81)
RDW: 11.9 % (ref 11.5–15.5)
WBC: 11.9 10*3/uL — ABNORMAL HIGH (ref 4.0–10.5)

## 2018-12-24 LAB — POC URINALSYSI DIPSTICK (AUTOMATED)
Bilirubin, UA: NEGATIVE
Blood, UA: NEGATIVE
GLUCOSE UA: NEGATIVE
KETONES UA: NEGATIVE
LEUKOCYTES UA: NEGATIVE
NITRITE UA: NEGATIVE
PH UA: 6 (ref 5.0–8.0)
Protein, UA: NEGATIVE
Spec Grav, UA: 1.02 (ref 1.010–1.025)
Urobilinogen, UA: 0.2 E.U./dL

## 2018-12-24 MED ORDER — LEVOFLOXACIN 500 MG PO TABS: 500.0000 mg | ORAL_TABLET | Freq: Every day | ORAL | 0 refills | Status: AC

## 2018-12-24 MED ORDER — TRAMADOL HCL 50 MG PO TABS: 50.0000 mg | ORAL_TABLET | Freq: Four times a day (QID) | ORAL | 0 refills | Status: DC | PRN

## 2018-12-24 MED ORDER — IOHEXOL 300 MG/ML  SOLN
125.0000 mL | Freq: Once | INTRAMUSCULAR | Status: AC | PRN
  Administered 2018-12-24: 125 mL via INTRAVENOUS

## 2018-12-24 NOTE — Telephone Encounter (Signed)
Returned pt's call back.  Pt stated that he was doing some heavy lifting over the weekend and said that his right groin area started hurting on Sunday night and pain increased after being at work yesterday walking up and down stairs.  Pt stated that his bladder area and waist was hurting with sharp, shooting pain.  Pt said that it is also hard for him to walk and it hurts when he sneezes or coughs.    Pt said that he has already made an appt at the [facility] location for today at 5:00 pm just in case Dr. Aundra Dubin did not have any appts.  Informed pt to keep his appt at [facility] location since Dr. Aundra Dubin nor does any other provider at this location have any available appts for today.  Advised pt to go to [facility] or ED if symptoms worsen before his appt today.  Informed pt that I will forward notes to Dr. Aundra Dubin as Juluis Rainier.

## 2018-12-24 NOTE — ED Provider Notes (Signed)
[facility] Emergency Department Provider Note  ____________________________________________   I have reviewed the triage vital signs and the nursing notes.   HISTORY  Chief Complaint Testicle Pain   History limited by: Not Limited   HPI Caleb [name] is a 38 y.o. male who presents to the emergency department today because of concerns for possible inguinal hernia seen on ultrasound today.  Patient saw his doctor today because of concerns for 3-day history of right testicular pain.  Patient states the pain has been constant.  It is somewhat better when he is lying flat.  Worse with standing and movement.  Denies any trauma.  Had ultrasound performed as an outpatient which did not show any concerning findings of the testicles but raise concern for possible inguinal hernia.  Patient denies any nausea or vomiting.  Has been having normal bowel movements.  No change in urination.  No fevers.  Per medical record review patient has a history of HLD, HTN.   Past Medical History:  Diagnosis Date  . Chicken pox   . Hyperlipidemia   . Hypertension   . Nocturnal leg cramps 12/04/2017  . Obesity   . Obstructive sleep apnea     Patient Active Problem List   Diagnosis Date Noted  . Right testicular pain 12/24/2018  . Sinusitis 04/30/2018  . Fatty liver 01/29/2018  . Acute bronchitis 01/07/2018  . Nocturnal leg cramps 12/04/2017  . Polyarthralgia 12/28/2016  . Pain in both feet 12/04/2015  . Obesity (BMI 30-39.9) 12/04/2015  . Preventative health care 12/04/2015  . Hypertension 06/16/2015  . Hyperlipidemia 06/16/2015  . Obstructive sleep apnea 06/16/2015    Past Surgical History:  Procedure Laterality Date  . WISDOM TOOTH EXTRACTION      Prior to Admission medications   Medication Sig Start Date End Date Taking? Authorizing Provider  amLODipine (NORVASC) 5 MG tablet Take 1 tablet (5 mg total) by mouth daily. 04/30/18   McLean-Scocuzza, Nino Glow, MD   Cholecalciferol (VITAMIN D3 PO) Take by mouth. Takes 5000 units daily    [provider]  ibuprofen (ADVIL,MOTRIN) 200 MG tablet Take 200 mg by mouth. As needed    [provider]  montelukast (SINGULAIR) 10 MG tablet Take 1 tablet (10 mg total) by mouth at bedtime. 09/02/18   McLean-Scocuzza, Nino Glow, MD  multivitamin (ONE-A-DAY MEN'S) TABS tablet Take 1 tablet by mouth daily.    [provider]    Allergies Amoxicillin  Family History  Problem Relation Age of Onset  . Hypertension Mother   . Other Mother        fatty liver  . Diabetes Father   . Cancer Maternal Grandmother        colon, liver  . Hypertension Maternal Grandmother   . Cancer Maternal Grandfather        prostate  . Hypertension Maternal Grandfather   . Anemia Paternal Grandmother   . Cancer Paternal Grandfather        leukemia  . Hypertension Maternal Aunt     Social History Social History   Tobacco Use  . Smoking status: Never Smoker  . Smokeless tobacco: Former Systems developer    Types: Chew  Substance Use Topics  . Alcohol use: Yes    Alcohol/week: 2.0 standard drinks    Types: 2 Cans of beer per week    Comment: on occasion  . Drug use: No    Review of Systems Constitutional: No fever/chills Eyes: No visual changes. ENT: No sore throat.  Cardiovascular: Denies chest pain. Respiratory: Denies shortness of breath. Gastrointestinal: No abdominal pain.  No nausea, no vomiting.  No diarrhea.   Genitourinary: Positive for right testicular pain. Musculoskeletal: Negative for back pain. Skin: Negative for rash. Neurological: Negative for headaches, focal weakness or numbness.  ____________________________________________   PHYSICAL EXAM:  VITAL SIGNS: ED Triage Vitals  Enc Vitals Group     BP 12/24/18 1956 126/83     Pulse Rate 12/24/18 1956 100     Resp 12/24/18 1956 18     Temp 12/24/18 1956 98.5 F (36.9 C)     Temp Source 12/24/18 1956 Oral     SpO2 12/24/18 1956 95  %     Weight 12/24/18 1956 270 lb (122.5 kg)     Height 12/24/18 1956 5\' 7"  (1.702 m)     Head Circumference --      Peak Flow --      Pain Score 12/24/18 2001 0   Constitutional: Alert and oriented.  Eyes: Conjunctivae are normal.  ENT      Head: Normocephalic and atraumatic.      Nose: No congestion/rhinnorhea.      Mouth/Throat: Mucous membranes are moist.      Neck: No stridor. Hematological/Lymphatic/Immunilogical: No cervical lymphadenopathy. Cardiovascular: Normal rate, regular rhythm.  No murmurs, rubs, or gallops.  Respiratory: Normal respiratory effort without tachypnea nor retractions. Breath sounds are clear and equal bilaterally. No wheezes/rales/rhonchi. Gastrointestinal: Soft and non tender. No rebound. No guarding.  Genitourinary: No testicular swelling or erythema. Extremely tender to palpation of the right testicle. No mass appreciated.  Musculoskeletal: Normal range of motion in all extremities. No lower extremity edema. Neurologic:  Normal speech and language. No gross focal neurologic deficits are appreciated.  Skin:  Skin is warm, dry and intact. No rash noted. Psychiatric: Mood and affect are normal. Speech and behavior are normal. Patient exhibits appropriate insight and judgment.  ____________________________________________    LABS (pertinent positives/negatives)  CMP wnl except glu 101 CBC wbc 11.9, hgb 15.6, plt 288  ____________________________________________   EKG  None  ____________________________________________    RADIOLOGY  CT scan No hernia  ____________________________________________   PROCEDURES  Procedures  ____________________________________________   INITIAL IMPRESSION / ASSESSMENT AND PLAN / ED COURSE  Pertinent labs & imaging results that were available during my care of the patient were reviewed by me and considered in my medical decision making (see chart for details).   Patient presented to the emergency  department today because of concerns for possible inguinal hernia seen on outpatient ultrasound for evaluation of right testicular pain.  In review of that ultrasound read no other concerning findings.  Patient did undergo CT scan here which did not show any inguinal hernia or other obvious etiology of the patient's pain.  This point I do think possible epididymitis.  Patient states he is in a monogamous relationship with his wife. Will plan on treating with pain medication and discussed importance of follow up with urology. He states he does have an appointment already scheduled with urology in a week.   ____________________________________________   FINAL CLINICAL IMPRESSION(S) / ED DIAGNOSES  Final diagnoses:  Right testicular pain     Note: This dictation was prepared with Dragon dictation. Any transcriptional errors that result from this process are unintentional     Nance Pear, MD 12/24/18 2336

## 2018-12-24 NOTE — Discharge Instructions (Addendum)
Please seek medical attention for any high fevers, chest pain, shortness of breath, change in behavior, persistent vomiting, bloody stool or any other new or concerning symptoms.  

## 2018-12-24 NOTE — Progress Notes (Addendum)
BP 132/76 (BP Location: Left Arm, Patient Position: Sitting, Cuff Size: Large)   Pulse (!) 109   Temp 98.8 F (37.1 C) (Oral)   Ht 5\' 8"  (1.727 m)   Wt 288 lb 4 oz (130.7 kg)   SpO2 94%   BMI 43.83 kg/m    CC: R groin pain Subjective:    Patient ID: Caleb [name], male    DOB: 01/14/1981, 38 y.o.   MRN: [id number]  HPI: Caleb [name] is a 38 y.o. male presenting on 12/24/2018 for Groin Pain (C/o right side groin pain radiating up into RLQ of abd. Started 12/21/18. States he was moving furniture over the weekend but does not recall any injury. Tried Motrin, barely helpful. )   3d h/o R groin pain that started after moving furniture over weekend. Pain present all this week. Hasn't noticed any bulging. R testicle very painful to touch. Pain improves when supine, but worse with standing or walking.   No fevers/chills, nausea/vomiting, diarrhea/constipation, blood in stool or urine. No rectal or lower back pain. No dysuria or other urinary symptoms.   Potassium elevated on recent labwork - has upcoming appt for recheck at PCP's office.      Relevant past medical, surgical, family and social history reviewed and updated as indicated. Interim medical history since our last visit reviewed. Allergies and medications reviewed and updated. Outpatient Medications Prior to Visit  Medication Sig Dispense Refill  . amLODipine (NORVASC) 5 MG tablet Take 1 tablet (5 mg total) by mouth daily. 90 tablet 3  . Cholecalciferol (VITAMIN D3 PO) Take by mouth. Takes 5000 units daily    . ibuprofen (ADVIL,MOTRIN) 200 MG tablet Take 200 mg by mouth. As needed    . montelukast (SINGULAIR) 10 MG tablet Take 1 tablet (10 mg total) by mouth at bedtime. 90 tablet 3  . multivitamin (ONE-A-DAY MEN'S) TABS tablet Take 1 tablet by mouth daily.    Marland Kitchen albuterol (PROVENTIL HFA;VENTOLIN HFA) 108 (90 Base) MCG/ACT inhaler Inhale 1-2 puffs into the lungs every 4 (four) hours as needed for wheezing or shortness of breath. 1  Inhaler 2  . Cholecalciferol 50000 units capsule Take 1 capsule (50,000 Units total) by mouth once a week. 13 capsule 1  . ipratropium (ATROVENT) 0.06 % nasal spray Place 2 sprays into both nostrils 3 (three) times daily. 15 mL 12   No facility-administered medications prior to visit.      Per HPI unless specifically indicated in ROS section below Review of Systems Objective:    BP 132/76 (BP Location: Left Arm, Patient Position: Sitting, Cuff Size: Large)   Pulse (!) 109   Temp 98.8 F (37.1 C) (Oral)   Ht 5\' 8"  (1.727 m)   Wt 288 lb 4 oz (130.7 kg)   SpO2 94%   BMI 43.83 kg/m   Wt Readings from Last 3 Encounters:  12/24/18 288 lb 4 oz (130.7 kg)  09/02/18 279 lb 6.4 oz (126.7 kg)  04/30/18 275 lb (124.7 kg)    Physical Exam Vitals signs and nursing note reviewed.  Constitutional:      Appearance: Normal appearance. He is not ill-appearing.  Abdominal:     General: Abdomen is flat. Bowel sounds are normal. There is no distension.     Palpations: Abdomen is soft. There is no mass.     Tenderness: There is no abdominal tenderness. There is no right CVA tenderness, left CVA tenderness, guarding or rebound.     Hernia: No hernia is  present. There is no hernia in the right inguinal area or left inguinal area.  Genitourinary:    Penis: Normal.      Scrotum/Testes:        Right: Tenderness and swelling present. Mass not present. Right testis is descended.        Left: Mass, tenderness or swelling not present. Left testis is descended.     Epididymis:     Right: Tenderness present.     Left: No tenderness.  Lymphadenopathy:     Lower Body: No right inguinal adenopathy. No left inguinal adenopathy.  Neurological:     Mental Status: He is alert.       Results for orders placed or performed in visit on 12/24/18  POCT Urinalysis Dipstick (Automated)  Result Value Ref Range   Color, UA yellow    Clarity, UA clear    Glucose, UA Negative Negative   Bilirubin, UA negative     Ketones, UA negative    Spec Grav, UA 1.020 1.010 - 1.025   Blood, UA negative    pH, UA 6.0 5.0 - 8.0   Protein, UA Negative Negative   Urobilinogen, UA 0.2 0.2 or 1.0 E.U./dL   Nitrite, UA negative    Leukocytes, UA Negative Negative   US SCROTUM W/DOPPLER CLINICAL DATA:  Right testicle and groin pain  EXAM: SCROTAL ULTRASOUND  DOPPLER ULTRASOUND OF THE TESTICLES  TECHNIQUE: Complete ultrasound examination of the testicles, epididymis, and other scrotal structures was performed. Color and spectral Doppler ultrasound were also utilized to evaluate blood flow to the testicles.  COMPARISON:  None.  FINDINGS: Right testicle  Measurements: 3.9 x 2.3 x 2.6 cm. No mass or microlithiasis visualized.  Left testicle  Measurements: 4.6 x 1.8 x 2.8 cm. No mass or microlithiasis visualized.  Right epididymis:  Normal in size and appearance.  Left epididymis:  Normal in size and appearance.  Hydrocele:  Small right greater than left hydroceles.  Varicocele:  None visualized.  Pulsed Doppler interrogation of both testes demonstrates normal low resistance arterial and venous waveforms bilaterally.  There is echogenic tissue within the right inguinal region  IMPRESSION: 1. Negative for acute testicular torsion. 2. Echogenic tissue within the right inguinal region is suspicious for a hernia, possibly incarcerated. Correlation with CT is recommended.  Electronically Signed   By: Donavan Foil M.D.   On: 12/24/2018 19:27   Assessment & Plan:   Problem List Items Addressed This Visit    Right testicular pain - Primary    Acute R testicular pain and swelling of 3 d duration. Possible epididymo orchitis however need to r/o testicular torsion. Will call to see if [facility] can do stat US/doppler today or if we need to send to ER.  UA today reassuringly normal. No testicular masses appreciated. No obvious inguinal hernia appreciated today.   ADDENDUM ==> [place] negative for  testicular torsion or testicular etiology, concerning for incarcerated hernia. Discussed with patient, recommended ER eval tonight. Will call [facility] ER to notify pt on his way.       Relevant Orders   POCT Urinalysis Dipstick (Automated) (Completed)   US SCROTUM W/DOPPLER       No orders of the defined types were placed in this encounter.  Orders Placed This Encounter  Procedures  . US SCROTUM W/DOPPLER    Patient to wait until report called    Standing Status:   Future    Standing Expiration Date:   02/23/2020    Order Specific Question:  Reason for Exam (SYMPTOM  OR DIAGNOSIS REQUIRED)    Answer:   R testicular pain and swelling    Order Specific Question:   Preferred imaging location?    Answer:   [facility]    Order Specific Question:   Call Results- Best Contact Number?    Answer:   call to [phone] / [phone]  . POCT Urinalysis Dipstick (Automated)    Follow up plan: No follow-ups on file.  Ria Bush, MD

## 2018-12-24 NOTE — ED Notes (Signed)
Pt arrives at ED from [facility] with c/c of R side testicular pain. Pt just had [place] in [facility] mall and was sent here for recommended CT to rule out inguinal hernia. Pt A&O x4 at time of assessment and in no acute distress.

## 2018-12-24 NOTE — Telephone Encounter (Signed)
Copied from [place] [id number]. Topic: Appointment Scheduling - Scheduling Inquiry for Clinic >> Dec 24, 2018  7:40 AM Scherrie Gerlach wrote: Reason for CRM: pt was moving furniture over the weekend and now thinks he has a hernia. Pt took off work today and was hoping to see his doctor.  No appts today.  Please advise if dr can work in.

## 2018-12-24 NOTE — Telephone Encounter (Signed)
Called pt back per PCP.  Made pt aware of walk-in hours at [facility].  Pt confirmed that he has an appt today @ 5:00 pm at [facility] location.

## 2018-12-24 NOTE — Telephone Encounter (Signed)
Also donna [facility] has walk in hours 1-7 pm daily   [place]   [employer]

## 2018-12-24 NOTE — ED Triage Notes (Signed)
Pt to the er to be evaluated for inguinal hernia. Pt went to his doctor today for right side testicle pain and had a UA and sent here to [facility] for a [place]. Results to MD showed possible inguinal hernia. Pt instructed to come to the ER for a CT to rule out. Pt says pain is consistent and has not worsened.

## 2018-12-24 NOTE — ED Notes (Signed)
Patient transported to CT 

## 2018-12-24 NOTE — Patient Instructions (Signed)
Go straight to [facility] for testicular ultrasound - we will be in touch with results.

## 2018-12-24 NOTE — Assessment & Plan Note (Addendum)
Acute R testicular pain and swelling of 3 d duration. Possible epididymo orchitis however need to r/o testicular torsion. Will call to see if [facility] can do stat US/doppler today or if we need to send to ER.  UA today reassuringly normal. No testicular masses appreciated. No obvious inguinal hernia appreciated today.   ADDENDUM ==> [place] negative for testicular torsion or testicular etiology, concerning for incarcerated hernia. Discussed with patient, recommended ER eval tonight. Will call [facility] ER to notify pt on his way.

## 2018-12-25 ENCOUNTER — Encounter: Payer: Self-pay | Admitting: Family Medicine

## 2018-12-25 ENCOUNTER — Ambulatory Visit: Admission: RE | Admit: 2018-12-25 | Payer: 59 | Source: Ambulatory Visit

## 2018-12-25 NOTE — Telephone Encounter (Signed)
Is it ok to print the note for dates requested?

## 2018-12-26 ENCOUNTER — Other Ambulatory Visit (INDEPENDENT_AMBULATORY_CARE_PROVIDER_SITE_OTHER): Payer: 59

## 2018-12-26 DIAGNOSIS — Z1389 Encounter for screening for other disorder: Secondary | ICD-10-CM

## 2018-12-26 DIAGNOSIS — E875 Hyperkalemia: Secondary | ICD-10-CM

## 2018-12-26 NOTE — Telephone Encounter (Signed)
Yes ok to do - thank you. I asked pt if he wanted it mailed or if he wanted to pick it up.

## 2018-12-27 ENCOUNTER — Other Ambulatory Visit: Payer: Self-pay | Admitting: Internal Medicine

## 2018-12-27 DIAGNOSIS — R739 Hyperglycemia, unspecified: Secondary | ICD-10-CM

## 2018-12-27 DIAGNOSIS — Z1159 Encounter for screening for other viral diseases: Secondary | ICD-10-CM

## 2018-12-27 DIAGNOSIS — Z0184 Encounter for antibody response examination: Secondary | ICD-10-CM

## 2018-12-27 DIAGNOSIS — K760 Fatty (change of) liver, not elsewhere classified: Secondary | ICD-10-CM

## 2018-12-27 LAB — URINALYSIS, ROUTINE W REFLEX MICROSCOPIC
Bilirubin, UA: NEGATIVE
Glucose, UA: NEGATIVE
KETONES UA: NEGATIVE
Leukocytes, UA: NEGATIVE
Nitrite, UA: NEGATIVE
PROTEIN UA: NEGATIVE
RBC UA: NEGATIVE
Specific Gravity, UA: 1.019 (ref 1.005–1.030)
Urobilinogen, Ur: 0.2 mg/dL (ref 0.2–1.0)
pH, UA: 6 (ref 5.0–7.5)

## 2018-12-27 LAB — BASIC METABOLIC PANEL
BUN / CREAT RATIO: 16 (ref 9–20)
BUN: 14 mg/dL (ref 6–20)
CO2: 21 mmol/L (ref 20–29)
Calcium: 9.2 mg/dL (ref 8.7–10.2)
Chloride: 101 mmol/L (ref 96–106)
Creatinine, Ser: 0.89 mg/dL (ref 0.76–1.27)
GFR calc Af Amer: 125 mL/min/{1.73_m2} (ref >59)
GFR calc non Af Amer: 108 mL/min/{1.73_m2} (ref >59)
Glucose: 139 mg/dL — ABNORMAL HIGH (ref 65–99)
POTASSIUM: 3.9 mmol/L (ref 3.5–5.2)
SODIUM: 140 mmol/L (ref 134–144)

## 2018-12-29 LAB — SPECIMEN STATUS REPORT

## 2018-12-29 NOTE — Telephone Encounter (Signed)
Printed letter. Waiting to hear if pt wants to pick up or have [place] mail it.   Lemont Fillers is in Copy on [employer]

## 2018-12-30 LAB — HEPATITIS B SURFACE ANTIBODY, QUANTITATIVE: Hepatitis B Surf Ab Quant: 61.4 m[IU]/mL (ref >9.9)

## 2018-12-30 LAB — SPECIMEN STATUS REPORT

## 2018-12-30 NOTE — Telephone Encounter (Signed)
Mailed note to pt per his request.

## 2018-12-31 ENCOUNTER — Other Ambulatory Visit: Payer: Self-pay

## 2018-12-31 ENCOUNTER — Encounter: Payer: Self-pay | Admitting: Urology

## 2018-12-31 ENCOUNTER — Ambulatory Visit (INDEPENDENT_AMBULATORY_CARE_PROVIDER_SITE_OTHER): Payer: 59 | Admitting: Urology

## 2018-12-31 ENCOUNTER — Ambulatory Visit: Payer: 59 | Admitting: Urology

## 2018-12-31 ENCOUNTER — Encounter

## 2018-12-31 VITALS — BP 128/82 | HR 87 | Ht 67.0 in | Wt 285.3 lb

## 2018-12-31 DIAGNOSIS — E291 Testicular hypofunction: Secondary | ICD-10-CM

## 2018-12-31 DIAGNOSIS — N5082 Scrotal pain: Secondary | ICD-10-CM | POA: Diagnosis not present

## 2018-12-31 NOTE — Patient Instructions (Signed)
Testosterone Replacement Therapy  Testosterone replacement therapy (TRT) is used to treat men who have a low testosterone level (hypogonadism). Testosterone is a male hormone that is produced in the testicles. It is responsible for typically male characteristics and for maintaining a man's sex drive and the ability to get an erection. Testosterone also supports bone and muscle health. TRT can be a gel, liquid, or patch that you put on your skin. It can also be in the form of a tablet or an injection. In some cases, your health care provider may insert long-acting pellets under your skin. In most men, the level of testosterone starts to decline gradually after age 58. Low testosterone can also be caused by certain medical conditions, medicines, and obesity. Your health care provider can diagnose hypogonadism with at least two blood tests that are done early in the morning. Low testosterone may not need to be treated. TRT is usually a choice that you make with your health care provider. Your health care provider may recommend TRT if you have low testosterone that is causing symptoms, such as:  Low sex drive.  Erection problems.  Breast enlargement.  Loss of body hair.  Weak muscles or bones.  Shrinking testicles.  Increased body fat.  Low energy.  Hot flashes.  Depression.  Decreased work Systems analyst. TRT is a lifetime treatment. If you stop treatment, your testosterone will drop, and your symptoms may return. What are the risks? Testosterone replacement therapy may have side effects, including:  Lower sperm count.  Skin irritation at the application or injection site.  Mouth irritation if you take an oral tablet.  Acne.  Swelling of your legs or feet.  Tender breasts.  Dizziness.  Sleep disturbance.  Mood swings.  Possible increased risk of stroke or heart attack. Testosterone replacement therapy may also increase your risk for prostate cancer or male breast cancer.  You should not use TRT if you have either of those conditions. Your health care provider also may not recommend TRT if:  You are suspected of having prostate cancer.  You want to father a child.  You have a high number of red blood cells.  You have untreated sleep apnea.  You have a very large prostate. Supplies needed:  Your health care provider will prescribe the testosterone gel, solution, or medicine that you need. If your health care provider teaches you to do self-injections at home, you will also need: ? Your medicine vial. ? Disposable needles and syringes. ? Alcohol swabs. ? A needle disposal container. ? Adhesive bandages. How to use testosterone replacement therapy Your health care provider will help you find the TRT option that will work best for you based on your preference, the side effects, and the cost. You may:  Rub testosterone gel on your upper arm or shoulder every day after a shower. This is the most common type of TRT. Do not let women or children come in contact with the gel.  Apply a testosterone solution under your arms once each day.  Place a testosterone patch on your skin once each day.  Dissolve a testosterone tablet in your mouth twice each day.  Have a testosterone pellet inserted under your skin by your health care provider. This will be replaced every 3-6 months.  Use testosterone nasal spray three times each day.  Get testosterone injections. For some types of testosterone, your health care provider will give you this injection. With other types of testosterone, you may be taught to give injections to  provider. This will be replaced every 3-6 months.  · Use testosterone nasal spray three times each day.  · Get testosterone injections. For some types of testosterone, your health care provider will give you this injection. With other types of testosterone, you may be taught to give injections to yourself. The frequency of injections may vary based on the type of testosterone that you receive.  Follow these instructions at home:  · Take over-the-counter and prescription medicines only as told by your health care provider.  · Lose weight if you are overweight. Ask your health care provider to help you start a healthy diet and exercise program to  reach and maintain a healthy weight.  · Work with your health care provider to treat other medical conditions that may lower your testosterone. These include obesity, high blood pressure, high cholesterol, diabetes, liver disease, kidney disease, and sleep apnea.  · Keep all follow-up visits as told by your health care provider. This is important.  General recommendations  · Discuss all risks and benefits with your health care provider before starting therapy.  · Work with your health care provider to check your prostate health and do blood testing before you start therapy.  · Do not use any testosterone replacement therapies that are not prescribed by your health care provider or not approved for use in the [place]  · Do not use TRT for bodybuilding or to improve sexual performance. TRT should be used only to treat symptoms of low testosterone.  · Return for all repeat prostate checks and blood tests during therapy, as told by your health care provider.  Where to find more information  Learn more about testosterone replacement therapy from:  · American Urological Foundation: www.urologyhealth.org/urologic-conditions/low-testosterone-(hypogonadism)  · Endocrine Society: www.hormone.org/diseases-and-conditions/mens-health/hypogonadism  Contact a health care provider if:  · You have side effects from your testosterone replacement therapy.  · You continue to have symptoms of low testosterone during treatment.  · You develop new symptoms during treatment.  Summary  · Testosterone replacement therapy is only for men who have low testosterone as determined by blood testing and who have symptoms of low testosterone.  · Testosterone replacement therapy should be prescribed only by a health care provider and should be used under the supervision of a health care provider.  · You may not be able to take testosterone if you have certain medical conditions, including prostate cancer, male breast cancer, or heart  disease.  · Testosterone replacement therapy may have side effects and may make some medical conditions worse.  · Talk with your health care provider about all the risks and benefits before you start therapy.  This information is not intended to replace advice given to you by your health care provider. Make sure you discuss any questions you have with your health care provider.  Document Released: 06/28/2016 Document Revised: 06/28/2016 Document Reviewed: 06/28/2016  Elsevier Interactive Patient Education © 2019 [employer]

## 2018-12-31 NOTE — Progress Notes (Signed)
12/31/2018 4:50 PM   Caleb [name] 04/10/1981 [id number]  Referring provider: McLean-Scocuzza, Nino Glow, MD [place], [place] [address]  Chief Complaint  Patient presents with  . Hypogonadism    HPI: 38 year old male seen in consultation at the request of Dr. Terese Door for evaluation of hypogonadism.  He presents with a several month history of significant tiredness, fatigue, increased weight gain and decreased muscle mass despite working out regularly.  He has good libido and denies erectile dysfunction.  A morning testosterone level checked on 12/01/2018 was slightly low at 249 ng/dL.  He also presented to the ED on 12/24/2018 complaining of a 3-day history of right hemiscrotal pain.  He had a scrotal ultrasound performed which showed no evidence of torsion however a questionable incarcerated inguinal hernia.  A CT scan was subsequently performed which showed no evidence of hernia or other abnormalities.  He was noted to have increased vascular flow in the right inguinal canal.  He was noted to have right testicular tenderness on exam.  Urinalysis was unremarkable.  He was treated with tramadol and Levaquin for epididymitis.  He states his pain has significantly improved.   PMH: Past Medical History:  Diagnosis Date  . Chicken pox   . Hyperlipidemia   . Hypertension   . Nocturnal leg cramps 12/04/2017  . Obesity   . Obstructive sleep apnea     Surgical History: Past Surgical History:  Procedure Laterality Date  . WISDOM TOOTH EXTRACTION      Home Medications:  Allergies as of 12/31/2018      Reactions   Amoxicillin    Itching/crawling sensation       Medication List       Accurate as of December 31, 2018  4:50 PM. Always use your most recent med list.        amLODipine 5 MG tablet Commonly known as:  Norvasc Take 1 tablet (5 mg total) by mouth daily.   ibuprofen 200 MG tablet Commonly known as:  ADVIL,MOTRIN Take 200 mg by mouth. As needed    levofloxacin 500 MG tablet Commonly known as:  Levaquin Take 1 tablet (500 mg total) by mouth daily for 10 days.   montelukast 10 MG tablet Commonly known as:  SINGULAIR Take 1 tablet (10 mg total) by mouth at bedtime.   multivitamin Tabs tablet Take 1 tablet by mouth daily.   traMADol 50 MG tablet Commonly known as:  Ultram Take 1 tablet (50 mg total) by mouth every 6 (six) hours as needed.   VITAMIN D3 PO Take by mouth. Takes 5000 units daily       Allergies:  Allergies  Allergen Reactions  . Amoxicillin     Itching/crawling sensation      Family History: Family History  Problem Relation Age of Onset  . Hypertension Mother   . Other Mother        fatty liver  . Diabetes Father   . Cancer Maternal Grandmother        colon, liver  . Hypertension Maternal Grandmother   . Cancer Maternal Grandfather        prostate  . Hypertension Maternal Grandfather   . Anemia Paternal Grandmother   . Cancer Paternal Grandfather        leukemia  . Hypertension Maternal Aunt     Social History:  reports that he has never smoked. He has quit using smokeless tobacco.  His smokeless tobacco use included chew. He reports current alcohol use of  about 2.0 standard drinks of alcohol per week. He reports that he does not use drugs.  ROS: UROLOGY Frequent Urination?: Yes Hard to postpone urination?: No Burning/pain with urination?: No Get up at night to urinate?: Yes Leakage of urine?: No Urine stream starts and stops?: No Trouble starting stream?: No Do you have to strain to urinate?: No Blood in urine?: No Urinary tract infection?: No Sexually transmitted disease?: No Injury to kidneys or bladder?: No Painful intercourse?: No Weak stream?: No Erection problems?: No Penile pain?: No  Gastrointestinal Nausea?: No Vomiting?: No Indigestion/heartburn?: No Diarrhea?: No Constipation?: No  Constitutional Fever: No Night sweats?: No Weight loss?: No Fatigue?: Yes   Skin Skin rash/lesions?: No Itching?: No  Eyes Blurred vision?: No Double vision?: No  Ears/Nose/Throat Sore throat?: No Sinus problems?: No  Hematologic/Lymphatic Swollen glands?: No Easy bruising?: No  Cardiovascular Leg swelling?: No Chest pain?: No  Respiratory Cough?: No Shortness of breath?: No  Endocrine Excessive thirst?: No  Musculoskeletal Back pain?: No Joint pain?: Yes  Neurological Headaches?: No Dizziness?: No  Psychologic Depression?: No Anxiety?: No  Physical Exam: BP 128/82 (BP Location: Left Arm, Patient Position: Sitting, Cuff Size: Large)   Pulse 87   Ht 5\' 7"  (1.702 m)   Wt 285 lb 4.8 oz (129.4 kg)   BMI 44.68 kg/m   Constitutional:  Alert and oriented, No acute distress. HEENT:  AT, moist mucus membranes.  Trachea midline, no masses. Cardiovascular: No clubbing, cyanosis, or edema. Respiratory: Normal respiratory effort, no increased work of breathing. GI: Abdomen is soft, nontender, nondistended, no abdominal masses GU: No CVA tenderness.  Penis without lesions, testes descended bilaterally without mass.  Minimal tenderness right posterior testis.  Epididymis palpably normal bilaterally Lymph: No cervical or inguinal lymphadenopathy. Skin: No rashes, bruises or suspicious lesions. Neurologic: Grossly intact, no focal deficits, moving all 4 extremities. Psychiatric: Normal mood and affect.  Laboratory Data: Lab Results  Component Value Date   WBC 11.9 (H) 12/24/2018   HGB 15.6 12/24/2018   HCT 44.4 12/24/2018   MCV 88.1 12/24/2018   PLT 288 12/24/2018    Lab Results  Component Value Date   CREATININE 0.89 12/26/2018    Lab Results  Component Value Date   TESTOSTERONE 249 (L) 12/01/2018    Lab Results  Component Value Date   HGBA1C 5.3 08/13/2016    Urinalysis    Component Value Date/Time   APPEARANCEUR Clear 12/26/2018 1526   GLUCOSEU Negative 12/26/2018 1526   BILIRUBINUR Negative 12/26/2018 1526    PROTEINUR Negative 12/26/2018 1526   UROBILINOGEN 0.2 12/24/2018 1755   NITRITE Negative 12/26/2018 1526   LEUKOCYTESUR Negative 12/26/2018 1526    Lab Results  Component Value Date   LABMICR Comment 12/26/2018     Assessment & Plan:   38 year old male with hypogonadism and recent history of right hemiscrotal pain.  Will check a repeat a.m. testosterone level along with an LH and prolactin.  If low he is interested in TRT.  Delivery options were discussed including topicals, intramuscular injection.  Recent approval of subcutaneous preparations and oral testosterone was also discussed. The off label use of Clomid was discussed provided his LH is not elevated.  Potential side effects of testosterone replacement were discussed including stimulation of benign prostatic growth with lower urinary tract symptoms; erythrocytosis; edema; gynecomastia; worsening sleep apnea; venous thromboembolism; testicular atrophy and infertility. Recent studies suggesting an increased incidence of heart attack and stroke in patients taking testosterone was discussed. He was informed there is  conflicting evidence regarding the impact of testosterone therapy on cardiovascular risk. The theoretical risk of growth stimulation of an undetected prostate cancer was also discussed.  He was informed that current evidence does not provide any definitive answers regarding the risks of testosterone therapy on prostate cancer and cardiovascular disease. The need for periodic monitoring of his testosterone level, PSA, hematocrit and DRE was discussed.  The etiology of his scrotal pain is unknown at this time.  His urinalysis was normal and the epididymis was normal sonographically.  His pain has significantly improved.   Abbie Sons, [place] [address], [place], [place] [address] [phone]

## 2019-01-01 ENCOUNTER — Encounter: Payer: Self-pay | Admitting: Urology

## 2019-01-06 ENCOUNTER — Other Ambulatory Visit: Payer: Self-pay

## 2019-01-06 ENCOUNTER — Ambulatory Visit (INDEPENDENT_AMBULATORY_CARE_PROVIDER_SITE_OTHER): Payer: 59 | Admitting: Internal Medicine

## 2019-01-06 ENCOUNTER — Encounter: Payer: Self-pay | Admitting: Internal Medicine

## 2019-01-06 VITALS — BP 140/82 | HR 95 | Temp 98.7°F | Ht 67.0 in | Wt 288.6 lb

## 2019-01-06 DIAGNOSIS — R7989 Other specified abnormal findings of blood chemistry: Secondary | ICD-10-CM | POA: Diagnosis not present

## 2019-01-06 DIAGNOSIS — R1031 Right lower quadrant pain: Secondary | ICD-10-CM | POA: Diagnosis not present

## 2019-01-06 DIAGNOSIS — I10 Essential (primary) hypertension: Secondary | ICD-10-CM | POA: Diagnosis not present

## 2019-01-06 NOTE — Progress Notes (Signed)
Pre visit review using our clinic review tool, if applicable. No additional management support is needed unless otherwise documented below in the visit note. 

## 2019-01-06 NOTE — Progress Notes (Signed)
Chief Complaint  Patient presents with  . Follow-up   F/u  1. Right groin strain [place] + hernia CT negative but likely inflammation after lifting a 150 lbs recliner into the dumpster 2. Reviewed labs glucose 139 he was not fasting  3. Hypogonadism f/u labs tomorrow and f/u urology if low T will start therapy  4. 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  HENT: Negative for hearing loss.   Eyes: Negative for blurred vision.  Respiratory: Negative for shortness of breath.   Cardiovascular: Negative for chest pain.  Gastrointestinal: Negative for abdominal pain.  Musculoskeletal: Negative for falls.  Skin: Negative for rash.  Neurological: Negative for headaches.  Psychiatric/Behavioral: Negative for depression.   Past Medical History:  Diagnosis Date  . Chicken pox   . Hyperlipidemia   . Hypertension   . Nocturnal leg cramps 12/04/2017  . Obesity   . Obstructive sleep apnea    Past Surgical History:  Procedure Laterality Date  . WISDOM TOOTH EXTRACTION     Family History  Problem Relation Age of Onset  . Hypertension Mother   . Other Mother        fatty liver  . Diabetes Father   . Cancer Maternal Grandmother        colon, liver  . Hypertension Maternal Grandmother   . Cancer Maternal Grandfather        prostate  . Hypertension Maternal Grandfather   . Anemia Paternal Grandmother   . Cancer Paternal Grandfather        leukemia  . Hypertension Maternal Aunt    Social History   Socioeconomic History  . Marital status: Married    Spouse name: Not on file  . Number of children: 1  . Years of education: 12+  . Highest education level: Not on file  Occupational  History  . Occupation: [employer]  Social Needs  . Financial resource strain: Not on file  . Food insecurity:    Worry: Not on file    Inability: Not on file  . Transportation needs:    Medical: Not on file    Non-medical: Not on file  Tobacco Use  . Smoking status: Never Smoker  . Smokeless tobacco: Former Systems developer    Types: Chew  Substance and Sexual Activity  . Alcohol use: Yes    Alcohol/week: 2.0 standard drinks    Types: 2 Cans of beer per week    Comment: on occasion  . Drug use: No  . Sexual activity: Yes  Lifestyle  . Physical activity:    Days per week: Not on file    Minutes per session: Not on file  . Stress: Not on file  Relationships  . Social connections:    Talks on phone: Not on file    Gets together: Not on file    Attends religious service: Not on file    Active member of club or organization: Not on file    Attends meetings of clubs or organizations: Not on file    Relationship status: Not on file  . Intimate partner violence:    Fear of current or ex partner:  Not on file    Emotionally abused: Not on file    Physically abused: Not on file    Forced sexual activity: Not on file  Other Topics Concern  . Not on file  Social History Narrative   Lives with wife   Caffeine use: 1 cup coffee or 1 sugar free redbull   Works as Conservation officer, historic buildings x 16 years    Married    Left-handed   Current Meds  Medication Sig  . amLODipine (NORVASC) 5 MG tablet Take 1 tablet (5 mg total) by mouth daily.  . Cholecalciferol (VITAMIN D3 PO) Take by mouth. Takes 5000 units daily  . ibuprofen (ADVIL,MOTRIN) 200 MG tablet Take 200 mg by mouth. As needed  . montelukast (SINGULAIR) 10 MG tablet Take 1 tablet (10 mg total) by mouth at bedtime.  . multivitamin (ONE-A-DAY MEN'S) TABS tablet Take 1 tablet by mouth daily.  . traMADol (ULTRAM) 50 MG tablet Take 1 tablet (50 mg total) by mouth every 6 (six) hours as needed.   Allergies  Allergen Reactions  .  Amoxicillin     Itching/crawling sensation     Recent Results (from the past 2160 hour(s))  CK Total (and CKMB)     Status: None   Collection Time: 12/01/18  8:18 AM  Result Value Ref Range   Total CK 149 24 - 204 U/L   CK-MB Index 2.6 0.0 - 10.4 ng/mL  Testosterone     Status: Abnormal   Collection Time: 12/01/18  8:18 AM  Result Value Ref Range   Testosterone 249 (L) 264 - 916 ng/dL    Comment: Adult male reference interval is based on a population of healthy nonobese males (BMI <30) between 61 and 73 years old. Kitzmiller, Reynolds [phone]. PMID: [id number].   Vitamin D (25 hydroxy)     Status: None   Collection Time: 12/01/18  8:18 AM  Result Value Ref Range   Vit D, 25-Hydroxy 38.8 30.0 - 100.0 ng/mL    Comment: Vitamin D deficiency has been defined by the [place] practice guideline as a level of serum 25-OH vitamin D less than 20 ng/mL (1,2). The Endocrine Society went on to further define vitamin D insufficiency as a level between 21 and 29 ng/mL (2). 1. IOM (Institute of Medicine). 2010. Dietary reference    intakes for calcium and D. [place]: The    [employer]. 2. Holick MF, Binkley [place], Bischoff-Ferrari HA, et al.    Evaluation, treatment, and prevention of vitamin D    deficiency: an Endocrine Society clinical practice    guideline. JCEM. 2011 Jul; 96(7):1911-30.   TSH     Status: None   Collection Time: 12/01/18  8:18 AM  Result Value Ref Range   TSH 2.090 0.450 - 4.500 uIU/mL  Lipid panel     Status: Abnormal   Collection Time: 12/01/18  8:18 AM  Result Value Ref Range   Cholesterol, Total 193 100 - 199 mg/dL   Triglycerides 98 0 - 149 mg/dL   HDL 36 (L) >39 mg/dL   VLDL Cholesterol Cal 20 5 - 40 mg/dL   LDL Calculated 137 (H) 0 - 99 mg/dL   Chol/HDL Ratio 5.4 (H) 0.0 - 5.0 ratio    Comment:  T. Chol/HDL Ratio                                              Men  Women                               1/2 Avg.Risk  3.4    3.3                                   Avg.Risk  5.0    4.4                                2X Avg.Risk  9.6    7.1                                3X Avg.Risk 23.4   11.0   CBC with Differential/Platelet     Status: None   Collection Time: 12/01/18  8:18 AM  Result Value Ref Range   WBC 8.4 3.4 - 10.8 x10E3/uL   RBC 4.91 4.14 - 5.80 x10E6/uL   Hemoglobin 15.4 13.0 - 17.7 g/dL   Hematocrit 43.3 37.5 - 51.0 %   MCV 88 79 - 97 fL   MCH 31.4 26.6 - 33.0 pg   MCHC 35.6 31.5 - 35.7 g/dL   RDW 12.2 11.6 - 15.4 %   Platelets 299 150 - 450 x10E3/uL   Neutrophils 63 Not Estab. %   Lymphs 26 Not Estab. %   Monocytes 8 Not Estab. %   Eos 2 Not Estab. %   Basos 1 Not Estab. %   Neutrophils Absolute 5.4 1.4 - 7.0 x10E3/uL   Lymphocytes Absolute 2.2 0.7 - 3.1 x10E3/uL   Monocytes Absolute 0.7 0.1 - 0.9 x10E3/uL   EOS (ABSOLUTE) 0.1 0.0 - 0.4 x10E3/uL   Basophils Absolute 0.1 0.0 - 0.2 x10E3/uL   Immature Granulocytes 0 Not Estab. %   Immature Grans (Abs) 0.0 0.0 - 0.1 x10E3/uL  Comprehensive metabolic panel     Status: Abnormal   Collection Time: 12/01/18  8:18 AM  Result Value Ref Range   Glucose 68 65 - 99 mg/dL   BUN 14 6 - 20 mg/dL   Creatinine, Ser 0.79 0.76 - 1.27 mg/dL   GFR calc non Af Amer 113 >59 mL/min/1.73   GFR calc Af Amer 131 >59 mL/min/1.73   BUN/Creatinine Ratio 18 9 - 20   Sodium 139 134 - 144 mmol/L   Potassium 5.9 (H) 3.5 - 5.2 mmol/L   Chloride 102 96 - 106 mmol/L   CO2 21 20 - 29 mmol/L   Calcium 8.8 8.7 - 10.2 mg/dL   Total Protein 6.4 6.0 - 8.5 g/dL   Albumin 4.1 4.0 - 5.0 g/dL    Comment:               **Please note reference interval change**   Globulin, Total 2.3 1.5 - 4.5 g/dL   Albumin/Globulin Ratio 1.8 1.2 - 2.2   Bilirubin Total 0.5 0.0 - 1.2 mg/dL   Alkaline Phosphatase 100 39 - 117 IU/L   AST 16 0 - 40 IU/L  ALT 23 0 - 44 IU/L  POCT Urinalysis Dipstick (Automated)     Status: None    Collection Time: 12/24/18  5:55 PM  Result Value Ref Range   Color, UA yellow    Clarity, UA clear    Glucose, UA Negative Negative   Bilirubin, UA negative    Ketones, UA negative    Spec Grav, UA 1.020 1.010 - 1.025   Blood, UA negative    pH, UA 6.0 5.0 - 8.0   Protein, UA Negative Negative   Urobilinogen, UA 0.2 0.2 or 1.0 E.U./dL   Nitrite, UA negative    Leukocytes, UA Negative Negative  CBC with Differential     Status: Abnormal   Collection Time: 12/24/18 10:24 PM  Result Value Ref Range   WBC 11.9 (H) 4.0 - 10.5 K/uL   RBC 5.04 4.22 - 5.81 MIL/uL   Hemoglobin 15.6 13.0 - 17.0 g/dL   HCT 44.4 39.0 - 52.0 %   MCV 88.1 80.0 - 100.0 fL   MCH 31.0 26.0 - 34.0 pg   MCHC 35.1 30.0 - 36.0 g/dL   RDW 11.9 11.5 - 15.5 %   Platelets 288 150 - 400 K/uL   nRBC 0.0 0.0 - 0.2 %   Neutrophils Relative % 74 %   Neutro Abs 8.7 (H) 1.7 - 7.7 K/uL   Lymphocytes Relative 20 %   Lymphs Abs 2.4 0.7 - 4.0 K/uL   Monocytes Relative 5 %   Monocytes Absolute 0.6 0.1 - 1.0 K/uL   Eosinophils Relative 1 %   Eosinophils Absolute 0.1 0.0 - 0.5 K/uL   Basophils Relative 0 %   Basophils Absolute 0.1 0.0 - 0.1 K/uL   Immature Granulocytes 0 %   Abs Immature Granulocytes 0.04 0.00 - 0.07 K/uL    Comment: Performed at [facility], [address] [place], [place] [address]  Comprehensive metabolic panel     Status: Abnormal   Collection Time: 12/24/18 10:24 PM  Result Value Ref Range   Sodium 140 135 - 145 mmol/L   Potassium 4.2 3.5 - 5.1 mmol/L   Chloride 106 98 - 111 mmol/L   CO2 25 22 - 32 mmol/L   Glucose, Bld 101 (H) 70 - 99 mg/dL   BUN 13 6 - 20 mg/dL   Creatinine, Ser 0.92 0.61 - 1.24 mg/dL   Calcium 9.1 8.9 - 10.3 mg/dL   Total Protein 7.1 6.5 - 8.1 g/dL   Albumin 3.9 3.5 - 5.0 g/dL   AST 21 15 - 41 U/L   ALT 31 0 - 44 U/L   Alkaline Phosphatase 76 38 - 126 U/L   Total Bilirubin 0.7 0.3 - 1.2 mg/dL   GFR calc non Af Amer >60 >60 mL/min   GFR calc Af Amer >60 >60  mL/min   Anion gap 9 5 - 15    Comment: Performed at [facility], [address] [place], [place] [address]  Basic Metabolic Panel (BMET)     Status: Abnormal   Collection Time: 12/26/18  3:26 PM  Result Value Ref Range   Glucose 139 (H) 65 - 99 mg/dL   BUN 14 6 - 20 mg/dL   Creatinine, Ser 0.89 0.76 - 1.27 mg/dL   GFR calc non Af Amer 108 >59 mL/min/1.73   GFR calc Af Amer 125 >59 mL/min/1.73   BUN/Creatinine Ratio 16 9 - 20   Sodium 140 134 - 144 mmol/L   Potassium 3.9 3.5 - 5.2 mmol/L  Chloride 101 96 - 106 mmol/L   CO2 21 20 - 29 mmol/L   Calcium 9.2 8.7 - 10.2 mg/dL  Urinalysis, Routine w reflex microscopic     Status: None   Collection Time: 12/26/18  3:26 PM  Result Value Ref Range   Specific Gravity, UA 1.019 1.005 - 1.030   pH, UA 6.0 5.0 - 7.5   Color, UA Yellow Yellow   Appearance Ur Clear Clear   Leukocytes, UA Negative Negative   Protein, UA Negative Negative/Trace   Glucose, UA Negative Negative   Ketones, UA Negative Negative   RBC, UA Negative Negative   Bilirubin, UA Negative Negative   Urobilinogen, Ur 0.2 0.2 - 1.0 mg/dL   Nitrite, UA Negative Negative   Microscopic Examination Comment     Comment: Microscopic not indicated and not performed.  Specimen status report     Status: None   Collection Time: 12/26/18  3:26 PM  Result Value Ref Range   specimen status report Comment     Comment: Verbal Order See below: Comment: Please provide requested information and fax to [phone] or [phone]. The [place] Code of [employer] requires a written and signed request be forwarded to a laboratory following a verbal order of a laboratory test.  Please assist [place] to meet this requirement and to complete our records. Date:______________________________ ICD-9/10 Diagnosis Code(s):___________________________________________ Physician or Authorized Designee:_____________________________________                                                Please Print Physician or Authorized Designee Signature: ______________________________________________________________________ Your Passenger transport manager Of The Test(s) Listed Additional Test(s) Requested Comment: Test(s) added per [employer] at account 12-29-2018 Logged by Orlinda Blalock Test# [id number] Hepatitis B Surf Ab Quant Diagnosis Codes Provided    E87.5   Hepatitis B surface antibody,quantitative     Status: None   Collection Time: 12/26/18  3:26 PM  Result Value Ref Range   Hepatitis B Surf Ab Quant 61.4 Immunity>9.9 mIU/mL    Comment:   Status of Immunity                     Anti-HBs Level   ------------------                     -------------- Inconsistent with Immunity                   0.0 - 9.9 Consistent with Immunity                          >9.9   Specimen status report     Status: None   Collection Time: 12/26/18  3:26 PM  Result Value Ref Range   specimen status report Comment     Comment: Written Authorization Written Authorization Written Authorization Received. Authorization received from [place] 12-30-2018 Logged by Anselmo Pickler    Objective  Body mass index is 45.2 kg/m. Wt Readings from Last 3 Encounters:  01/06/19 288 lb 9.6 oz (130.9 kg)  12/31/18 285 lb 4.8 oz (129.4 kg)  12/24/18 270 lb (122.5 kg)   Temp Readings from Last 3 Encounters:  01/06/19 98.7 F (37.1 C) (Oral)  12/25/18 98.1 F (36.7 C) (Oral)  12/24/18 98.8 F (37.1 C) (Oral)   BP  Readings from Last 3 Encounters:  01/06/19 140/82  12/31/18 128/82  12/25/18 129/78   Pulse Readings from Last 3 Encounters:  01/06/19 95  12/31/18 87  12/25/18 89    Physical Exam Vitals signs and nursing note reviewed.  Constitutional:      Appearance: Normal appearance. He is well-developed and well-groomed. He is obese.  HENT:     Head: Normocephalic and atraumatic.     Nose: Nose normal.     Mouth/Throat:     Mouth: Mucous membranes are moist.      Pharynx: Oropharynx is clear.  Eyes:     Conjunctiva/sclera: Conjunctivae normal.     Pupils: Pupils are equal, round, and reactive to light.  Cardiovascular:     Rate and Rhythm: Normal rate and regular rhythm.     Heart sounds: Normal heart sounds. No murmur.  Pulmonary:     Effort: Pulmonary effort is normal.     Breath sounds: Normal breath sounds.  Skin:    General: Skin is warm and dry.  Neurological:     General: No focal deficit present.     Mental Status: He is alert and oriented to person, place, and time. Mental status is at baseline.     Gait: Gait normal.  Psychiatric:        Attention and Perception: Attention and perception normal.        Mood and Affect: Mood and affect normal.        Speech: Speech normal.        Behavior: Behavior normal. Behavior is cooperative.        Thought Content: Thought content normal.        Cognition and Memory: Cognition and memory normal.        Judgment: Judgment normal.     Assessment   1. Right groin likely MSK strain CT negative for hernia likely inflammation  2. Hypogonadism low T 3. Obesity bmi 45.2  4. HTN  5. HM Plan   1. Pain is better ntd  2. F/u urology tomorrow with labs  Pt considering T therapy if T is low again  3.  Consider adipex p Already eating healthy and working out  4. Cont norvasc 5 mg qd for now  5. Had flu shot utd  Tdaputd  rec MMR has not had yet  Hep B immune   PSA pending with urology   Consider repeat sleep study has been 5 years   Provider: Dr. Olivia Mackie McLean-Scocuzza-Internal Medicine

## 2019-01-06 NOTE — Patient Instructions (Addendum)
Results for Caleb [name], Caleb [name] (MRN [id number]) as of 01/06/2019 11:43  Ref. Range 04/30/2018 08:46  MUMPS ABS, IGG Latest Ref Range: Immune >10.9 AU/mL 228.0  Rubella Latest Ref Range: Immune >0.99 index 1.87  RUBEOLA AB, IGG Latest Ref Range: Immune >29.9 AU/mL <25.0 (L)    Testosterone Replacement Therapy  Testosterone replacement therapy (TRT) is used to treat men who have a low testosterone level (hypogonadism). Testosterone is a male hormone that is produced in the testicles. It is responsible for typically male characteristics and for maintaining a man's sex drive and the ability to get an erection. Testosterone also supports bone and muscle health. TRT can be a gel, liquid, or patch that you put on your skin. It can also be in the form of a tablet or an injection. In some cases, your health care provider may insert long-acting pellets under your skin. In most men, the level of testosterone starts to decline gradually after age 46. Low testosterone can also be caused by certain medical conditions, medicines, and obesity. Your health care provider can diagnose hypogonadism with at least two blood tests that are done early in the morning. Low testosterone may not need to be treated. TRT is usually a choice that you make with your health care provider. Your health care provider may recommend TRT if you have low testosterone that is causing symptoms, such as:  Low sex drive.  Erection problems.  Breast enlargement.  Loss of body hair.  Weak muscles or bones.  Shrinking testicles.  Increased body fat.  Low energy.  Hot flashes.  Depression.  Decreased work Systems analyst. TRT is a lifetime treatment. If you stop treatment, your testosterone will drop, and your symptoms may return. What are the risks? Testosterone replacement therapy may have side effects, including:  Lower sperm count.  Skin irritation at the application or injection site.  Mouth irritation if you take an oral  tablet.  Acne.  Swelling of your legs or feet.  Tender breasts.  Dizziness.  Sleep disturbance.  Mood swings.  Possible increased risk of stroke or heart attack. Testosterone replacement therapy may also increase your risk for prostate cancer or male breast cancer. You should not use TRT if you have either of those conditions. Your health care provider also may not recommend TRT if:  You are suspected of having prostate cancer.  You want to father a child.  You have a high number of red blood cells.  You have untreated sleep apnea.  You have a very large prostate. Supplies needed:  Your health care provider will prescribe the testosterone gel, solution, or medicine that you need. If your health care provider teaches you to do self-injections at home, you will also need: ? Your medicine vial. ? Disposable needles and syringes. ? Alcohol swabs. ? A needle disposal container. ? Adhesive bandages. How to use testosterone replacement therapy Your health care provider will help you find the TRT option that will work best for you based on your preference, the side effects, and the cost. You may:  Rub testosterone gel on your upper arm or shoulder every day after a shower. This is the most common type of TRT. Do not let women or children come in contact with the gel.  Apply a testosterone solution under your arms once each day.  Place a testosterone patch on your skin once each day.  Dissolve a testosterone tablet in your mouth twice each day.  Have a testosterone pellet inserted under your skin by  your health care provider. This will be replaced every 3-6 months.  Use testosterone nasal spray three times each day.  Get testosterone injections. For some types of testosterone, your health care provider will give you this injection. With other types of testosterone, you may be taught to give injections to yourself. The frequency of injections may vary based on the type of  testosterone that you receive. Follow these instructions at home:  Take over-the-counter and prescription medicines only as told by your health care provider.  Lose weight if you are overweight. Ask your health care provider to help you start a healthy diet and exercise program to reach and maintain a healthy weight.  Work with your health care provider to treat other medical conditions that may lower your testosterone. These include obesity, high blood pressure, high cholesterol, diabetes, liver disease, kidney disease, and sleep apnea.  Keep all follow-up visits as told by your health care provider. This is important. General recommendations  Discuss all risks and benefits with your health care provider before starting therapy.  Work with your health care provider to check your prostate health and do blood testing before you start therapy.  Do not use any testosterone replacement therapies that are not prescribed by your health care provider or not approved for use in the [place]  Do not use TRT for bodybuilding or to improve sexual performance. TRT should be used only to treat symptoms of low testosterone.  Return for all repeat prostate checks and blood tests during therapy, as told by your health care provider. Where to find more information Learn more about testosterone replacement therapy from:  [place]: www.urologyhealth.org/urologic-conditions/low-testosterone-(hypogonadism)  Endocrine Society: www.hormone.org/diseases-and-conditions/mens-health/hypogonadism Contact a health care provider if:  You have side effects from your testosterone replacement therapy.  You continue to have symptoms of low testosterone during treatment.  You develop new symptoms during treatment. Summary  Testosterone replacement therapy is only for men who have low testosterone as determined by blood testing and who have symptoms of low testosterone.  Testosterone replacement  therapy should be prescribed only by a health care provider and should be used under the supervision of a health care provider.  You may not be able to take testosterone if you have certain medical conditions, including prostate cancer, male breast cancer, or heart disease.  Testosterone replacement therapy may have side effects and may make some medical conditions worse.  Talk with your health care provider about all the risks and benefits before you start therapy. This information is not intended to replace advice given to you by your health care provider. Make sure you discuss any questions you have with your health care provider. Document Released: 06/28/2016 Document Revised: 06/28/2016 Document Reviewed: 06/28/2016 Elsevier Interactive Patient Education  2019 [employer].

## 2019-01-07 ENCOUNTER — Other Ambulatory Visit: Payer: 59

## 2019-01-07 DIAGNOSIS — R739 Hyperglycemia, unspecified: Secondary | ICD-10-CM

## 2019-01-07 DIAGNOSIS — K760 Fatty (change of) liver, not elsewhere classified: Secondary | ICD-10-CM

## 2019-01-07 DIAGNOSIS — Z1159 Encounter for screening for other viral diseases: Secondary | ICD-10-CM

## 2019-01-07 DIAGNOSIS — Z0184 Encounter for antibody response examination: Secondary | ICD-10-CM

## 2019-01-07 DIAGNOSIS — E291 Testicular hypofunction: Secondary | ICD-10-CM

## 2019-01-08 LAB — HEPATITIS B SURFACE ANTIBODY, QUANTITATIVE: Hepatitis B Surf Ab Quant: 55.7 m[IU]/mL (ref >9.9)

## 2019-01-08 LAB — HEMOGLOBIN A1C
Est. average glucose Bld gHb Est-mCnc: 111 mg/dL
Hgb A1c MFr Bld: 5.5 % (ref 4.8–5.6)

## 2019-01-08 LAB — PSA: Prostate Specific Ag, Serum: 0.8 ng/mL (ref 0.0–4.0)

## 2019-01-08 LAB — PROLACTIN: Prolactin: 10.3 ng/mL (ref 4.0–15.2)

## 2019-01-08 LAB — TESTOSTERONE: Testosterone: 330 ng/dL (ref 264–916)

## 2019-01-13 ENCOUNTER — Telehealth: Payer: Self-pay | Admitting: Family Medicine

## 2019-01-13 NOTE — Telephone Encounter (Signed)
Clomid is going to be his best option if his LH is not elevated.

## 2019-01-13 NOTE — Telephone Encounter (Signed)
-----   Message from Abbie Sons, MD sent at 01/11/2019 12:19 PM EDT ----- Testosterone level was low normal.  Is not a candidate for TRT.  LH level is pending.  It does not look like this was drawn.  See if they will run an [place] off of the specimen.  Patient to be contacted with the [facility] results.

## 2019-01-13 NOTE — Telephone Encounter (Signed)
Patient notified and will come in for [facility] at a later date. The blood was discarded from [facility]. He is wanting to know if there is anything else he can do to help bring his level up with out using testosterone replacement?

## 2019-01-13 NOTE — Telephone Encounter (Signed)
Attempted to call patient, no answer 

## 2019-01-15 NOTE — Telephone Encounter (Signed)
Sent pt mychart message

## 2019-03-03 ENCOUNTER — Ambulatory Visit: Payer: 59 | Admitting: Internal Medicine

## 2019-03-06 NOTE — Telephone Encounter (Signed)
error 

## 2019-05-06 ENCOUNTER — Encounter: Payer: Self-pay | Admitting: Internal Medicine

## 2019-05-06 ENCOUNTER — Encounter (INDEPENDENT_AMBULATORY_CARE_PROVIDER_SITE_OTHER): Payer: Self-pay

## 2019-05-06 ENCOUNTER — Telehealth (INDEPENDENT_AMBULATORY_CARE_PROVIDER_SITE_OTHER): Payer: 59 | Admitting: Internal Medicine

## 2019-05-06 DIAGNOSIS — R054 Cough syncope: Secondary | ICD-10-CM

## 2019-05-06 DIAGNOSIS — J40 Bronchitis, not specified as acute or chronic: Secondary | ICD-10-CM

## 2019-05-06 DIAGNOSIS — R05 Cough: Secondary | ICD-10-CM | POA: Diagnosis not present

## 2019-05-06 DIAGNOSIS — J9801 Acute bronchospasm: Secondary | ICD-10-CM | POA: Diagnosis not present

## 2019-05-06 DIAGNOSIS — Z20828 Contact with and (suspected) exposure to other viral communicable diseases: Secondary | ICD-10-CM

## 2019-05-06 DIAGNOSIS — R55 Syncope and collapse: Secondary | ICD-10-CM

## 2019-05-06 DIAGNOSIS — Z20822 Contact with and (suspected) exposure to covid-19: Secondary | ICD-10-CM

## 2019-05-06 MED ORDER — HYDROCOD POLST-CPM POLST ER 10-8 MG/5ML PO SUER: 5.0000 mL | Freq: Every evening | ORAL | 0 refills | Status: DC | PRN

## 2019-05-06 MED ORDER — ALBUTEROL SULFATE HFA 108 (90 BASE) MCG/ACT IN AERS: 1.0000 | INHALATION_SPRAY | RESPIRATORY_TRACT | 2 refills | Status: DC | PRN

## 2019-05-06 MED ORDER — AZITHROMYCIN 250 MG PO TABS: ORAL_TABLET | ORAL | 0 refills | Status: DC

## 2019-05-06 NOTE — Progress Notes (Signed)
Virtual Visit via Video Note  I connected with Caleb [name]   on 05/06/19 at  1:30 PM EDT by a video enabled telemedicine application and verified that I am speaking with the correct person using two identifiers.  Location patient: home Location provider:work  Persons participating in the virtual visit: patient, provider, pts wife   I discussed the limitations of evaluation and management by telemedicine and the availability of in person appointments. The patient expressed understanding and agreed to proceed.   HPI: 1. 2 weeks ago he had fever 100 to 101 x 1.5 days cough with mucous dark to grey, yellow green to clear. Today he coughed so hard that he passed out for a few seconds. He has been going to work and his step mom was recently dx'ed with pneumonia. He had had wheezing, resolved fever, cough with phelgm, denies sob but having MSK pain with cough and denies pleuritic chest pain.  He did do COVID 19 testing 1 month ago which was negative. He has tried robitussin w/o relief.  He left work 05/05/2019 and they will not let him return until he feels better   ROS: See pertinent positives and negatives per HPI.  Past Medical History:  Diagnosis Date  . Chicken pox   . Hyperlipidemia   . Hypertension   . Nocturnal leg cramps 12/04/2017  . Obesity   . Obstructive sleep apnea     Past Surgical History:  Procedure Laterality Date  . WISDOM TOOTH EXTRACTION      Family History  Problem Relation Age of Onset  . Hypertension Mother   . Other Mother        fatty liver  . Diabetes Father   . Cancer Maternal Grandmother        colon, liver  . Hypertension Maternal Grandmother   . Cancer Maternal Grandfather        prostate  . Hypertension Maternal Grandfather   . Anemia Paternal Grandmother   . Cancer Paternal Grandfather        leukemia  . Hypertension Maternal Aunt     SOCIAL HX: married    Current Outpatient Medications:  .  amLODipine (NORVASC) 5 MG tablet, Take 1 tablet  (5 mg total) by mouth daily., Disp: 90 tablet, Rfl: 3 .  Cholecalciferol (VITAMIN D3 PO), Take by mouth. Takes 5000 units daily, Disp: , Rfl:  .  ibuprofen (ADVIL,MOTRIN) 200 MG tablet, Take 200 mg by mouth. As needed, Disp: , Rfl:  .  montelukast (SINGULAIR) 10 MG tablet, Take 1 tablet (10 mg total) by mouth at bedtime., Disp: 90 tablet, Rfl: 3 .  multivitamin (ONE-A-DAY MEN'S) TABS tablet, Take 1 tablet by mouth daily., Disp: , Rfl:  .  traMADol (ULTRAM) 50 MG tablet, Take 1 tablet (50 mg total) by mouth every 6 (six) hours as needed., Disp: 15 tablet, Rfl: 0 .  albuterol (VENTOLIN HFA) 108 (90 Base) MCG/ACT inhaler, Inhale 1-2 puffs into the lungs every 4 (four) hours as needed for wheezing or shortness of breath., Disp: 18 g, Rfl: 2 .  azithromycin (ZITHROMAX) 250 MG tablet, 2 pills day 1 and 1 pill day 2-5, Disp: 6 tablet, Rfl: 0 .  chlorpheniramine-HYDROcodone (TUSSIONEX PENNKINETIC ER) 10-8 MG/5ML SUER, Take 5 mLs by mouth at bedtime as needed for cough., Disp: 140 mL, Rfl: 0  EXAM:  VITALS per patient if applicable:  GENERAL: alert, oriented, appears well and in no acute distress  HEENT: atraumatic, conjunttiva clear, no obvious abnormalities on inspection of external  nose and ears  NECK: normal movements of the head and neck  LUNGS: on inspection no signs of respiratory distress, breathing rate appears normal, no obvious gross SOB, gasping or wheezing  CV: no obvious cyanosis  MS: moves all visible extremities without noticeable abnormality  PSYCH/NEURO: pleasant and cooperative, no obvious depression or anxiety, speech and thought processing grossly intact  ASSESSMENT AND PLAN:  Discussed the following assessment and plan:  Bronchitis - Plan: azithromycin (ZITHROMAX) 250 MG tablet, chlorpheniramine-HYDROcodone (TUSSIONEX PENNKINETIC ER) 10-8 MG/5ML SURE qhs prn, albuterol (VENTOLIN HFA) 108 (90 Base) MCG/ACT inhaler, Novel Coronavirus, NAA ([facility]) -call back Friday and if  not better will do CXR  -note for work   Bronchospasm - Plan: azithromycin (ZITHROMAX) 250 MG tablet, chlorpheniramine-HYDROcodone (TUSSIONEX PENNKINETIC ER) 10-8 MG/5ML SUER, albuterol (VENTOLIN HFA) 108 (90 Base) MCG/ACT inhaler, Novel Coronavirus, NAA ([facility])  Exposure to Covid-19 Virus - Plan: Novel Coronavirus, NAA ([facility])  Cough syncope  disc'ed with pt if passes out best if goes to hospital for evaluation   HM Had flu shotutd  Tdaputd  rec MMR has not had yet  Hep B immune   PSA normal 01/07/19 0.8   Will need repeat CBC and lipid in future  Consider repeat sleep study has been 5 years   I discussed the assessment and treatment plan with the patient. The patient was provided an opportunity to ask questions and all were answered. The patient agreed with the plan and demonstrated an understanding of the instructions.   The patient was advised to call back or seek an in-person evaluation if the symptoms worsen or if the condition fails to improve as anticipated.  Time spent 25 minutes  Delorise Jackson, MD

## 2019-05-08 ENCOUNTER — Encounter: Payer: Self-pay | Admitting: Internal Medicine

## 2019-05-08 ENCOUNTER — Encounter (INDEPENDENT_AMBULATORY_CARE_PROVIDER_SITE_OTHER): Payer: Self-pay

## 2019-05-09 ENCOUNTER — Encounter (INDEPENDENT_AMBULATORY_CARE_PROVIDER_SITE_OTHER): Payer: Self-pay

## 2019-05-09 LAB — NOVEL CORONAVIRUS, NAA: SARS-CoV-2, NAA: NOT DETECTED

## 2019-05-10 ENCOUNTER — Encounter (INDEPENDENT_AMBULATORY_CARE_PROVIDER_SITE_OTHER): Payer: Self-pay

## 2019-05-11 ENCOUNTER — Encounter (INDEPENDENT_AMBULATORY_CARE_PROVIDER_SITE_OTHER): Payer: Self-pay

## 2019-05-11 ENCOUNTER — Other Ambulatory Visit: Payer: Self-pay | Admitting: Internal Medicine

## 2019-05-11 DIAGNOSIS — R0781 Pleurodynia: Secondary | ICD-10-CM

## 2019-05-11 DIAGNOSIS — R05 Cough: Secondary | ICD-10-CM

## 2019-05-11 DIAGNOSIS — R059 Cough, unspecified: Secondary | ICD-10-CM

## 2019-05-12 ENCOUNTER — Encounter (INDEPENDENT_AMBULATORY_CARE_PROVIDER_SITE_OTHER): Payer: Self-pay

## 2019-05-12 ENCOUNTER — Other Ambulatory Visit: Payer: Self-pay

## 2019-05-12 ENCOUNTER — Ambulatory Visit
Admission: RE | Admit: 2019-05-12 | Discharge: 2019-05-12 | Disposition: A | Discharge status: Home / Self Care | Payer: 59 | Attending: Internal Medicine | Admitting: Internal Medicine

## 2019-05-12 ENCOUNTER — Ambulatory Visit
Admission: RE | Admit: 2019-05-12 | Discharge: 2019-05-12 | Disposition: A | Discharge status: Home / Self Care | Payer: 59 | Source: Ambulatory Visit | Attending: Internal Medicine | Admitting: Internal Medicine

## 2019-05-12 DIAGNOSIS — R0781 Pleurodynia: Secondary | ICD-10-CM | POA: Insufficient documentation

## 2019-05-12 DIAGNOSIS — R05 Cough: Secondary | ICD-10-CM

## 2019-05-12 DIAGNOSIS — R059 Cough, unspecified: Secondary | ICD-10-CM

## 2019-05-13 ENCOUNTER — Encounter (INDEPENDENT_AMBULATORY_CARE_PROVIDER_SITE_OTHER): Payer: Self-pay

## 2019-05-14 ENCOUNTER — Encounter (INDEPENDENT_AMBULATORY_CARE_PROVIDER_SITE_OTHER): Payer: Self-pay

## 2019-05-14 ENCOUNTER — Telehealth: Payer: Self-pay

## 2019-05-14 NOTE — Telephone Encounter (Signed)
[place] Call - patient reports cough - worse a little bit - lingering results from Bronchitis;completed antibiotic - feels like he needs another round; Chest xray - Negative for pneumonia. Advised patient to contact PCP for Rx for antibiotic. No further questions/concerns.

## 2019-05-15 ENCOUNTER — Other Ambulatory Visit: Payer: Self-pay | Admitting: Internal Medicine

## 2019-05-15 DIAGNOSIS — R05 Cough: Secondary | ICD-10-CM

## 2019-05-15 DIAGNOSIS — R059 Cough, unspecified: Secondary | ICD-10-CM

## 2019-05-15 MED ORDER — BENZONATATE 200 MG PO CAPS: 200.0000 mg | ORAL_CAPSULE | Freq: Two times a day (BID) | ORAL | 0 refills | Status: DC | PRN

## 2019-05-16 ENCOUNTER — Encounter (INDEPENDENT_AMBULATORY_CARE_PROVIDER_SITE_OTHER): Payer: Self-pay

## 2019-05-17 ENCOUNTER — Encounter (INDEPENDENT_AMBULATORY_CARE_PROVIDER_SITE_OTHER): Payer: Self-pay

## 2019-05-19 ENCOUNTER — Encounter (INDEPENDENT_AMBULATORY_CARE_PROVIDER_SITE_OTHER): Payer: Self-pay

## 2019-06-15 ENCOUNTER — Encounter: Payer: Self-pay | Admitting: Internal Medicine

## 2019-06-16 ENCOUNTER — Other Ambulatory Visit: Payer: Self-pay | Admitting: Internal Medicine

## 2019-06-16 DIAGNOSIS — R053 Chronic cough: Secondary | ICD-10-CM

## 2019-06-16 DIAGNOSIS — R05 Cough: Secondary | ICD-10-CM

## 2019-06-16 DIAGNOSIS — G4733 Obstructive sleep apnea (adult) (pediatric): Secondary | ICD-10-CM

## 2019-06-19 ENCOUNTER — Telehealth: Payer: Self-pay

## 2019-06-19 NOTE — Progress Notes (Signed)
[facility] Consultation      Assessment and Plan:  Dyspnea on exertion. Acute bronchitis, persistent asthma. - Symptoms and signs of asthma.  Symptoms appear to be improved with albuterol inhaler. -Will start Brio inhaler once daily, rinse mouth after use.  Use albuterol as needed.  Obstructive sleep apnea. - Currently on CPAP at 7. - We will get a download from his CPAP.  We will renew his CPAP supplies. - Patient was interested in discussing other CPAP therapies, we discussed other modalities including UPPP, as well as inspire device, he was not interested in these at this time.  He is a very narrow pharynx with a Mallampati of 4, therefore I am doubtful that a mandibular advancement device would be helpful for him.  Flu vaccine administered today 06/22/2019.  Orders Placed This Encounter  Procedures   For home use only DME continuous positive airway pressure (CPAP)   Flu Vaccine QUAD 36+ mos IM   Meds ordered this encounter  Medications   fluticasone furoate-vilanterol (BREO ELLIPTA) 200-25 MCG/INH AEPB    Sig: Inhale 1 puff into the lungs daily. Rinse mouth after use.    Dispense:  1 each    Refill:  5      Date: 06/22/2019  MRN# [id number] CORDERA BORSELLINO November 29, 1980   Renold Don is a 38 y.o. old male seen in consultation for chief complaint of:    Chief Complaint  Patient presents with   pulmonary consult    per Dr. Aundra Dubin- hx of bronchitis. round of zpak with some improvement in sx. c/o sob with exertion & prod cough with white mucus.    HPI:  MAYSEN KREIKEMEIER is a 38 y.o. old male he notes that he had a bout of bronchitis last year, and feels that he has never fully recovered.  He feels congested much of the time, and his dyspnea is noticeable with exercise.  The symptoms are worse with high humidity hot days, wearing a mask.  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  He has 1 dog at home, not in bed.   He has  an albuterol inhaler and has used it and notes that it helps with his breathing, however it made him cough stuff up.  He had another bout of bronchitis about 4 months ago, he got z pack and felt better. He has continued to cough and notes that when he wears a mask at work it makes his congestion worse.  He does martial arts 3 days per week, his stamina is less that it was. He gained about 50 lbs in past year after losing about 100 pounds previous to that.  He has never been diagnosed with asthma, or other respiratory problems.  He has had reflux in the past but no symptoms now, used to take nexium 10 yrs ago.   He has occasional runny nose.  He has OSA, diagnosed in about 2013 at [facility], does not follow with sleep md. He is using cpap at night, he is a Agricultural engineer.  Review of all sleep study 2013 shows that the patient had moderate obstructive sleep apnea treated well with a CPAP of 7.  He has no residual daytime sleepiness symptoms.  Patient is not snoring.  **Chest x-ray 05/12/2019>> imaging personally reviewed, possible slight left lower lobe atelectasis, noted some chronic bronchitis, lungs are otherwise unremarkable. **CBC 12/01/2018>> AEC 100.  **Split-night sleep study 05/02/2012.>>  AHI 15.7.  Did well at all CPAP pressures, final pressure was 7.  Recommended CPAP titration and weight loss.  PMHX:   Past Medical History:  Diagnosis Date   Chicken pox    Hyperlipidemia    Hypertension    Nocturnal leg cramps 12/04/2017   Obesity    Obstructive sleep apnea    Surgical Hx:  Past Surgical History:  Procedure Laterality Date   WISDOM TOOTH EXTRACTION     Family Hx:  Family History  Problem Relation Age of Onset   Hypertension Mother    Other Mother        fatty liver   Diabetes Father    Cancer Maternal Grandmother        colon, liver   Hypertension Maternal Grandmother    Cancer Maternal Grandfather        prostate   Hypertension Maternal  Grandfather    Anemia Paternal Grandmother    Cancer Paternal Grandfather        leukemia   Hypertension Maternal Aunt    Social Hx:   Social History   Tobacco Use   Smoking status: Never Smoker   Smokeless tobacco: Former Systems developer    Types: Chew  Substance Use Topics   Alcohol use: Yes    Alcohol/week: 2.0 standard drinks    Types: 2 Cans of beer per week    Comment: on occasion   Drug use: No   Medication:    Current Outpatient Medications:    albuterol (VENTOLIN HFA) 108 (90 Base) MCG/ACT inhaler, Inhale 1-2 puffs into the lungs every 4 (four) hours as needed for wheezing or shortness of breath., Disp: 18 g, Rfl: 2   amLODipine (NORVASC) 5 MG tablet, Take 1 tablet (5 mg total) by mouth daily., Disp: 90 tablet, Rfl: 3   benzonatate (TESSALON) 200 MG capsule, Take 1 capsule (200 mg total) by mouth 2 (two) times daily as needed for cough., Disp: 40 capsule, Rfl: 0   Cholecalciferol (VITAMIN D3 PO), Take by mouth. Takes 5000 units daily, Disp: , Rfl:    ibuprofen (ADVIL,MOTRIN) 200 MG tablet, Take 200 mg by mouth. As needed, Disp: , Rfl:    montelukast (SINGULAIR) 10 MG tablet, Take 1 tablet (10 mg total) by mouth at bedtime., Disp: 90 tablet, Rfl: 3   multivitamin (ONE-A-DAY MEN'S) TABS tablet, Take 1 tablet by mouth daily., Disp: , Rfl:    chlorpheniramine-HYDROcodone (TUSSIONEX PENNKINETIC ER) 10-8 MG/5ML SUER, Take 5 mLs by mouth at bedtime as needed for cough., Disp: 140 mL, Rfl: 0   Allergies:  Amoxicillin  Review of Systems: Gen:  Denies  fever, sweats, chills HEENT: Denies blurred vision, double vision. bleeds, sore throat Cvc:  No dizziness, chest pain. Resp:   Denies cough or sputum production, shortness of breath Gi: Denies swallowing difficulty, stomach pain. Gu:  Denies bladder incontinence, burning urine Ext:   No Joint pain, stiffness. Skin: No skin rash,  hives  Endoc:  No polyuria, polydipsia. Psych: No depression, insomnia. Other:  All other  systems were reviewed with the patient and were negative other that what is mentioned in the HPI.   Physical Examination:   VS: BP 128/78 (BP Location: Left Arm, Cuff Size: Normal)    Pulse 77    Temp 97.9 F (36.6 C) (Temporal)    Ht 5\' 7"  (1.702 m)    Wt 285 lb 6.4 oz (129.5 kg)    SpO2 96%    BMI 44.70 kg/m   General Appearance: No distress  Neuro:without  focal findings,  speech normal,  HEENT: PERRLA, EOM intact.  Mallampati 4. Pulmonary: normal breath sounds, No wheezing.  CardiovascularNormal S1,S2.  No m/r/g.   Abdomen: Benign, Soft, non-tender. Renal:  No costovertebral tenderness  GU:  No performed at this time. Endoc: No evident thyromegaly, no signs of acromegaly. Skin:   warm, no rashes, no ecchymosis  Extremities: normal, no cyanosis, clubbing.  Other findings:    LABORATORY PANEL:   CBC No results for input(s): WBC, HGB, HCT, PLT in the last 168 hours. ------------------------------------------------------------------------------------------------------------------  Chemistries  No results for input(s): NA, K, CL, CO2, GLUCOSE, BUN, CREATININE, CALCIUM, MG, AST, ALT, ALKPHOS, BILITOT in the last 168 hours.  Invalid input(s): GFRCGP ------------------------------------------------------------------------------------------------------------------  Cardiac Enzymes No results for input(s): TROPONINI in the last 168 hours. ------------------------------------------------------------  RADIOLOGY:  No results found.     Thank  you for the consultation and for allowing [facility] to assist in the care of your patient. Our recommendations are noted above.  Please contact us if we can be of further service.   Marda Stalker, M.D., F.C.C.P.  Board Certified in Internal Medicine, Pulmonary Medicine, [place], and Sleep Medicine.   Pulmonary and Critical Care Office Number: [phone]   06/22/2019

## 2019-06-19 NOTE — Telephone Encounter (Signed)
Pt is scheduled for consult with LG on 06/22/2019. Pt will need to be switched to a sleep physician, as LG does not see sleep.  Left message for pt to relay this information.

## 2019-06-19 NOTE — Telephone Encounter (Signed)
Pt has been switched to see DR on 06/22/2019 at 8:45. Pt aware and voiced his understanding. Nothing further is needed.

## 2019-06-22 ENCOUNTER — Encounter: Payer: Self-pay | Admitting: Internal Medicine

## 2019-06-22 ENCOUNTER — Ambulatory Visit (INDEPENDENT_AMBULATORY_CARE_PROVIDER_SITE_OTHER): Payer: 59 | Admitting: Internal Medicine

## 2019-06-22 ENCOUNTER — Other Ambulatory Visit: Payer: Self-pay

## 2019-06-22 ENCOUNTER — Institutional Professional Consult (permissible substitution): Payer: 59 | Admitting: Pulmonary Disease

## 2019-06-22 VITALS — BP 128/78 | HR 77 | Temp 97.9°F | Ht 67.0 in | Wt 285.4 lb

## 2019-06-22 DIAGNOSIS — Z23 Encounter for immunization: Secondary | ICD-10-CM | POA: Diagnosis not present

## 2019-06-22 DIAGNOSIS — G4733 Obstructive sleep apnea (adult) (pediatric): Secondary | ICD-10-CM

## 2019-06-22 DIAGNOSIS — J4540 Moderate persistent asthma, uncomplicated: Secondary | ICD-10-CM | POA: Diagnosis not present

## 2019-06-22 MED ORDER — BREO ELLIPTA 200-25 MCG/INH IN AEPB: 1.0000 | INHALATION_SPRAY | Freq: Every day | RESPIRATORY_TRACT | 5 refills | Status: DC

## 2019-06-22 NOTE — Patient Instructions (Addendum)
Start Breo inhaler once daily, rinse mouth after use.  Continue albuterol inhaler as needed. You can take 2 puffs before exercise.  Continue cpap every night. Will give you a prescription for cpap supplies.   Will see if we can get a download from your CPAP.

## 2019-08-10 ENCOUNTER — Other Ambulatory Visit: Payer: Self-pay

## 2019-08-12 ENCOUNTER — Other Ambulatory Visit: Payer: Self-pay

## 2019-08-12 ENCOUNTER — Ambulatory Visit (INDEPENDENT_AMBULATORY_CARE_PROVIDER_SITE_OTHER): Payer: 59 | Admitting: Internal Medicine

## 2019-08-12 ENCOUNTER — Encounter: Payer: Self-pay | Admitting: Internal Medicine

## 2019-08-12 VITALS — BP 126/90 | HR 85 | Temp 97.7°F | Ht 67.0 in | Wt 284.6 lb

## 2019-08-12 DIAGNOSIS — G4733 Obstructive sleep apnea (adult) (pediatric): Secondary | ICD-10-CM

## 2019-08-12 DIAGNOSIS — E785 Hyperlipidemia, unspecified: Secondary | ICD-10-CM

## 2019-08-12 DIAGNOSIS — Z1389 Encounter for screening for other disorder: Secondary | ICD-10-CM

## 2019-08-12 DIAGNOSIS — H6122 Impacted cerumen, left ear: Secondary | ICD-10-CM

## 2019-08-12 DIAGNOSIS — M62838 Other muscle spasm: Secondary | ICD-10-CM

## 2019-08-12 DIAGNOSIS — Z1329 Encounter for screening for other suspected endocrine disorder: Secondary | ICD-10-CM

## 2019-08-12 DIAGNOSIS — E559 Vitamin D deficiency, unspecified: Secondary | ICD-10-CM

## 2019-08-12 DIAGNOSIS — M5417 Radiculopathy, lumbosacral region: Secondary | ICD-10-CM

## 2019-08-12 DIAGNOSIS — H938X2 Other specified disorders of left ear: Secondary | ICD-10-CM | POA: Diagnosis not present

## 2019-08-12 DIAGNOSIS — G629 Polyneuropathy, unspecified: Secondary | ICD-10-CM | POA: Insufficient documentation

## 2019-08-12 DIAGNOSIS — I10 Essential (primary) hypertension: Secondary | ICD-10-CM

## 2019-08-12 DIAGNOSIS — E538 Deficiency of other specified B group vitamins: Secondary | ICD-10-CM

## 2019-08-12 MED ORDER — AMLODIPINE BESYLATE 2.5 MG PO TABS: 2.5000 mg | ORAL_TABLET | Freq: Two times a day (BID) | ORAL | 3 refills | Status: DC

## 2019-08-12 MED ORDER — CYCLOBENZAPRINE HCL 5 MG PO TABS: 5.0000 mg | ORAL_TABLET | Freq: Every evening | ORAL | 0 refills | Status: DC | PRN

## 2019-08-12 MED ORDER — PHENTERMINE HCL 37.5 MG PO TABS: 18.2500 mg | ORAL_TABLET | Freq: Every day | ORAL | 0 refills | Status: DC

## 2019-08-12 NOTE — Progress Notes (Signed)
Chief Complaint  Patient presents with  . Follow-up   3 month f/u  1. C/o left ear feels full tried to lavage today unsuccessful attempt by cma. He does have allergies and takes generic antihistamine otc   2. C/o b/l inner thighs to lower legs tingling burning and EMG +L5 radiculopathy and rec MRI low back which pt c/w cost. He reports was drinking more until 6 months ago cut out etoh and helped with neuropathy sx's but they have returned though no etoh  During these episodes at night also c/o legs esp left leg gets locked in place and he wants to try a muscle relaxer  He denies back pain CT ab/pelvis negative for etiology in lower back but neurology rec MRI lumbar which he has never done 2/2 cost   3. Morbid obesity bmi 44.57 he is working out 3-4 x per week > 1 hr each session at the gym and unable to lose weight and changed diet wants to try appetite suppressant  4. HTN dbp 90 today on norvasc 5 mg qd reports makes him tired  5. osa on cpap  6. pulm c/w asthma and breo did help with respiratory sx's and uses rescue inhaler 30 minutes before exercise   Review of Systems  Constitutional: Negative for weight loss.  HENT: Negative for hearing loss.        +left ear fullness   Eyes: Negative for blurred vision.  Respiratory: Negative for shortness of breath.   Cardiovascular: Negative for chest pain.  Gastrointestinal: Negative for abdominal pain.  Musculoskeletal: Negative for back pain.       +muscle spasm legs   Skin: Negative for rash.  Neurological: Positive for sensory change. Negative for headaches.  Psychiatric/Behavioral: Negative for depression.   Past Medical History:  Diagnosis Date  . Chicken pox   . Hyperlipidemia   . Hypertension   . Nocturnal leg cramps 12/04/2017  . Obesity   . Obstructive sleep apnea    Past Surgical History:  Procedure Laterality Date  . WISDOM TOOTH EXTRACTION     Family History  Problem Relation Age of Onset  . Hypertension Mother   .  Other Mother        fatty liver  . Diabetes Father   . Cancer Maternal Grandmother        colon, liver  . Hypertension Maternal Grandmother   . Other Maternal Grandmother        brain anerusym, mini stroke  . Cancer Maternal Grandfather        prostate in 43s  . Hypertension Maternal Grandfather   . Anemia Paternal Grandmother   . Cancer Paternal Grandfather        leukemia  . Hypertension Maternal Aunt    Social History   Socioeconomic History  . Marital status: Married    Spouse name: Not on file  . Number of children: 1  . Years of education: 12+  . Highest education level: Not on file  Occupational History  . Occupation: [employer]  Social Needs  . Financial resource strain: Not on file  . Food insecurity    Worry: Not on file    Inability: Not on file  . Transportation needs    Medical: Not on file    Non-medical: Not on file  Tobacco Use  . Smoking status: Never Smoker  . Smokeless tobacco: Former Systems developer    Types: Chew  Substance and Sexual Activity  . Alcohol use: Yes    Alcohol/week:  2.0 standard drinks    Types: 2 Cans of beer per week    Comment: on occasion  . Drug use: No  . Sexual activity: Yes  Lifestyle  . Physical activity    Days per week: Not on file    Minutes per session: Not on file  . Stress: Not on file  Relationships  . Social Herbalist on phone: Not on file    Gets together: Not on file    Attends religious service: Not on file    Active member of club or organization: Not on file    Attends meetings of clubs or organizations: Not on file    Relationship status: Not on file  . Intimate partner violence    Fear of current or ex partner: Not on file    Emotionally abused: Not on file    Physically abused: Not on file    Forced sexual activity: Not on file  Other Topics Concern  . Not on file  Social History Narrative   Lives with wife   Caffeine use: 1 cup coffee or 1 sugar free redbull   Works as Human resources officer x 16 years    Married    Left-handed   Current Meds  Medication Sig  . albuterol (VENTOLIN HFA) 108 (90 Base) MCG/ACT inhaler Inhale 1-2 puffs into the lungs every 4 (four) hours as needed for wheezing or shortness of breath.  Marland Kitchen amLODipine (NORVASC) 2.5 MG tablet Take 1 tablet (2.5 mg total) by mouth 2 (two) times daily.  . Cholecalciferol (VITAMIN D3 PO) Take by mouth. Takes 5000 units daily  . fluticasone furoate-vilanterol (BREO ELLIPTA) 200-25 MCG/INH AEPB Inhale 1 puff into the lungs daily. Rinse mouth after use.  . ibuprofen (ADVIL,MOTRIN) 200 MG tablet Take 200 mg by mouth. As needed  . montelukast (SINGULAIR) 10 MG tablet Take 1 tablet (10 mg total) by mouth at bedtime.  . multivitamin (ONE-A-DAY MEN'S) TABS tablet Take 1 tablet by mouth daily.  . [DISCONTINUED] amLODipine (NORVASC) 5 MG tablet Take 1 tablet (5 mg total) by mouth daily.  . [DISCONTINUED] benzonatate (TESSALON) 200 MG capsule Take 1 capsule (200 mg total) by mouth 2 (two) times daily as needed for cough.   Allergies  Allergen Reactions  . Lisinopril     Cough   . Amoxicillin     Itching/crawling sensation     No results found for this or any previous visit (from the past 2160 hour(s)). Objective  Body mass index is 44.57 kg/m. Wt Readings from Last 3 Encounters:  08/12/19 284 lb 9.6 oz (129.1 kg)  06/22/19 285 lb 6.4 oz (129.5 kg)  01/06/19 288 lb 9.6 oz (130.9 kg)   Temp Readings from Last 3 Encounters:  08/12/19 97.7 F (36.5 C) (Oral)  06/22/19 97.9 F (36.6 C) (Temporal)  01/06/19 98.7 F (37.1 C) (Oral)   BP Readings from Last 3 Encounters:  08/12/19 126/90  06/22/19 128/78  01/06/19 140/82   Pulse Readings from Last 3 Encounters:  08/12/19 85  06/22/19 77  01/06/19 95    Physical Exam Vitals signs and nursing note reviewed.  Constitutional:      Appearance: Normal appearance. He is well-developed and well-groomed. He is morbidly obese.     Comments: +mask on    HENT:      Head: Normocephalic and atraumatic.     Comments: +left ear with wax impaction  Eyes:     Conjunctiva/sclera: Conjunctivae normal.  Pupils: Pupils are equal, round, and reactive to light.  Cardiovascular:     Rate and Rhythm: Normal rate and regular rhythm.     Heart sounds: Normal heart sounds. No murmur.  Pulmonary:     Effort: Pulmonary effort is normal.     Breath sounds: Normal breath sounds.  Abdominal:     General: Abdomen is flat. Bowel sounds are normal.     Tenderness: There is no abdominal tenderness.  Musculoskeletal:     Lumbar back: He exhibits no tenderness.  Skin:    General: Skin is warm and dry.  Neurological:     General: No focal deficit present.     Mental Status: He is alert and oriented to person, place, and time. Mental status is at baseline.     Gait: Gait normal.  Psychiatric:        Attention and Perception: Attention and perception normal.        Mood and Affect: Mood and affect normal.        Speech: Speech normal.        Behavior: Behavior normal. Behavior is cooperative.        Thought Content: Thought content normal.        Cognition and Memory: Cognition and memory normal.        Judgment: Judgment normal.     Assessment  Plan  Essential hypertension - Plan: amLODipine (NORVASC) 2.5 MG tablet bid from 5 mg qd to see if helps with feeling tired with medication  If not consider add hctz 12.5 mg qam   Morbid obesity (HCC) - Plan: phentermine (ADIPEX-P) 37.5 MG tablet f/u in 2 months was on qysymia in the past Also disc wt loss surgery with Dr. Kreg Shropshire in [place] pt want to try meds   Muscle spasm possibly with L5 radiculopathy and neuropathy noted on emg/ncs- Plan: cyclobenzaprine (FLEXERIL) 5 MG tablet Consider mri l spine in future   Ear fullness, left - Plan: Ambulatory referral to ENT Dr. Lianne Cure unable to ear lavage out wax today with cma pt tolerated procedure well  Excessive ear wax, left - Plan: Ambulatory referral to  ENT  Obstructive sleep apnea on cpap  Hyperlipidemia, unspecified hyperlipidemia type  Fasting labs 11/2019-12/2019   HM physical spring 2021 after labs with [facility] fasting  Had flu shotutd 06/22/2019  Tdaputd 04/30/18 rec MMR has not had yet Hep A/B immunewith h/o fatty liver   PSA normal 01/07/19 0.8    Provider: Dr. Olivia Mackie McLean-Scocuzza-[facility]

## 2019-08-12 NOTE — Patient Instructions (Addendum)
B12 123XX123 mcg daily  Folic acid 123XX123 mg daily  Alpha lipoic acid 600 mg 2x day  Blood pressure goal <130/<80  Monitor blood pressure   Fasting [facility] 12/06/19 or no later than mid 12/2019    Neuropathic Pain Neuropathic pain is pain caused by damage to the nerves that are responsible for certain sensations in your body (sensory nerves). The pain can be caused by:  Damage to the sensory nerves that send signals to your spinal cord and brain (peripheral nervous system).  Damage to the sensory nerves in your brain or spinal cord (central nervous system). Neuropathic pain can make you more sensitive to pain. Even a minor sensation can feel very painful. This is usually a long-term condition that can be difficult to treat. The type of pain differs from person to person. It may:  Start suddenly (acute), or it may develop slowly and last for a long time (chronic).  Come and go as damaged nerves heal, or it may stay at the same level for years.  Cause emotional distress, loss of sleep, and a lower quality of life. What are the causes? The most common cause of this condition is diabetes. Many other diseases and conditions can also cause neuropathic pain. Causes of neuropathic pain can be classified as:  Toxic. This is caused by medicines and chemicals. The most common cause of toxic neuropathic pain is damage from cancer treatments (chemotherapy).  Metabolic. This can be caused by: ? Diabetes. This is the most common disease that damages the nerves. ? Lack of vitamin B from long-term alcohol abuse.  Traumatic. Any injury that cuts, crushes, or stretches a nerve can cause damage and pain. A common example is feeling pain after losing an arm or leg (phantom limb pain).  Compression-related. If a sensory nerve gets trapped or compressed for a long period of time, the blood supply to the nerve can be cut off.  Vascular. Many blood vessel diseases can cause neuropathic pain by decreasing blood  supply and oxygen to nerves.  Autoimmune. This type of pain results from diseases in which the body's defense system (immune system) mistakenly attacks sensory nerves. Examples of autoimmune diseases that can cause neuropathic pain include lupus and multiple sclerosis.  Infectious. Many types of viral infections can damage sensory nerves and cause pain. Shingles infection is a common cause of this type of pain.  Inherited. Neuropathic pain can be a symptom of many diseases that are passed down through families (genetic). What increases the risk? You are more likely to develop this condition if:  You have diabetes.  You smoke.  You drink too much alcohol.  You are taking certain medicines, including medicines that kill cancer cells (chemotherapy) or that treat immune system disorders. What are the signs or symptoms? The main symptom is pain. Neuropathic pain is often described as:  Burning.  Shock-like.  Stinging.  Hot or cold.  Itching. How is this diagnosed? No single test can diagnose neuropathic pain. It is diagnosed based on:  Physical exam and your symptoms. Your health care provider will ask you about your pain. You may be asked to use a pain scale to describe how bad your pain is.  Tests. These may be done to see if you have a high sensitivity to pain and to help find the cause and location of any sensory nerve damage. They include: ? Nerve conduction studies to test how well nerve signals travel through your sensory nerves (electrodiagnostic testing). ? Stimulating your sensory  nerves through electrodes on your skin and measuring the response in your spinal cord and brain (somatosensory evoked potential).  Imaging studies, such as: ? X-rays. ? CT scan. ? MRI. How is this treated? Treatment for neuropathic pain may change over time. You may need to try different treatment options or a combination of treatments. Some options include:  Treating the underlying cause  of the neuropathy, such as diabetes, kidney disease, or vitamin deficiencies.  Stopping medicines that can cause neuropathy, such as chemotherapy.  Medicine to relieve pain. Medicines may include: ? Prescription or over-the-counter pain medicine. ? Anti-seizure medicine. ? Antidepressant medicines. ? Pain-relieving patches that are applied to painful areas of skin. ? A medicine to numb the area (local anesthetic), which can be injected as a nerve block.  Transcutaneous nerve stimulation. This uses electrical currents to block painful nerve signals. The treatment is painless.  Alternative treatments, such as: ? Acupuncture. ? Meditation. ? Massage. ? Physical therapy. ? Pain management programs. ? Counseling. Follow these instructions at home: Medicines   Take over-the-counter and prescription medicines only as told by your health care provider.  Do not drive or use heavy machinery while taking prescription pain medicine.  If you are taking prescription pain medicine, take actions to prevent or treat constipation. Your health care provider may recommend that you: ? Drink enough fluid to keep your urine pale yellow. ? Eat foods that are high in fiber, such as fresh fruits and vegetables, whole grains, and beans. ? Limit foods that are high in fat and processed sugars, such as fried or sweet foods. ? Take an over-the-counter or prescription medicine for constipation. Lifestyle   Have a good support system at home.  Consider joining a chronic pain support group.  Do not use any products that contain nicotine or tobacco, such as cigarettes and e-cigarettes. If you need help quitting, ask your health care provider.  Do not drink alcohol. General instructions  Learn as much as you can about your condition.  Work closely with all your health care providers to find the treatment plan that works best for you.  Ask your health care provider what activities are safe for you.   Keep all follow-up visits as told by your health care provider. This is important. Contact a health care provider if:  Your pain treatments are not working.  You are having side effects from your medicines.  You are struggling with tiredness (fatigue), mood changes, depression, or anxiety. Summary  Neuropathic pain is pain caused by damage to the nerves that are responsible for certain sensations in your body (sensory nerves).  Neuropathic pain may come and go as damaged nerves heal, or it may stay at the same level for years.  Neuropathic pain is usually a long-term condition that can be difficult to treat. Consider joining a chronic pain support group. This information is not intended to replace advice given to you by your health care provider. Make sure you discuss any questions you have with your health care provider. Document Released: 07/05/2004 Document Revised: 01/29/2019 Document Reviewed: 10/25/2017 Elsevier Patient Education  2020 [employer].

## 2019-08-31 ENCOUNTER — Other Ambulatory Visit: Payer: Self-pay | Admitting: Internal Medicine

## 2019-08-31 ENCOUNTER — Encounter: Payer: Self-pay | Admitting: Internal Medicine

## 2019-08-31 DIAGNOSIS — R200 Anesthesia of skin: Secondary | ICD-10-CM

## 2019-08-31 DIAGNOSIS — M62838 Other muscle spasm: Secondary | ICD-10-CM

## 2019-08-31 DIAGNOSIS — M5416 Radiculopathy, lumbar region: Secondary | ICD-10-CM

## 2019-09-07 ENCOUNTER — Telehealth: Payer: Self-pay | Admitting: Internal Medicine

## 2019-09-07 NOTE — Telephone Encounter (Signed)
Please change and inform pt

## 2019-09-07 NOTE — Telephone Encounter (Signed)
Patient stated he was scheduled to go to [facility] for his MRI but he would like to be sent to [facility] in [place] instead.

## 2019-09-08 NOTE — Telephone Encounter (Signed)
He was sent to [facility], but I will send it to [facility].

## 2019-09-15 ENCOUNTER — Telehealth: Payer: Self-pay | Admitting: Internal Medicine

## 2019-09-15 NOTE — Telephone Encounter (Signed)
MRI lumbar spine:   L2-3: Mild degenerative disc disease. Central canal is well-maintained. Neural foramina patent   L3-4: Mild degenerative disc disease. Central canal is well-maintained. Neural foramina are patent.    L4-5: Minimal disc desiccation. Central canal is well-maintained. Neural foramina are patent..   IMPRESSION:  Mild lumbar degenerative disc disease. No significant central canal stenosis or neuroforaminal narrowing. No disc herniations.  Does he want to see a specialist for this I.e pM&R for this ?  [place]

## 2019-09-16 ENCOUNTER — Encounter: Payer: Self-pay | Admitting: Internal Medicine

## 2019-09-16 NOTE — Telephone Encounter (Signed)
Patient was informed of results by chiropractor.  Patient understood and no questions, comments, or concerns at this time. Patients chiropractor, may have sent referral. He would like a list of who may you would recommend through [place].

## 2019-10-05 ENCOUNTER — Telehealth: Payer: Self-pay

## 2019-10-05 NOTE — Telephone Encounter (Signed)
Copied from [place] [id number]. Topic: General - Other >> Oct 05, 2019 12:04 PM Celene Kras wrote: Reason for CRM: Pt called stating he received a call regarding his appt on 10/15/2019. Please advise.

## 2019-10-15 ENCOUNTER — Ambulatory Visit: Payer: 59 | Admitting: Internal Medicine

## 2019-10-21 ENCOUNTER — Ambulatory Visit (INDEPENDENT_AMBULATORY_CARE_PROVIDER_SITE_OTHER): Payer: 59 | Admitting: Internal Medicine

## 2019-10-21 ENCOUNTER — Other Ambulatory Visit: Payer: Self-pay

## 2019-10-21 VITALS — BP 125/85 | Ht 67.0 in | Wt 265.0 lb

## 2019-10-21 DIAGNOSIS — G4733 Obstructive sleep apnea (adult) (pediatric): Secondary | ICD-10-CM

## 2019-10-21 DIAGNOSIS — R05 Cough: Secondary | ICD-10-CM | POA: Diagnosis not present

## 2019-10-21 DIAGNOSIS — I10 Essential (primary) hypertension: Secondary | ICD-10-CM | POA: Diagnosis not present

## 2019-10-21 DIAGNOSIS — R059 Cough, unspecified: Secondary | ICD-10-CM

## 2019-10-21 NOTE — Progress Notes (Signed)
Virtual Visit via Video Note  I connected with Caleb [name]  on 10/21/19 at  1:04 PM EST by a video enabled telemedicine application and verified that I am speaking with the correct person using two identifiers.  Location patient: home Location provider:work or home office Persons participating in the virtual visit: patient, provider  I discussed the limitations of evaluation and management by telemedicine and the availability of in person appointments. The patient expressed understanding and agreed to proceed.   HPI: 1. HTN doing better 120-130/70s on norvasc 2.5 mg bid and this dosage and freq he is less drowsy  2. Obesity with wt 265 losing on adipex 18.75 mg qd but causing h/a and lethargy though BP is not rising he wants consultation with bariatric surgery referred Dr. Kreg Shropshire  3. Ear cleaned out by ENT 1" or more wax out with helped with sinus congestion and given nasal spray dymista which is helping and rec use ear cleaner otc 2x per month 4. Reviewed MRI 09/15/19 mild DDD lumbar seeing a specialist with [facility] in [place] soon ? Name   ROS: See pertinent positives and negatives per HPI.  Past Medical History:  Diagnosis Date  . Chicken pox   . Hyperlipidemia   . Hypertension   . Nocturnal leg cramps 12/04/2017  . Obesity   . Obstructive sleep apnea     Past Surgical History:  Procedure Laterality Date  . WISDOM TOOTH EXTRACTION      Family History  Problem Relation Age of Onset  . Hypertension Mother   . Other Mother        fatty liver  . Diabetes Father   . Cancer Maternal Grandmother        colon, liver  . Hypertension Maternal Grandmother   . Other Maternal Grandmother        brain anerusym, mini stroke  . Cancer Maternal Grandfather        prostate in 11s  . Hypertension Maternal Grandfather   . Anemia Paternal Grandmother   . Cancer Paternal Grandfather        leukemia  . Hypertension Maternal Aunt     SOCIAL HX:  Married     Current Outpatient  Medications:  .  albuterol (VENTOLIN HFA) 108 (90 Base) MCG/ACT inhaler, Inhale 1-2 puffs into the lungs every 4 (four) hours as needed for wheezing or shortness of breath., Disp: 18 g, Rfl: 2 .  amLODipine (NORVASC) 2.5 MG tablet, Take 1 tablet (2.5 mg total) by mouth 2 (two) times daily., Disp: 180 tablet, Rfl: 3 .  Cholecalciferol (VITAMIN D3 PO), Take by mouth. Takes 5000 units daily, Disp: , Rfl:  .  cyclobenzaprine (FLEXERIL) 5 MG tablet, Take 1 tablet (5 mg total) by mouth at bedtime as needed for muscle spasms., Disp: 90 tablet, Rfl: 0 .  fluticasone furoate-vilanterol (BREO ELLIPTA) 200-25 MCG/INH AEPB, Inhale 1 puff into the lungs daily. Rinse mouth after use., Disp: 1 each, Rfl: 5 .  ibuprofen (ADVIL,MOTRIN) 200 MG tablet, Take 200 mg by mouth. As needed, Disp: , Rfl:  .  montelukast (SINGULAIR) 10 MG tablet, Take 1 tablet (10 mg total) by mouth at bedtime., Disp: 90 tablet, Rfl: 3 .  multivitamin (ONE-A-DAY MEN'S) TABS tablet, Take 1 tablet by mouth daily., Disp: , Rfl:  .  phentermine (ADIPEX-P) 37.5 MG tablet, Take 0.5-1 tablets (18.75-37.5 mg total) by mouth daily before breakfast., Disp: 60 tablet, Rfl: 0  EXAM:  VITALS per patient if applicable:  GENERAL: alert, oriented, appears  well and in no acute distress  HEENT: atraumatic, conjunttiva clear, no obvious abnormalities on inspection of external nose and ears  NECK: normal movements of the head and neck  LUNGS: on inspection no signs of respiratory distress, breathing rate appears normal, no obvious gross SOB, gasping or wheezing  CV: no obvious cyanosis  MS: moves all visible extremities without noticeable abnormality  PSYCH/NEURO: pleasant and cooperative, no obvious depression or anxiety, speech and thought processing grossly intact  ASSESSMENT AND PLAN:  Discussed the following assessment and plan:  Essential hypertension Cont norvasc 2.5 mg bid  Obstructive sleep apnea - Plan: Ambulatory referral to  Pulmonology Dr Halford Chessman also for cough with weather changes   Morbid obesity ([place]) - Plan: Amb Referral to Bariatric Surgery Dr. Jinny Sanders in the past and adipex 1/2 dose of 37.5 did not like the way adipex made him feel   HM physical spring/summer 2021 after labs with [facility] fasting due 12/28/19 pt has orders  Had flu shotutd 06/22/2019  Tdaputd 04/30/18 rec MMR has not had yet Hep A/B immunewith h/o fatty liver   PSAnormal 01/07/19 0.8  Skin no spots   -we discussed possible serious and likely etiologies, options for evaluation and workup, limitations of telemedicine visit vs in person visit, treatment, treatment risks and precautions. Pt prefers to treat via telemedicine empirically rather then risking or undertaking an in person visit at this moment. Patient agrees to seek prompt in person care if worsening, new symptoms arise, or if is not improving with treatment.   I discussed the assessment and treatment plan with the patient. The patient was provided an opportunity to ask questions and all were answered. The patient agreed with the plan and demonstrated an understanding of the instructions.   The patient was advised to call back or seek an in-person evaluation if the symptoms worsen or if the condition fails to improve as anticipated.  Time spent 15 minutes  Delorise Jackson, MD

## 2019-10-27 ENCOUNTER — Encounter: Payer: Self-pay | Admitting: Internal Medicine

## 2019-11-06 ENCOUNTER — Ambulatory Visit (INDEPENDENT_AMBULATORY_CARE_PROVIDER_SITE_OTHER): Payer: 59 | Admitting: Adult Health

## 2019-11-06 ENCOUNTER — Encounter: Payer: Self-pay | Admitting: Adult Health

## 2019-11-06 ENCOUNTER — Other Ambulatory Visit: Payer: Self-pay

## 2019-11-06 VITALS — BP 130/84 | HR 95 | Temp 97.2°F | Ht 67.0 in | Wt 285.4 lb

## 2019-11-06 DIAGNOSIS — J4530 Mild persistent asthma, uncomplicated: Secondary | ICD-10-CM

## 2019-11-06 DIAGNOSIS — G4733 Obstructive sleep apnea (adult) (pediatric): Secondary | ICD-10-CM

## 2019-11-06 DIAGNOSIS — J45909 Unspecified asthma, uncomplicated: Secondary | ICD-10-CM

## 2019-11-06 HISTORY — DX: Unspecified asthma, uncomplicated: J45.909

## 2019-11-06 NOTE — Assessment & Plan Note (Signed)
Active sleep apnea well-controlled on CPAP.  CPAP download requested  Plan  Patient Instructions  Continue on CPAP at bedtime CPAP download .  Wear for at least 6 hours each night Work on healthy weight Do not drive if sleepy Continue on Breo 1 puff daily, rinse after use Follow-up with Dr. Mortimer Fries in 6 months and As needed

## 2019-11-06 NOTE — Patient Instructions (Addendum)
Continue on CPAP at bedtime CPAP download .  Wear for at least 6 hours each night Work on healthy weight Do not drive if sleepy Continue on Breo 1 puff daily, rinse after use Follow-up with Dr. Mortimer Fries in 6 months and As needed

## 2019-11-06 NOTE — Progress Notes (Signed)
@Patient  ID: Caleb [name], male    DOB: 03-Mar-1981, 39 y.o.   MRN: [id number]  Chief Complaint  Patient presents with  . Follow-up    OSA     Referring provider: McLean-Scocuzza, Olivia Mackie *  HPI: 39 year old male never smoker followed for obstructive sleep apnea and asthma Pilot .   TEST/EVENTS :  Chest x-ray 05/12/2019>> imaging personally reviewed, possible slight left lower lobe atelectasis, noted some chronic bronchitis, lungs are otherwise unremarkable. **CBC 12/01/2018>> AEC 100.  **Split-night sleep study 05/02/2012.>>  AHI 15.7.  Did well at all CPAP pressures, final pressure was 7.  Recommended CPAP titration and weight loss.  11/06/2019 Follow up : OSA and Asthma  Patient presents for a 14-month follow-up.  Patient is followed for underlying asthma.  He was recently started on Breo.  Says breathing is doing better , feels BREO has really helped. Does feel change in the weather can cause breathing not to be as good.  Rare use albuterol . Very active , works full-time, goes to gym 3 x week . Does martial arts.   Patient is on nocturnal CPAP.  Patient says he is doing well on CPAP.  Denies any known issues.  Feels rested.  CPAP download was requested. Says he wears his CPAP each night for at least 6 hr each night . Helps him sleep better . And is More rested.   Allergies  Allergen Reactions  . Lisinopril     Cough   . Amoxicillin     Itching/crawling sensation      Immunization History  Administered Date(s) Administered  . Hepatitis B, adult 05/08/2018, 06/10/2018, 11/11/2018  . Influenza,inj,Quad PF,6+ Mos 07/12/2015, 07/30/2016, 06/22/2019  . Influenza-Unspecified 08/19/2017, 07/30/2018  . Td 09/23/2007  . Tdap 04/30/2018    Past Medical History:  Diagnosis Date  . Asthma 11/06/2019  . Chicken pox   . Hyperlipidemia   . Hypertension   . Nocturnal leg cramps 12/04/2017  . Obesity   . Obstructive sleep apnea     Tobacco History: Social History   Tobacco  Use  Smoking Status Never Smoker  Smokeless Tobacco Former Systems developer  . Types: Chew   Counseling given: Not Answered   Outpatient Medications Prior to Visit  Medication Sig Dispense Refill  . albuterol (VENTOLIN HFA) 108 (90 Base) MCG/ACT inhaler Inhale 1-2 puffs into the lungs every 4 (four) hours as needed for wheezing or shortness of breath. 18 g 2  . amLODipine (NORVASC) 2.5 MG tablet Take 1 tablet (2.5 mg total) by mouth 2 (two) times daily. 180 tablet 3  . Cholecalciferol (VITAMIN D3 PO) Take by mouth. Takes 5000 units daily    . fluticasone furoate-vilanterol (BREO ELLIPTA) 200-25 MCG/INH AEPB Inhale 1 puff into the lungs daily. Rinse mouth after use. 1 each 5  . ibuprofen (ADVIL,MOTRIN) 200 MG tablet Take 200 mg by mouth. As needed    . montelukast (SINGULAIR) 10 MG tablet Take 1 tablet (10 mg total) by mouth at bedtime. 90 tablet 3  . multivitamin (ONE-A-DAY MEN'S) TABS tablet Take 1 tablet by mouth daily.    . cyclobenzaprine (FLEXERIL) 5 MG tablet Take 1 tablet (5 mg total) by mouth at bedtime as needed for muscle spasms. (Patient not taking: Reported on 11/06/2019) 90 tablet 0  . phentermine (ADIPEX-P) 37.5 MG tablet Take 0.5-1 tablets (18.75-37.5 mg total) by mouth daily before breakfast. (Patient not taking: Reported on 11/06/2019) 60 tablet 0   No facility-administered medications prior to visit.  Review of Systems:   Constitutional:   No  weight loss, night sweats,  Fevers, chills, fatigue, or  lassitude.  HEENT:   No headaches,  Difficulty swallowing,  Tooth/dental problems, or  Sore throat,                No sneezing, itching, ear ache, nasal congestion, post nasal drip,   CV:  No chest pain,  Orthopnea, PND, swelling in lower extremities, anasarca, dizziness, palpitations, syncope.   GI  No heartburn, indigestion, abdominal pain, nausea, vomiting, diarrhea, change in bowel habits, loss of appetite, bloody stools.   Resp: No shortness of breath with exertion or at  rest.  No excess mucus, no productive cough,  No non-productive cough,  No coughing up of blood.  No change in color of mucus.  No wheezing.  No chest wall deformity  Skin: no rash or lesions.  GU: no dysuria, change in color of urine, no urgency or frequency.  No flank pain, no hematuria   MS:  No joint pain or swelling.  No decreased range of motion.  No back pain.    Physical Exam  BP 130/84 (BP Location: Left Arm, Patient Position: Sitting, Cuff Size: Large)   Pulse 95   Temp (!) 97.2 F (36.2 C) (Temporal)   Ht 5\' 7"  (1.702 m)   Wt 285 lb 6.4 oz (129.5 kg)   SpO2 96% Comment: on ra  BMI 44.70 kg/m    GEN: A/Ox3; pleasant , NAD, BMI 44    HEENT:  [place]/AT,   NOSE-clear, THROAT-clear, no lesions, no postnasal drip or exudate noted.   NECK:  Supple w/ fair ROM; no JVD; normal carotid impulses w/o bruits; no thyromegaly or nodules palpated; no lymphadenopathy.    RESP  Clear  P & A; w/o, wheezes/ rales/ or rhonchi. no accessory muscle use, no dullness to percussion  CARD:  RRR, no m/r/g, no peripheral edema, pulses intact, no cyanosis or clubbing.  GI:   Soft & nt; nml bowel sounds; no organomegaly or masses detected.   Musco: Warm bil, no deformities or joint swelling noted.   Neuro: alert, no focal deficits noted.    Skin: Warm, no lesions or rashes    Lab Results:   BMET   BNP No results found for: BNP  ProBNP No results found for: PROBNP  Imaging: No results found.    No flowsheet data found.  No results found for: NITRICOXIDE      Assessment & Plan:   Obstructive sleep apnea Active sleep apnea well-controlled on CPAP.  CPAP download requested  Plan  Patient Instructions  Continue on CPAP at bedtime CPAP download .  Wear for at least 6 hours each night Work on healthy weight Do not drive if sleepy Continue on Breo 1 puff daily, rinse after use Follow-up with Dr. Mortimer Fries in 6 months and As needed       Asthma Asthma improved control  on [place]  Patient Instructions  Continue on CPAP at bedtime CPAP download .  Wear for at least 6 hours each night Work on healthy weight Do not drive if sleepy Continue on Breo 1 puff daily, rinse after use Follow-up with Dr. Mortimer Fries in 6 months and As needed        Total patient care time 24 minutes  Kuper Rennels, NP 11/06/2019

## 2019-11-06 NOTE — Assessment & Plan Note (Signed)
Asthma improved control on Breo  Plan  Patient Instructions  Continue on CPAP at bedtime CPAP download .  Wear for at least 6 hours each night Work on healthy weight Do not drive if sleepy Continue on Breo 1 puff daily, rinse after use Follow-up with Dr. Mortimer Fries in 6 months and As needed

## 2019-11-09 DIAGNOSIS — G8929 Other chronic pain: Secondary | ICD-10-CM | POA: Insufficient documentation

## 2019-11-09 DIAGNOSIS — R0683 Snoring: Secondary | ICD-10-CM | POA: Insufficient documentation

## 2019-11-09 DIAGNOSIS — R5382 Chronic fatigue, unspecified: Secondary | ICD-10-CM | POA: Insufficient documentation

## 2019-11-23 ENCOUNTER — Encounter: Payer: Self-pay | Admitting: Internal Medicine

## 2019-12-07 ENCOUNTER — Other Ambulatory Visit: Payer: Self-pay | Admitting: Neurology

## 2019-12-07 DIAGNOSIS — R200 Anesthesia of skin: Secondary | ICD-10-CM

## 2019-12-16 ENCOUNTER — Other Ambulatory Visit: Payer: Self-pay | Admitting: Neurology

## 2019-12-16 ENCOUNTER — Ambulatory Visit
Admission: RE | Admit: 2019-12-16 | Discharge: 2019-12-16 | Disposition: A | Discharge status: Home / Self Care | Payer: 59 | Source: Ambulatory Visit | Attending: Neurology | Admitting: Neurology

## 2019-12-16 ENCOUNTER — Other Ambulatory Visit: Payer: Self-pay

## 2019-12-16 DIAGNOSIS — R200 Anesthesia of skin: Secondary | ICD-10-CM

## 2019-12-16 LAB — POCT I-STAT CREATININE: Creatinine, Ser: 1 mg/dL (ref 0.61–1.24)

## 2019-12-16 MED ORDER — GADOBUTROL 1 MMOL/ML IV SOLN: 10.0000 mL | Freq: Once | INTRAVENOUS | Status: DC | PRN

## 2019-12-18 ENCOUNTER — Other Ambulatory Visit: Payer: Self-pay | Admitting: Neurology

## 2019-12-18 DIAGNOSIS — R200 Anesthesia of skin: Secondary | ICD-10-CM

## 2019-12-19 LAB — URINALYSIS, ROUTINE W REFLEX MICROSCOPIC
Bilirubin, UA: NEGATIVE
Glucose, UA: NEGATIVE
Ketones, UA: NEGATIVE
Leukocytes,UA: NEGATIVE
Nitrite, UA: NEGATIVE
Protein,UA: NEGATIVE
RBC, UA: NEGATIVE
Specific Gravity, UA: 1.011 (ref 1.005–1.030)
Urobilinogen, Ur: 0.2 mg/dL (ref 0.2–1.0)
pH, UA: 6 (ref 5.0–7.5)

## 2019-12-19 LAB — LIPID PANEL
Chol/HDL Ratio: 4.7 ratio (ref 0.0–5.0)
Cholesterol, Total: 198 mg/dL (ref 100–199)
HDL: 42 mg/dL (ref >39)
LDL Chol Calc (NIH): 138 mg/dL — ABNORMAL HIGH (ref 0–99)
Triglycerides: 99 mg/dL (ref 0–149)
VLDL Cholesterol Cal: 18 mg/dL (ref 5–40)

## 2019-12-19 LAB — COMPREHENSIVE METABOLIC PANEL
ALT: 33 IU/L (ref 0–44)
AST: 25 IU/L (ref 0–40)
Albumin/Globulin Ratio: 1.7 (ref 1.2–2.2)
Albumin: 4.2 g/dL (ref 4.0–5.0)
Alkaline Phosphatase: 109 IU/L (ref 39–117)
BUN/Creatinine Ratio: 11 (ref 9–20)
BUN: 10 mg/dL (ref 6–24)
Bilirubin Total: 0.7 mg/dL (ref 0.0–1.2)
CO2: 21 mmol/L (ref 20–29)
Calcium: 9.3 mg/dL (ref 8.7–10.2)
Chloride: 101 mmol/L (ref 96–106)
Creatinine, Ser: 0.95 mg/dL (ref 0.76–1.27)
GFR calc Af Amer: 115 mL/min/{1.73_m2} (ref >59)
GFR calc non Af Amer: 100 mL/min/{1.73_m2} (ref >59)
Globulin, Total: 2.5 g/dL (ref 1.5–4.5)
Glucose: 88 mg/dL (ref 65–99)
Potassium: 4.1 mmol/L (ref 3.5–5.2)
Sodium: 137 mmol/L (ref 134–144)
Total Protein: 6.7 g/dL (ref 6.0–8.5)

## 2019-12-19 LAB — TSH: TSH: 1.8 u[IU]/mL (ref 0.450–4.500)

## 2019-12-19 LAB — CBC WITH DIFFERENTIAL/PLATELET
Basophils Absolute: 0.1 10*3/uL (ref 0.0–0.2)
Basos: 1 %
EOS (ABSOLUTE): 0.2 10*3/uL (ref 0.0–0.4)
Eos: 2 %
Hematocrit: 45.7 % (ref 37.5–51.0)
Hemoglobin: 16.1 g/dL (ref 13.0–17.7)
Immature Grans (Abs): 0 10*3/uL (ref 0.0–0.1)
Immature Granulocytes: 0 %
Lymphocytes Absolute: 2.3 10*3/uL (ref 0.7–3.1)
Lymphs: 25 %
MCH: 31.9 pg (ref 26.6–33.0)
MCHC: 35.2 g/dL (ref 31.5–35.7)
MCV: 91 fL (ref 79–97)
Monocytes Absolute: 0.6 10*3/uL (ref 0.1–0.9)
Monocytes: 7 %
Neutrophils Absolute: 5.9 10*3/uL (ref 1.4–7.0)
Neutrophils: 65 %
Platelets: 282 10*3/uL (ref 150–450)
RBC: 5.05 x10E6/uL (ref 4.14–5.80)
RDW: 12 % (ref 11.6–15.4)
WBC: 9.1 10*3/uL (ref 3.4–10.8)

## 2019-12-19 LAB — FOLATE: Folate: 10.7 ng/mL (ref >3.0)

## 2019-12-19 LAB — VITAMIN B12: Vitamin B-12: 734 pg/mL (ref 232–1245)

## 2019-12-19 LAB — VITAMIN D 25 HYDROXY (VIT D DEFICIENCY, FRACTURES): Vit D, 25-Hydroxy: 32.2 ng/mL (ref 30.0–100.0)

## 2020-01-26 ENCOUNTER — Other Ambulatory Visit: Payer: Self-pay | Admitting: Neurology

## 2020-04-20 ENCOUNTER — Encounter: Payer: Self-pay | Admitting: Internal Medicine

## 2020-04-20 ENCOUNTER — Other Ambulatory Visit: Payer: Self-pay

## 2020-04-20 ENCOUNTER — Ambulatory Visit (INDEPENDENT_AMBULATORY_CARE_PROVIDER_SITE_OTHER): Payer: 59 | Admitting: Internal Medicine

## 2020-04-20 VITALS — BP 132/82 | HR 89 | Temp 98.2°F | Ht 66.54 in | Wt 282.0 lb

## 2020-04-20 DIAGNOSIS — E785 Hyperlipidemia, unspecified: Secondary | ICD-10-CM

## 2020-04-20 DIAGNOSIS — J40 Bronchitis, not specified as acute or chronic: Secondary | ICD-10-CM | POA: Diagnosis not present

## 2020-04-20 DIAGNOSIS — I10 Essential (primary) hypertension: Secondary | ICD-10-CM | POA: Diagnosis not present

## 2020-04-20 DIAGNOSIS — J9801 Acute bronchospasm: Secondary | ICD-10-CM | POA: Diagnosis not present

## 2020-04-20 DIAGNOSIS — Z6841 Body Mass Index (BMI) 40.0 and over, adult: Secondary | ICD-10-CM

## 2020-04-20 DIAGNOSIS — Z0000 Encounter for general adult medical examination without abnormal findings: Secondary | ICD-10-CM | POA: Diagnosis not present

## 2020-04-20 MED ORDER — BREO ELLIPTA 200-25 MCG/INH IN AEPB: 1.0000 | INHALATION_SPRAY | Freq: Every day | RESPIRATORY_TRACT | 11 refills | Status: DC

## 2020-04-20 MED ORDER — ALBUTEROL SULFATE HFA 108 (90 BASE) MCG/ACT IN AERS: 1.0000 | INHALATION_SPRAY | RESPIRATORY_TRACT | 11 refills | Status: DC | PRN

## 2020-04-20 NOTE — Patient Instructions (Addendum)
COVID-19 Vaccine Information can be found at: [email or web address] For questions related to vaccine distribution or appointments, please email [email or web address] or call [phone].   Consider pfizer   Neutrogena ultrasheer  Lotion SPF 30   Back Exercises The following exercises strengthen the muscles that help to support the trunk and back. They also help to keep the lower back flexible. Doing these exercises can help to prevent back pain or lessen existing pain.  If you have back pain or discomfort, try doing these exercises 2-3 times each day or as told by your health care provider.  As your pain improves, do them once each day, but increase the number of times that you repeat the steps for each exercise (do more repetitions).  To prevent the recurrence of back pain, continue to do these exercises once each day or as told by your health care provider. Do exercises exactly as told by your health care provider and adjust them as directed. It is normal to feel mild stretching, pulling, tightness, or discomfort as you do these exercises, but you should stop right away if you feel sudden pain or your pain gets worse. Exercises Single knee to chest Repeat these steps 3-5 times for each leg: 1. Lie on your back on a firm bed or the floor with your legs extended. 2. Bring one knee to your chest. Your other leg should stay extended and in contact with the floor. 3. Hold your knee in place by grabbing your knee or thigh with both hands and hold. 4. Pull on your knee until you feel a gentle stretch in your lower back or buttocks. 5. Hold the stretch for 10-30 seconds. 6. Slowly release and straighten your leg. Pelvic tilt Repeat these steps 5-10 times: 1. Lie on your back on a firm bed or the floor with your legs extended. 2. Bend your knees so they are pointing toward the ceiling and your feet are flat on the floor. 3. Tighten your  lower abdominal muscles to press your lower back against the floor. This motion will tilt your pelvis so your tailbone points up toward the ceiling instead of pointing to your feet or the floor. 4. With gentle tension and even breathing, hold this position for 5-10 seconds. Cat-cow Repeat these steps until your lower back becomes more flexible: 1. Get into a hands-and-knees position on a firm surface. Keep your hands under your shoulders, and keep your knees under your hips. You may place padding under your knees for comfort. 2. Let your head hang down toward your chest. Contract your abdominal muscles and point your tailbone toward the floor so your lower back becomes rounded like the back of a cat. 3. Hold this position for 5 seconds. 4. Slowly lift your head, let your abdominal muscles relax and point your tailbone up toward the ceiling so your back forms a sagging arch like the back of a cow. 5. Hold this position for 5 seconds.  Press-ups Repeat these steps 5-10 times: 1. Lie on your abdomen (face-down) on the floor. 2. Place your palms near your head, about shoulder-width apart. 3. Keeping your back as relaxed as possible and keeping your hips on the floor, slowly straighten your arms to raise the top half of your body and lift your shoulders. Do not use your back muscles to raise your upper torso. You may adjust the placement of your hands to make yourself more comfortable. 4. Hold this position for 5 seconds while you keep your back relaxed. 5.  Slowly return to lying flat on the floor.  Bridges Repeat these steps 10 times: 1. Lie on your back on a firm surface. 2. Bend your knees so they are pointing toward the ceiling and your feet are flat on the floor. Your arms should be flat at your sides, next to your body. 3. Tighten your buttocks muscles and lift your buttocks off the floor until your waist is at almost the same height as your knees. You should feel the muscles working in your  buttocks and the back of your thighs. If you do not feel these muscles, slide your feet 1-2 inches farther away from your buttocks. 4. Hold this position for 3-5 seconds. 5. Slowly lower your hips to the starting position, and allow your buttocks muscles to relax completely. If this exercise is too easy, try doing it with your arms crossed over your chest. Abdominal crunches Repeat these steps 5-10 times: 1. Lie on your back on a firm bed or the floor with your legs extended. 2. Bend your knees so they are pointing toward the ceiling and your feet are flat on the floor. 3. Cross your arms over your chest. 4. Tip your chin slightly toward your chest without bending your neck. 5. Tighten your abdominal muscles and slowly raise your trunk (torso) high enough to lift your shoulder blades a tiny bit off the floor. Avoid raising your torso higher than that because it can put too much stress on your low back and does not help to strengthen your abdominal muscles. 6. Slowly return to your starting position. Back lifts Repeat these steps 5-10 times: 1. Lie on your abdomen (face-down) with your arms at your sides, and rest your forehead on the floor. 2. Tighten the muscles in your legs and your buttocks. 3. Slowly lift your chest off the floor while you keep your hips pressed to the floor. Keep the back of your head in line with the curve in your back. Your eyes should be looking at the floor. 4. Hold this position for 3-5 seconds. 5. Slowly return to your starting position. Contact a health care provider if:  Your back pain or discomfort gets much worse when you do an exercise.  Your worsening back pain or discomfort does not lessen within 2 hours after you exercise. If you have any of these problems, stop doing these exercises right away. Do not do them again unless your health care provider says that you can. Get help right away if:  You develop sudden, severe back pain. If this happens, stop  doing the exercises right away. Do not do them again unless your health care provider says that you can. This information is not intended to replace advice given to you by your health care provider. Make sure you discuss any questions you have with your health care provider. Document Revised: 02/12/2019 Document Reviewed: 07/10/2018 Elsevier Patient Education  [place] or Strain Rehab Ask your health care provider which exercises are safe for you. Do exercises exactly as told by your health care provider and adjust them as directed. It is normal to feel mild stretching, pulling, tightness, or discomfort as you do these exercises. Stop right away if you feel sudden pain or your pain gets worse. Do not begin these exercises until told by your health care provider. Stretching and range-of-motion exercises These exercises warm up your muscles and joints and improve the movement and flexibility of your back. These exercises also help  to relieve pain, numbness, and tingling. Lumbar rotation  1. Lie on your back on a firm surface and bend your knees. 2. Straighten your arms out to your sides so each arm forms a 90-degree angle (right angle) with a side of your body. 3. Slowly move (rotate) both of your knees to one side of your body until you feel a stretch in your lower back (lumbar). Try not to let your shoulders lift off the floor. 4. Hold this position for __________ seconds. 5. Tense your abdominal muscles and slowly move your knees back to the starting position. 6. Repeat this exercise on the other side of your body. Repeat __________ times. Complete this exercise __________ times a day. Single knee to chest  1. Lie on your back on a firm surface with both legs straight. 2. Bend one of your knees. Use your hands to move your knee up toward your chest until you feel a gentle stretch in your lower back and buttock. ? Hold your leg in this position by holding on to the  front of your knee. ? Keep your other leg as straight as possible. 3. Hold this position for __________ seconds. 4. Slowly return to the starting position. 5. Repeat with your other leg. Repeat __________ times. Complete this exercise __________ times a day. Prone extension on elbows  1. Lie on your abdomen on a firm surface (prone position). 2. Prop yourself up on your elbows. 3. Use your arms to help lift your chest up until you feel a gentle stretch in your abdomen and your lower back. ? This will place some of your body weight on your elbows. If this is uncomfortable, try stacking pillows under your chest. ? Your hips should stay down, against the surface that you are lying on. Keep your hip and back muscles relaxed. 4. Hold this position for __________ seconds. 5. Slowly relax your upper body and return to the starting position. Repeat __________ times. Complete this exercise __________ times a day. Strengthening exercises These exercises build strength and endurance in your back. Endurance is the ability to use your muscles for a long time, even after they get tired. Pelvic tilt This exercise strengthens the muscles that lie deep in the abdomen. 1. Lie on your back on a firm surface. Bend your knees and keep your feet flat on the floor. 2. Tense your abdominal muscles. Tip your pelvis up toward the ceiling and flatten your lower back into the floor. ? To help with this exercise, you may place a small towel under your lower back and try to push your back into the towel. 3. Hold this position for __________ seconds. 4. Let your muscles relax completely before you repeat this exercise. Repeat __________ times. Complete this exercise __________ times a day. Alternating arm and leg raises  1. Get on your hands and knees on a firm surface. If you are on a hard floor, you may want to use padding, such as an exercise mat, to cushion your knees. 2. Line up your arms and legs. Your hands  should be directly below your shoulders, and your knees should be directly below your hips. 3. Lift your left leg behind you. At the same time, raise your right arm and straighten it in front of you. ? Do not lift your leg higher than your hip. ? Do not lift your arm higher than your shoulder. ? Keep your abdominal and back muscles tight. ? Keep your hips facing the ground. ? Do  not arch your back. ? Keep your balance carefully, and do not hold your breath. 4. Hold this position for __________ seconds. 5. Slowly return to the starting position. 6. Repeat with your right leg and your left arm. Repeat __________ times. Complete this exercise __________ times a day. Abdominal set with straight leg raise  1. Lie on your back on a firm surface. 2. Bend one of your knees and keep your other leg straight. 3. Tense your abdominal muscles and lift your straight leg up, 4-6 inches (10-15 cm) off the ground. 4. Keep your abdominal muscles tight and hold this position for __________ seconds. ? Do not hold your breath. ? Do not arch your back. Keep it flat against the ground. 5. Keep your abdominal muscles tense as you slowly lower your leg back to the starting position. 6. Repeat with your other leg. Repeat __________ times. Complete this exercise __________ times a day. Single leg lower with bent knees 1. Lie on your back on a firm surface. 2. Tense your abdominal muscles and lift your feet off the floor, one foot at a time, so your knees and hips are bent in 90-degree angles (right angles). ? Your knees should be over your hips and your lower legs should be parallel to the floor. 3. Keeping your abdominal muscles tense and your knee bent, slowly lower one of your legs so your toe touches the ground. 4. Lift your leg back up to return to the starting position. ? Do not hold your breath. ? Do not let your back arch. Keep your back flat against the ground. 5. Repeat with your other leg. Repeat  __________ times. Complete this exercise __________ times a day. Posture and body mechanics Good posture and healthy body mechanics can help to relieve stress in your body's tissues and joints. Body mechanics refers to the movements and positions of your body while you do your daily activities. Posture is part of body mechanics. Good posture means:  Your spine is in its natural S-curve position (neutral).  Your shoulders are pulled back slightly.  Your head is not tipped forward. Follow these guidelines to improve your posture and body mechanics in your everyday activities. Standing   When standing, keep your spine neutral and your feet about hip width apart. Keep a slight bend in your knees. Your ears, shoulders, and hips should line up.  When you do a task in which you stand in one place for a long time, place one foot up on a stable object that is 2-4 inches (5-10 cm) high, such as a footstool. This helps keep your spine neutral. Sitting   When sitting, keep your spine neutral and keep your feet flat on the floor. Use a footrest, if necessary, and keep your thighs parallel to the floor. Avoid rounding your shoulders, and avoid tilting your head forward.  When working at a desk or a computer, keep your desk at a height where your hands are slightly lower than your elbows. Slide your chair under your desk so you are close enough to maintain good posture.  When working at a computer, place your monitor at a height where you are looking straight ahead and you do not have to tilt your head forward or downward to look at the screen. Resting  When lying down and resting, avoid positions that are most painful for you.  If you have pain with activities such as sitting, bending, stooping, or squatting, lie in a position in which your  body does not bend very much. For example, avoid curling up on your side with your arms and knees near your chest (fetal position).  If you have pain with  activities such as standing for a long time or reaching with your arms, lie with your spine in a neutral position and bend your knees slightly. Try the following positions: ? Lying on your side with a pillow between your knees. ? Lying on your back with a pillow under your knees. Lifting   When lifting objects, keep your feet at least shoulder width apart and tighten your abdominal muscles.  Bend your knees and hips and keep your spine neutral. It is important to lift using the strength of your legs, not your back. Do not lock your knees straight out.  Always ask for help to lift heavy or awkward objects. This information is not intended to replace advice given to you by your health care provider. Make sure you discuss any questions you have with your health care provider. Document Revised: 01/30/2019 Document Reviewed: 10/30/2018 Elsevier Patient Education  [employer].  Neck Exercises Ask your health care provider which exercises are safe for you. Do exercises exactly as told by your health care provider and adjust them as directed. It is normal to feel mild stretching, pulling, tightness, or discomfort as you do these exercises. Stop right away if you feel sudden pain or your pain gets worse. Do not begin these exercises until told by your health care provider. Neck exercises can be important for many reasons. They can improve strength and maintain flexibility in your neck, which will help your upper back and prevent neck pain. Stretching exercises Rotation neck stretching  1. Sit in a chair or stand up. 2. Place your feet flat on the floor, shoulder width apart. 3. Slowly turn your head (rotate) to the right until a slight stretch is felt. Turn it all the way to the right so you can look over your right shoulder. Do not tilt or tip your head. 4. Hold this position for 10-30 seconds. 5. Slowly turn your head (rotate) to the left until a slight stretch is felt. Turn it all the way  to the left so you can look over your left shoulder. Do not tilt or tip your head. 6. Hold this position for 10-30 seconds. Repeat __________ times. Complete this exercise __________ times a day. Neck retraction 1. Sit in a sturdy chair or stand up. 2. Look straight ahead. Do not bend your neck. 3. Use your fingers to push your chin backward (retraction). Do not bend your neck for this movement. Continue to face straight ahead. If you are doing the exercise properly, you will feel a slight sensation in your throat and a stretch at the back of your neck. 4. Hold the stretch for 1-2 seconds. Repeat __________ times. Complete this exercise __________ times a day. Strengthening exercises Neck press 1. Lie on your back on a firm bed or on the floor with a pillow under your head. 2. Use your neck muscles to push your head down on the pillow and straighten your spine. 3. Hold the position as well as you can. Keep your head facing up (in a neutral position) and your chin tucked. 4. Slowly count to 5 while holding this position. Repeat __________ times. Complete this exercise __________ times a day. Isometrics These are exercises in which you strengthen the muscles in your neck while keeping your neck still (isometrics). 1. Sit in a  supportive chair and place your hand on your forehead. 2. Keep your head and face facing straight ahead. Do not flex or extend your neck while doing isometrics. 3. Push forward with your head and neck while pushing back with your hand. Hold for 10 seconds. 4. Do the sequence again, this time putting your hand against the back of your head. Use your head and neck to push backward against the hand pressure. 5. Finally, do the same exercise on either side of your head, pushing sideways against the pressure of your hand. Repeat __________ times. Complete this exercise __________ times a day. Prone head lifts 1. Lie face-down (prone position), resting on your elbows so that  your chest and upper back are raised. 2. Start with your head facing downward, near your chest. Position your chin either on or near your chest. 3. Slowly lift your head upward. Lift until you are looking straight ahead. Then continue lifting your head as far back as you can comfortably stretch. 4. Hold your head up for 5 seconds. Then slowly lower it to your starting position. Repeat __________ times. Complete this exercise __________ times a day. Supine head lifts 1. Lie on your back (supine position), bending your knees to point to the ceiling and keeping your feet flat on the floor. 2. Lift your head slowly off the floor, raising your chin toward your chest. 3. Hold for 5 seconds. Repeat __________ times. Complete this exercise __________ times a day. Scapular retraction 1. Stand with your arms at your sides. Look straight ahead. 2. Slowly pull both shoulders (scapulae) backward and downward (retraction) until you feel a stretch between your shoulder blades in your upper back. 3. Hold for 10-30 seconds. 4. Relax and repeat. Repeat __________ times. Complete this exercise __________ times a day. Contact a health care provider if:  Your neck pain or discomfort gets much worse when you do an exercise.  Your neck pain or discomfort does not improve within 2 hours after you exercise. If you have any of these problems, stop exercising right away. Do not do the exercises again unless your health care provider says that you can. Get help right away if:  You develop sudden, severe neck pain. If this happens, stop exercising right away. Do not do the exercises again unless your health care provider says that you can. This information is not intended to replace advice given to you by your health care provider. Make sure you discuss any questions you have with your health care provider. Document Revised: 08/06/2018 Document Reviewed: 08/06/2018 Elsevier Patient Education  [place] DASH stands for "Dietary Approaches to Stop Hypertension." The DASH eating plan is a healthy eating plan that has been shown to reduce high blood pressure (hypertension). It may also reduce your risk for type 2 diabetes, heart disease, and stroke. The DASH eating plan may also help with weight loss. What are tips for following this plan?  General guidelines  Avoid eating more than 2,300 mg (milligrams) of salt (sodium) a day. If you have hypertension, you may need to reduce your sodium intake to 1,500 mg a day.  Limit alcohol intake to no more than 1 drink a day for nonpregnant women and 2 drinks a day for men. One drink equals 12 oz of beer, 5 oz of wine, or 1 oz of hard liquor.  Work with your health care provider to maintain a healthy body weight or to lose weight. Ask what an  ideal weight is for you.  Get at least 30 minutes of exercise that causes your heart to beat faster (aerobic exercise) most days of the week. Activities may include walking, swimming, or biking.  Work with your health care provider or diet and nutrition specialist (dietitian) to adjust your eating plan to your individual calorie needs. Reading food labels   Check food labels for the amount of sodium per serving. Choose foods with less than 5 percent of the Daily Value of sodium. Generally, foods with less than 300 mg of sodium per serving fit into this eating plan.  To find whole grains, look for the word "whole" as the first word in the ingredient list. Shopping  Buy products labeled as "low-sodium" or "no salt added."  Buy fresh foods. Avoid canned foods and premade or frozen meals. Cooking  Avoid adding salt when cooking. Use salt-free seasonings or herbs instead of table salt or sea salt. Check with your health care provider or pharmacist before using salt substitutes.  Do not fry foods. Cook foods using healthy methods such as baking, boiling, grilling, and broiling  instead.  Cook with heart-healthy oils, such as olive, canola, soybean, or sunflower oil. Meal planning  Eat a balanced diet that includes: ? 5 or more servings of fruits and vegetables each day. At each meal, try to fill half of your plate with fruits and vegetables. ? Up to 6-8 servings of whole grains each day. ? Less than 6 oz of lean meat, poultry, or fish each day. A 3-oz serving of meat is about the same size as a deck of cards. One egg equals 1 oz. ? 2 servings of low-fat dairy each day. ? A serving of nuts, seeds, or beans 5 times each week. ? Heart-healthy fats. Healthy fats called Omega-3 fatty acids are found in foods such as flaxseeds and coldwater fish, like sardines, salmon, and mackerel.  Limit how much you eat of the following: ? Canned or prepackaged foods. ? Food that is high in trans fat, such as fried foods. ? Food that is high in saturated fat, such as fatty meat. ? Sweets, desserts, sugary drinks, and other foods with added sugar. ? Full-fat dairy products.  Do not salt foods before eating.  Try to eat at least 2 vegetarian meals each week.  Eat more home-cooked food and less restaurant, buffet, and fast food.  When eating at a restaurant, ask that your food be prepared with less salt or no salt, if possible. What foods are recommended? The items listed may not be a complete list. Talk with your dietitian about what dietary choices are best for you. Grains Whole-grain or whole-wheat bread. Whole-grain or whole-wheat pasta. Brown rice. [place]. Bulgur. Whole-grain and low-sodium cereals. Pita bread. Low-fat, low-sodium crackers. Whole-wheat flour tortillas. Vegetables Fresh or frozen vegetables (raw, steamed, roasted, or grilled). Low-sodium or reduced-sodium tomato and vegetable juice. Low-sodium or reduced-sodium tomato sauce and tomato paste. Low-sodium or reduced-sodium canned vegetables. Fruits All fresh, dried, or frozen fruit. Canned fruit in  natural juice (without added sugar). Meat and other protein foods Skinless chicken or [place]. Ground chicken or [place]. Pork with fat trimmed off. Fish and seafood. Egg whites. Dried beans, peas, or lentils. Unsalted nuts, nut butters, and seeds. Unsalted canned beans. Lean cuts of beef with fat trimmed off. Low-sodium, lean deli meat. Dairy Low-fat (1%) or fat-free (skim) milk. Fat-free, low-fat, or reduced-fat cheeses. Nonfat, low-sodium ricotta or cottage cheese. Low-fat or nonfat yogurt. Low-fat, low-sodium cheese.  Fats and oils Soft margarine without trans fats. Vegetable oil. Low-fat, reduced-fat, or light mayonnaise and salad dressings (reduced-sodium). Canola, safflower, olive, soybean, and sunflower oils. Avocado. Seasoning and other foods Herbs. Spices. Seasoning mixes without salt. Unsalted popcorn and pretzels. Fat-free sweets. What foods are not recommended? The items listed may not be a complete list. Talk with your dietitian about what dietary choices are best for you. Grains Baked goods made with fat, such as croissants, muffins, or some breads. Dry pasta or rice meal packs. Vegetables Creamed or fried vegetables. Vegetables in a cheese sauce. Regular canned vegetables (not low-sodium or reduced-sodium). Regular canned tomato sauce and paste (not low-sodium or reduced-sodium). Regular tomato and vegetable juice (not low-sodium or reduced-sodium). Angie Fava. Olives. Fruits Canned fruit in a light or heavy syrup. Fried fruit. Fruit in cream or butter sauce. Meat and other protein foods Fatty cuts of meat. Ribs. Fried meat. Berniece Salines. Sausage. Bologna and other processed lunch meats. Salami. Fatback. Hotdogs. Bratwurst. Salted nuts and seeds. Canned beans with added salt. Canned or smoked fish. Whole eggs or egg yolks. Chicken or [place] with skin. Dairy Whole or 2% milk, cream, and half-and-half. Whole or full-fat cream cheese. Whole-fat or sweetened yogurt. Full-fat cheese. Nondairy  creamers. Whipped toppings. Processed cheese and cheese spreads. Fats and oils Butter. Stick margarine. Lard. Shortening. Ghee. Bacon fat. Tropical oils, such as coconut, palm kernel, or palm oil. Seasoning and other foods Salted popcorn and pretzels. Onion salt, garlic salt, seasoned salt, table salt, and sea salt. Worcestershire sauce. Tartar sauce. Barbecue sauce. Teriyaki sauce. Soy sauce, including reduced-sodium. Steak sauce. Canned and packaged gravies. Fish sauce. Oyster sauce. Cocktail sauce. Horseradish that you find on the shelf. Ketchup. Mustard. Meat flavorings and tenderizers. Bouillon cubes. Hot sauce and Tabasco sauce. Premade or packaged marinades. Premade or packaged taco seasonings. Relishes. Regular salad dressings. Where to find more information:  National Heart, Lung, and [place]: https://wilson-eaton.com/  American Heart Association: www.heart.org Summary  The DASH eating plan is a healthy eating plan that has been shown to reduce high blood pressure (hypertension). It may also reduce your risk for type 2 diabetes, heart disease, and stroke.  With the DASH eating plan, you should limit salt (sodium) intake to 2,300 mg a day. If you have hypertension, you may need to reduce your sodium intake to 1,500 mg a day.  When on the DASH eating plan, aim to eat more fresh fruits and vegetables, whole grains, lean proteins, low-fat dairy, and heart-healthy fats.  Work with your health care provider or diet and nutrition specialist (dietitian) to adjust your eating plan to your individual calorie needs. This information is not intended to replace advice given to you by your health care provider. Make sure you discuss any questions you have with your health care provider. Document Revised: 09/20/2017 Document Reviewed: 10/01/2016 Elsevier Patient Education  2020 [employer].

## 2020-04-20 NOTE — Progress Notes (Signed)
Chief Complaint  Patient presents with  . Annual Exam   Annual  1. HTN on norvasc 2.5 mg bid BP overall at goal  2. Restless legs and leg pain worse at night pt thinks nerve related on gabapentin 100 mg qhs prn with MRI brain neg and MRI C spine with some arthritis changes, disc bulging mild C7 narrowing gabapentin 100 mg qhs prn helps some with his lower leg sx's but 300 mg made him too sleepy  3. HLD will check labs 06/24/20  4. Plantar fascitis flare f/u with podiatry and chiropractor  5. Obesity with FH with dad and dads side of family saw Dr. Darnell Level but declines surgery due to cost $8000  6. Asthma suspected breo inhaler and albuterol helping with breathing issues though not formally had pfts   Review of Systems  Constitutional: Negative for weight loss.  HENT: Negative for hearing loss.   Eyes: Negative for blurred vision.  Respiratory: Negative for shortness of breath.   Cardiovascular: Negative for chest pain.  Gastrointestinal: Negative for abdominal pain.  Musculoskeletal: Negative for falls.  Skin: Negative for rash.  Neurological: Negative for headaches.  Psychiatric/Behavioral: Negative for depression.   Past Medical History:  Diagnosis Date  . Asthma 11/06/2019  . Chicken pox   . Hyperlipidemia   . Hypertension   . Nocturnal leg cramps 12/04/2017  . Obesity   . Obstructive sleep apnea    Past Surgical History:  Procedure Laterality Date  . WISDOM TOOTH EXTRACTION     Family History  Problem Relation Age of Onset  . Hypertension Mother   . Other Mother        fatty liver  . Diabetes Father   . Cancer Maternal Grandmother        colon, liver  . Hypertension Maternal Grandmother   . Other Maternal Grandmother        brain anerusym, mini stroke  . Cancer Maternal Grandfather        prostate in 33s  . Hypertension Maternal Grandfather   . Anemia Paternal Grandmother   . Cancer Paternal Grandfather        leukemia  . Hypertension Maternal Aunt    Social  History   Socioeconomic History  . Marital status: Married    Spouse name: Not on file  . Number of children: 1  . Years of education: 12+  . Highest education level: Not on file  Occupational History  . Occupation: [employer]  Tobacco Use  . Smoking status: Never Smoker  . Smokeless tobacco: Former Systems developer    Types: Secondary school teacher  . Vaping Use: Never used  Substance and Sexual Activity  . Alcohol use: Yes    Alcohol/week: 2.0 standard drinks    Types: 2 Cans of beer per week    Comment: on occasion  . Drug use: No  . Sexual activity: Yes  Other Topics Concern  . Not on file  Social History Narrative   Lives with wife   Caffeine use: 1 cup coffee or 1 sugar free redbull   Works as Conservation officer, historic buildings x 16 years    Married    Left-handed   Social Determinants of Radio broadcast assistant Strain:   . Difficulty of Paying Living Expenses:   Food Insecurity:   . Worried About Charity fundraiser in the Last Year:   . [place] in the Last Year:   Transportation Needs:   . Lack of  Transportation (Medical):   Marland Kitchen Lack of Transportation (Non-Medical):   Physical Activity:   . Days of Exercise per Week:   . Minutes of Exercise per Session:   Stress:   . Feeling of Stress :   Social Connections:   . Frequency of Communication with Friends and Family:   . Frequency of Social Gatherings with Friends and Family:   . Attends Religious Services:   . Active Member of Clubs or Organizations:   . Attends Archivist Meetings:   Marland Kitchen Marital Status:   Intimate Partner Violence:   . Fear of Current or Ex-Partner:   . Emotionally Abused:   Marland Kitchen Physically Abused:   . Sexually Abused:    Current Meds  Medication Sig  . albuterol (VENTOLIN HFA) 108 (90 Base) MCG/ACT inhaler Inhale 1-2 puffs into the lungs every 4 (four) hours as needed for wheezing or shortness of breath.  Marland Kitchen amLODipine (NORVASC) 2.5 MG tablet Take 1 tablet (2.5 mg total) by mouth 2 (two)  times daily.  Marland Kitchen ascorbic acid (VITAMIN C) 500 MG tablet Take by mouth daily.  . Cholecalciferol (VITAMIN D3 PO) Take by mouth. Takes 5000 units daily  . fluticasone furoate-vilanterol (BREO ELLIPTA) 200-25 MCG/INH AEPB Inhale 1 puff into the lungs daily. Rinse mouth after use.  . gabapentin (NEURONTIN) 100 MG capsule Take 100 mg by mouth at bedtime as needed.   . Multiple Vitamins-Minerals (ZINC PO) Take by mouth daily.  . multivitamin (ONE-A-DAY MEN'S) TABS tablet Take 1 tablet by mouth daily.  . [DISCONTINUED] albuterol (VENTOLIN HFA) 108 (90 Base) MCG/ACT inhaler Inhale 1-2 puffs into the lungs every 4 (four) hours as needed for wheezing or shortness of breath.  . [DISCONTINUED] fluticasone furoate-vilanterol (BREO ELLIPTA) 200-25 MCG/INH AEPB Inhale 1 puff into the lungs daily. Rinse mouth after use.   Allergies  Allergen Reactions  . Lisinopril     Cough   . Amoxicillin     Itching/crawling sensation     No results found for this or any previous visit (from the past 2160 hour(s)). Objective  Body mass index is 44.79 kg/m. Wt Readings from Last 3 Encounters:  04/20/20 282 lb (127.9 kg)  11/06/19 285 lb 6.4 oz (129.5 kg)  10/21/19 265 lb (120.2 kg)   Temp Readings from Last 3 Encounters:  04/20/20 98.2 F (36.8 C) (Oral)  11/06/19 (!) 97.2 F (36.2 C) (Temporal)  08/12/19 97.7 F (36.5 C) (Oral)   BP Readings from Last 3 Encounters:  04/20/20 132/82  11/06/19 130/84  10/21/19 125/85   Pulse Readings from Last 3 Encounters:  04/20/20 89  11/06/19 95  08/12/19 85    Physical Exam Vitals and nursing note reviewed.  Constitutional:      Appearance: Normal appearance. He is well-developed and well-groomed. He is morbidly obese.  HENT:     Head: Normocephalic and atraumatic.  Eyes:     Conjunctiva/sclera: Conjunctivae normal.     Pupils: Pupils are equal, round, and reactive to light.  Cardiovascular:     Rate and Rhythm: Normal rate and regular rhythm.      Heart sounds: Normal heart sounds. No murmur heard.   Pulmonary:     Effort: Pulmonary effort is normal.     Breath sounds: Normal breath sounds.  Abdominal:     General: Abdomen is flat. Bowel sounds are normal.     Tenderness: There is no abdominal tenderness.  Skin:    General: Skin is warm and dry.  Neurological:  General: No focal deficit present.     Mental Status: He is alert and oriented to person, place, and time. Mental status is at baseline.     Gait: Gait normal.  Psychiatric:        Attention and Perception: Attention and perception normal.        Mood and Affect: Mood and affect normal.        Speech: Speech normal.        Behavior: Behavior normal. Behavior is cooperative.        Thought Content: Thought content normal.        Cognition and Memory: Cognition and memory normal.        Judgment: Judgment normal.     Assessment  Plan  Annual physical exam Had flu shotutd8/31/2020 covid vaccine declines for now rec MMR has not had yet HepA/B immunewith h/o fatty liver Colonoscopy screening 45-49   PSAnormal 01/07/19 0.8  Skin no spots  rec healthy diet and exercise he is working out 5 x per week and doing martial arts   Bronchitis/bronchospasm with suspected asthma - Plan: albuterol (VENTOLIN HFA) 108 (90 Base) MCG/ACT inhaler flovent  F/u pulm prn  May need formal pfts in the future   Essential hypertension - Plan: Comprehensive metabolic panel, Lipid panel, CBC with Differential/Platelet norvasc 2.5 mg bid   Hyperlipidemia, unspecified hyperlipidemia type - Plan: Lipid panel Due 06/24/20 or after given [facility] sheet today   Morbid obesity with BMI of 40.0-44.9, adult [facility])  Consult Dr. Kreg Shropshire due to cost will hold on wt loss surgery      Provider: Dr. Olivia Mackie McLean-Scocuzza-Internal Medicine

## 2020-04-20 NOTE — Progress Notes (Signed)
Patient flagged for BMI follow up due to BMI of 44.79 recorded from height and weight taken today.

## 2020-05-04 ENCOUNTER — Encounter: Payer: Self-pay | Admitting: Podiatry

## 2020-05-04 ENCOUNTER — Ambulatory Visit (INDEPENDENT_AMBULATORY_CARE_PROVIDER_SITE_OTHER): Payer: 59

## 2020-05-04 ENCOUNTER — Ambulatory Visit (INDEPENDENT_AMBULATORY_CARE_PROVIDER_SITE_OTHER): Payer: 59 | Admitting: Podiatry

## 2020-05-04 ENCOUNTER — Other Ambulatory Visit: Payer: Self-pay

## 2020-05-04 DIAGNOSIS — M722 Plantar fascial fibromatosis: Secondary | ICD-10-CM | POA: Diagnosis not present

## 2020-05-04 MED ORDER — METHYLPREDNISOLONE 4 MG PO TBPK: ORAL_TABLET | ORAL | 0 refills | Status: DC

## 2020-05-04 MED ORDER — MELOXICAM 15 MG PO TABS: 15.0000 mg | ORAL_TABLET | Freq: Every day | ORAL | 3 refills | Status: DC

## 2020-05-04 NOTE — Progress Notes (Signed)
Subjective:  Patient ID: Caleb [name], male    DOB: 1980-11-16,  MRN: [id number] HPI Chief Complaint  Patient presents with  . Foot Pain    patient presents today for left heel pain x 1 month.  He says its painful when he first stands up in the mornings and get better after walking a few minutes.  "it feels like a pin sticking in my heel when I walk"  He has iced and stretching for relief    39 y.o. male presents with the above complaint.   ROS: Denies fever chills nausea vomiting muscle aches pains calf pain back pain chest pain shortness of breath.  Past Medical History:  Diagnosis Date  . Asthma 11/06/2019  . Chicken pox   . Hyperlipidemia   . Hypertension   . Nocturnal leg cramps 12/04/2017  . Obesity   . Obstructive sleep apnea    Past Surgical History:  Procedure Laterality Date  . WISDOM TOOTH EXTRACTION      Current Outpatient Medications:  .  albuterol (VENTOLIN HFA) 108 (90 Base) MCG/ACT inhaler, Inhale 1-2 puffs into the lungs every 4 (four) hours as needed for wheezing or shortness of breath., Disp: 18 g, Rfl: 11 .  amLODipine (NORVASC) 2.5 MG tablet, Take 1 tablet (2.5 mg total) by mouth 2 (two) times daily., Disp: 180 tablet, Rfl: 3 .  ascorbic acid (VITAMIN C) 500 MG tablet, Take by mouth daily., Disp: , Rfl:  .  Cholecalciferol (VITAMIN D3 PO), Take by mouth. Takes 5000 units daily, Disp: , Rfl:  .  fluticasone furoate-vilanterol (BREO ELLIPTA) 200-25 MCG/INH AEPB, Inhale 1 puff into the lungs daily. Rinse mouth after use., Disp: 1 each, Rfl: 11 .  gabapentin (NEURONTIN) 100 MG capsule, Take 100 mg by mouth at bedtime as needed. , Disp: , Rfl:  .  meloxicam (MOBIC) 15 MG tablet, Take 1 tablet (15 mg total) by mouth daily., Disp: 30 tablet, Rfl: 3 .  methylPREDNISolone (MEDROL DOSEPAK) 4 MG TBPK tablet, 6 day dose pack - take as directed, Disp: 21 tablet, Rfl: 0 .  Multiple Vitamins-Minerals (ZINC PO), Take by mouth daily., Disp: , Rfl:  .  multivitamin  (ONE-A-DAY MEN'S) TABS tablet, Take 1 tablet by mouth daily., Disp: , Rfl:   Allergies  Allergen Reactions  . Lisinopril     Cough   . Amoxicillin     Itching/crawling sensation     Review of Systems Objective:  There were no vitals filed for this visit.  General: Well developed, nourished, in no acute distress, alert and oriented x3   Dermatological: Skin is warm, dry and supple bilateral. Nails x 10 are well maintained; remaining integument appears unremarkable at this time. There are no open sores, no preulcerative lesions, no rash or signs of infection present.  Vascular: Dorsalis Pedis artery and Posterior Tibial artery pedal pulses are 2/4 bilateral with immedate capillary fill time. Pedal hair growth present. No varicosities and no lower extremity edema present bilateral.   Neruologic: Grossly intact via light touch bilateral. Vibratory intact via tuning fork bilateral. Protective threshold with Semmes Wienstein monofilament intact to all pedal sites bilateral. Patellar and Achilles deep tendon reflexes 2+ bilateral. No Babinski or clonus noted bilateral.   Musculoskeletal: No gross boney pedal deformities bilateral. No pain, crepitus, or limitation noted with foot and ankle range of motion bilateral. Muscular strength 5/5 in all groups tested bilateral. He has pain on palpation medial aspect of the calcaneal tubercle and central calcaneal tubercle consistent with  plantar fasciitis.   Gait: Unassisted, Nonantalgic.    Radiographs:  Radiographs of the left foot today taken demonstrate an osseously mature individual soft tissue increase in density plantar fascial cannula insertion site no fractures or acute findings identified. Small plantar distally oriented calcaneal spurs noted.  Assessment & Plan:   Assessment:. Plantar fasciitis left.    Plan: Discussed etiology pathology conservative surgical therapies at this point went ahead and started him on a Medrol Dosepak to  be followed by meloxicam. Injected the left heel 20 mg Kenalog 5 mg Marcaine point maximal tenderness placement plantar fascial brace. I will follow-up with him in 1 month     Mackena Plummer T. [place], [place]

## 2020-05-04 NOTE — Patient Instructions (Signed)

## 2020-06-02 ENCOUNTER — Emergency Department: Payer: 59

## 2020-06-02 ENCOUNTER — Emergency Department
Admission: EM | Admit: 2020-06-02 | Discharge: 2020-06-02 | Disposition: A | Discharge status: Home / Self Care | Payer: 59 | Attending: Emergency Medicine | Admitting: Emergency Medicine

## 2020-06-02 ENCOUNTER — Other Ambulatory Visit: Payer: Self-pay

## 2020-06-02 DIAGNOSIS — Y939 Activity, unspecified: Secondary | ICD-10-CM | POA: Diagnosis not present

## 2020-06-02 DIAGNOSIS — W010XXA Fall on same level from slipping, tripping and stumbling without subsequent striking against object, initial encounter: Secondary | ICD-10-CM | POA: Insufficient documentation

## 2020-06-02 DIAGNOSIS — S00532A Contusion of oral cavity, initial encounter: Secondary | ICD-10-CM | POA: Insufficient documentation

## 2020-06-02 DIAGNOSIS — S0340XA Sprain of jaw, unspecified side, initial encounter: Secondary | ICD-10-CM

## 2020-06-02 DIAGNOSIS — Y999 Unspecified external cause status: Secondary | ICD-10-CM | POA: Insufficient documentation

## 2020-06-02 DIAGNOSIS — Z79899 Other long term (current) drug therapy: Secondary | ICD-10-CM | POA: Diagnosis not present

## 2020-06-02 DIAGNOSIS — I10 Essential (primary) hypertension: Secondary | ICD-10-CM | POA: Diagnosis not present

## 2020-06-02 DIAGNOSIS — Z7952 Long term (current) use of systemic steroids: Secondary | ICD-10-CM | POA: Insufficient documentation

## 2020-06-02 DIAGNOSIS — Y929 Unspecified place or not applicable: Secondary | ICD-10-CM | POA: Insufficient documentation

## 2020-06-02 DIAGNOSIS — S0343XA Sprain of jaw, bilateral, initial encounter: Secondary | ICD-10-CM | POA: Insufficient documentation

## 2020-06-02 DIAGNOSIS — Z7951 Long term (current) use of inhaled steroids: Secondary | ICD-10-CM | POA: Diagnosis not present

## 2020-06-02 DIAGNOSIS — S0083XA Contusion of other part of head, initial encounter: Secondary | ICD-10-CM

## 2020-06-02 DIAGNOSIS — J45909 Unspecified asthma, uncomplicated: Secondary | ICD-10-CM | POA: Diagnosis not present

## 2020-06-02 DIAGNOSIS — S0993XA Unspecified injury of face, initial encounter: Secondary | ICD-10-CM | POA: Diagnosis present

## 2020-06-02 MED ORDER — CYCLOBENZAPRINE HCL 5 MG PO TABS: 5.0000 mg | ORAL_TABLET | Freq: Three times a day (TID) | ORAL | 0 refills | Status: DC | PRN

## 2020-06-02 NOTE — ED Triage Notes (Signed)
Patient reports being kicked in the left jaw in martial arts class this evening. Patient reports he can't close right jaw.

## 2020-06-02 NOTE — ED Provider Notes (Signed)
[facility] Emergency Department Provider Note ____________________________________________  Time seen: 2100  I have reviewed the triage vital signs and the nursing notes.  HISTORY  Chief Complaint  Jaw Pain  HPI Caleb [name] is a 39 y.o. male presents himself to the ED for evaluation of an acute injury to the lower jaw.  Patient was at his martial arts class today, when he apparently dodged a kick.  He slipped and fell, landing on the patient's flexed knee.  He caught the knee to the left side of his jaw, and caused an acute brain to the lower jaw.  Patient denies any dental injury, lip laceration, or tongue biting injury.  He presents with tightness to the jaw on the right side, and reports inability to completely close the jaw.   Patient denies any loss of consciousness, hearing loss, tinnitus, vertigo, or headache.  He also denies any catching, clicking, or locking of the lower jaw.  Past Medical History:  Diagnosis Date  . Asthma 11/06/2019  . Chicken pox   . Hyperlipidemia   . Hypertension   . Nocturnal leg cramps 12/04/2017  . Obesity   . Obstructive sleep apnea     Patient Active Problem List   Diagnosis Date Noted  . Chronic fatigue 11/09/2019  . Chronic pain of right knee 11/09/2019  . Snoring 11/09/2019  . Asthma 11/06/2019  . Lumbosacral radiculopathy at L5 08/12/2019  . Neuropathy 08/12/2019  . Morbid obesity with BMI of 40.0-44.9, adult ([place]) 01/06/2019  . Right testicular pain 12/24/2018  . Fatty liver 01/29/2018  . Acute bronchitis 01/07/2018  . Nocturnal leg cramps 12/04/2017  . Polyarthralgia 12/28/2016  . Plantar fasciitis 12/04/2015  . Obesity (BMI 30-39.9) 12/04/2015  . Annual physical exam 12/04/2015  . Hypertension 06/16/2015  . Hyperlipidemia 06/16/2015  . Obstructive sleep apnea 06/16/2015    Past Surgical History:  Procedure Laterality Date  . WISDOM TOOTH EXTRACTION      Prior to Admission medications   Medication  Sig Start Date End Date Taking? Authorizing Provider  albuterol (VENTOLIN HFA) 108 (90 Base) MCG/ACT inhaler Inhale 1-2 puffs into the lungs every 4 (four) hours as needed for wheezing or shortness of breath. 04/20/20   McLean-Scocuzza, Nino Glow, MD  amLODipine (NORVASC) 2.5 MG tablet Take 1 tablet (2.5 mg total) by mouth 2 (two) times daily. 08/12/19   McLean-Scocuzza, Nino Glow, MD  ascorbic acid (VITAMIN C) 500 MG tablet Take by mouth daily.    [provider]  Cholecalciferol (VITAMIN D3 PO) Take by mouth. Takes 5000 units daily    [provider]  cyclobenzaprine (FLEXERIL) 5 MG tablet Take 1 tablet (5 mg total) by mouth 3 (three) times daily as needed. 06/02/20   Alarik Radu, Dannielle Karvonen, PA-C  fluticasone furoate-vilanterol (BREO ELLIPTA) 200-25 MCG/INH AEPB Inhale 1 puff into the lungs daily. Rinse mouth after use. 04/20/20   McLean-Scocuzza, Nino Glow, MD  gabapentin (NEURONTIN) 100 MG capsule Take 100 mg by mouth at bedtime as needed.  02/22/20 02/21/21  [provider]  meloxicam (MOBIC) 15 MG tablet Take 1 tablet (15 mg total) by mouth daily. 05/04/20   Hyatt, Max T, DPM  methylPREDNISolone (MEDROL DOSEPAK) 4 MG TBPK tablet 6 day dose pack - take as directed 05/04/20   Hyatt, Max T, DPM  Multiple Vitamins-Minerals (ZINC PO) Take by mouth daily.    [provider]  multivitamin (ONE-A-DAY MEN'S) TABS tablet Take 1 tablet by mouth daily.    [provider]    Allergies Lisinopril and Amoxicillin  Family History  Problem Relation Age of Onset  . Hypertension Mother   . Other Mother        fatty liver  . Diabetes Father   . Cancer Maternal Grandmother        colon, liver  . Hypertension Maternal Grandmother   . Other Maternal Grandmother        brain anerusym, mini stroke  . Cancer Maternal Grandfather        prostate in 88s  . Hypertension Maternal Grandfather   . Anemia Paternal Grandmother   . Cancer Paternal Grandfather        leukemia  .  Hypertension Maternal Aunt     Social History Social History   Tobacco Use  . Smoking status: Never Smoker  . Smokeless tobacco: Former Systems developer    Types: Secondary school teacher  . Vaping Use: Never used  Substance Use Topics  . Alcohol use: Yes    Alcohol/week: 2.0 standard drinks    Types: 2 Cans of beer per week    Comment: on occasion  . Drug use: No    Review of Systems  Constitutional: Negative for fever. Eyes: Negative for visual changes. ENT: Negative for sore throat.  Jaw pain as above. Cardiovascular: Negative for chest pain. Respiratory: Negative for shortness of breath. Musculoskeletal: Negative for back pain. Skin: Negative for rash. Neurological: Negative for headaches, focal weakness or numbness. ____________________________________________  PHYSICAL EXAM:  VITAL SIGNS: ED Triage Vitals  Enc Vitals Group     BP 06/02/20 2027 (!) 143/102     Pulse Rate 06/02/20 2027 (!) 102     Resp 06/02/20 2027 19     Temp 06/02/20 2027 99.2 F (37.3 C)     Temp Source 06/02/20 2228 Oral     SpO2 06/02/20 2027 94 %     Weight 06/02/20 2028 279 lb 15.8 oz (127 kg)     Height 06/02/20 2028 5\' 6"  (1.676 m)     Head Circumference --      Peak Flow --      Pain Score 06/02/20 2027 0     Pain Loc --      Pain Edu? --      Excl. in [place]? --     Constitutional: Alert and oriented. Well appearing and in no distress. Head: Normocephalic and atraumatic. Eyes: Conjunctivae are normal. Normal extraocular movements Mouth/Throat: Mucous membranes are moist.  No deformity or dislocation noted to the lower jaw.  Patient is able to perform flexion and extension range of the lower jaw but is unable to completely articulate the teeth.  The gap appears to be less than a centimeter.  No dislocation appreciated with palpation at the TMJs bilaterally.  No dental injury, lip laceration, or tongue injury is appreciated.  Patient is able to control oral secretions.  Neck: Supple.  Normal range of  motion without crepitus. Cardiovascular: Normal rate, regular rhythm. Normal distal pulses. Respiratory: Normal respiratory effort.  Musculoskeletal: Nontender with normal range of motion in all extremities.  Neurologic:  Normal gait without ataxia. Normal speech and language. No gross focal neurologic deficits are appreciated. Skin:  Skin is warm, dry and intact. No rash noted. ____________________________________________   RADIOLOGY  CT Maxillofacial  IMPRESSION: No acute/traumatic facial bone fractures. ____________________________________________  PROCEDURES  Procedures ____________________________________________  INITIAL IMPRESSION / ASSESSMENT AND PLAN / ED COURSE  DDX: TMJ sprain; mandible fracture; mandible dislocation  Patient with ED  evaluation of acute jaw pain after mechanical injury.  Patient presents with incomplete occlusal of his lower jaw.  CT imaging did not reveal any acute fracture or TMJ dislocation.  Patient symptoms likely represent a TMJ sprain.  Patient will be discharged with instruction for cyclobenzaprine to take as directed.  He is encouraged to start a soft food diet over the weekend until symptoms improve.  He should avoid excessive chewing and biting and will follow up with ENT if symptoms persist.  Caleb [name] was evaluated in Emergency Department on 06/02/2020 for the symptoms described in the history of present illness. He was evaluated in the context of the global COVID-19 pandemic, which necessitated consideration that the patient might be at risk for infection with the SARS-CoV-2 virus that causes COVID-19. Institutional protocols and algorithms that pertain to the evaluation of patients at risk for COVID-19 are in a state of rapid change based on information released by regulatory bodies including the CDC and federal and state organizations. These policies and algorithms were followed during the patient's care in the  ED. ____________________________________________  FINAL CLINICAL IMPRESSION(S) / ED DIAGNOSES  Final diagnoses:  Contusion of jaw, initial encounter  TMJ (sprain of temporomandibular joint), initial encounter      Melvenia Needles, PA-C 06/02/20 2331    Nena Polio, MD 06/03/20 0040

## 2020-06-02 NOTE — Discharge Instructions (Addendum)
Your exam and CT scan are normal at this time. There is no evidence of a jaw fracture or dislocation. Take the prescription med as directed.

## 2020-06-06 ENCOUNTER — Encounter: Payer: 59 | Admitting: Podiatry

## 2020-06-23 ENCOUNTER — Telehealth: Payer: Self-pay | Admitting: Internal Medicine

## 2020-06-23 NOTE — Telephone Encounter (Signed)
lft vm to follow up on pt MRI if it was done.

## 2020-06-28 NOTE — Telephone Encounter (Signed)
Ok. Thank you.

## 2020-06-28 NOTE — Telephone Encounter (Signed)
Pt called to let you know that he has already had his MRI

## 2020-07-25 DIAGNOSIS — G4733 Obstructive sleep apnea (adult) (pediatric): Secondary | ICD-10-CM

## 2020-07-25 NOTE — Telephone Encounter (Signed)
Tammy, please see pt's mychart message and advise. 

## 2020-07-26 NOTE — Telephone Encounter (Signed)
That is fine ,please order new CPAP with same settings  Insurance may require ov with download but can order if denies than can set up ov

## 2021-02-24 IMAGING — CT CT ABD-PELV W/ CM
2 of 4 series · 16 of 46 positions shown, 18 images · IV contrast (APPLIED)
Comparison: Scrotal ultrasound earlier this day. Abdominal CT
07/30/2016

CLINICAL DATA: Lower abdominal pain with clinical concern for right
inguinal hernia.

EXAM:
CT ABDOMEN AND PELVIS WITH CONTRAST
TECHNIQUE: Multidetector CT imaging of the abdomen and pelvis was performed
using the standard protocol following bolus administration of
intravenous contrast.
CONTRAST:  125mL OMNIPAQUE IOHEXOL 300 MG/ML  SOLN

[Series 2: routine abd/pel with · axial · 0.98mm/px · z∈[-943,-413]mm · 13 of 116 slices shown, 15 images]
[im 5/116  soft-tissue]
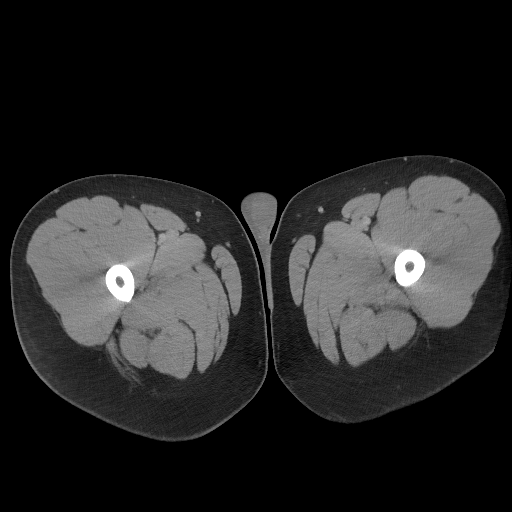
[im 5/116  bone]
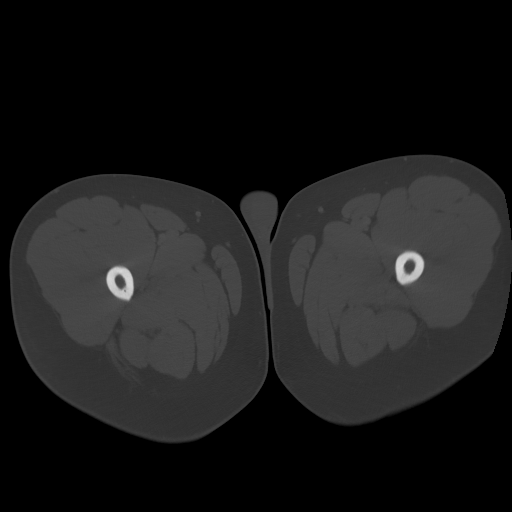
[im 14/116  soft-tissue]
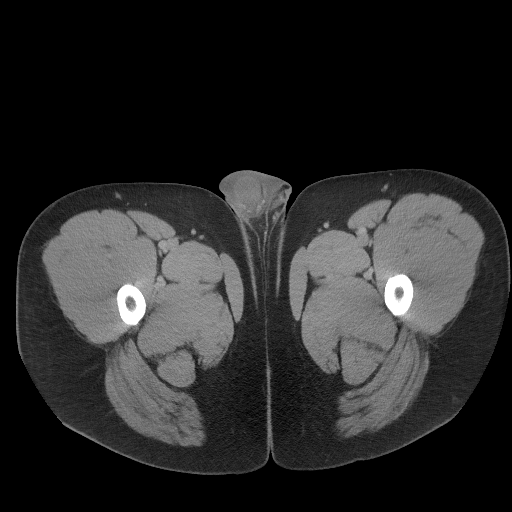
[im 24/116  soft-tissue]
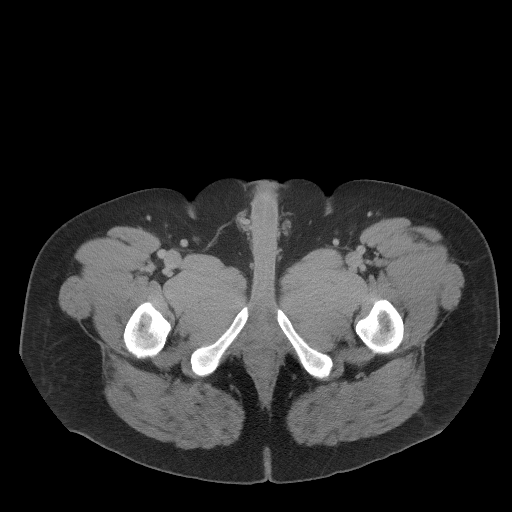
[im 33/116  soft-tissue]
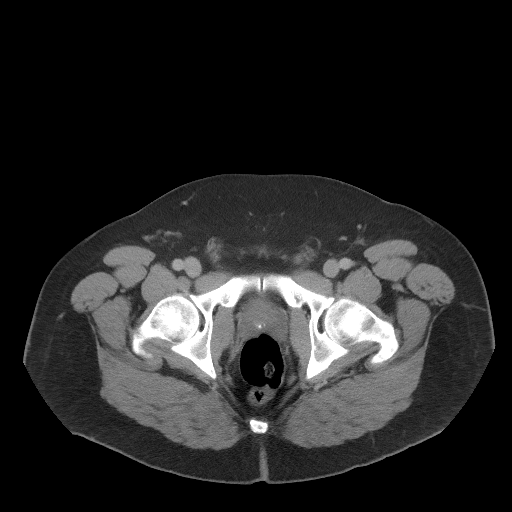
[im 42/116  soft-tissue]
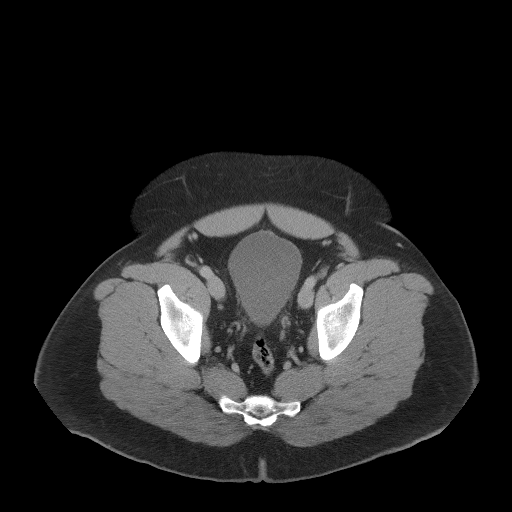
[im 51/116  soft-tissue]
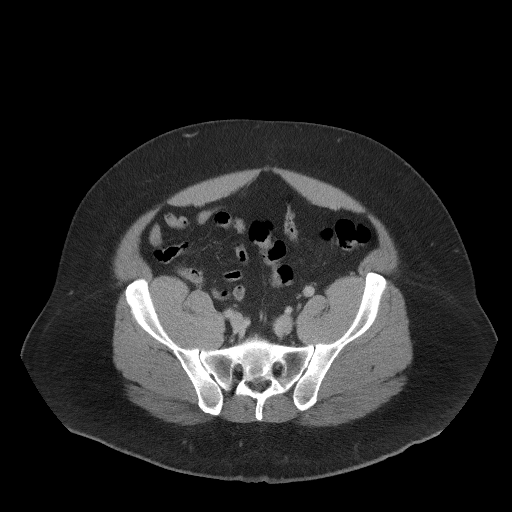
[im 60/116  soft-tissue]
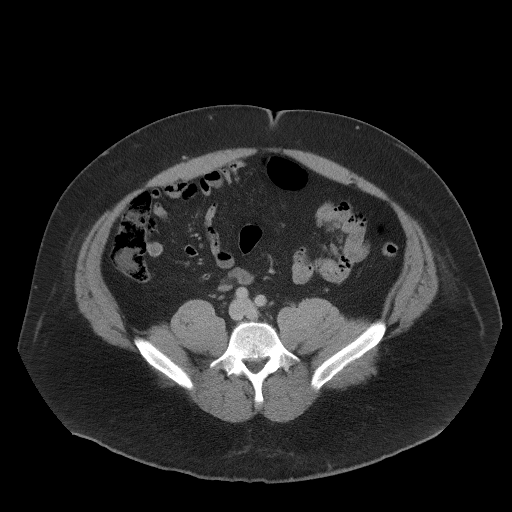
[im 65/116  soft-tissue]
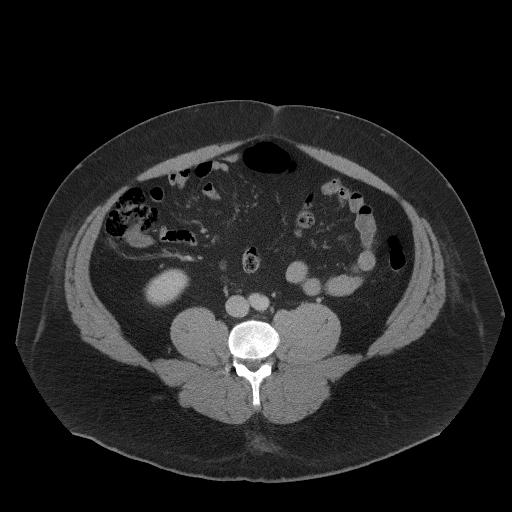
[im 74/116  soft-tissue]
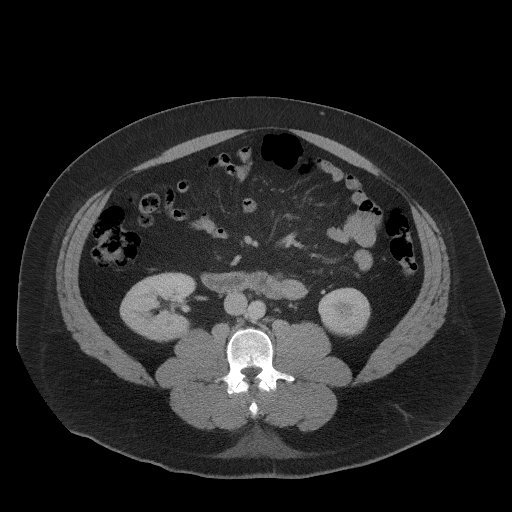
[im 74/116  bone]
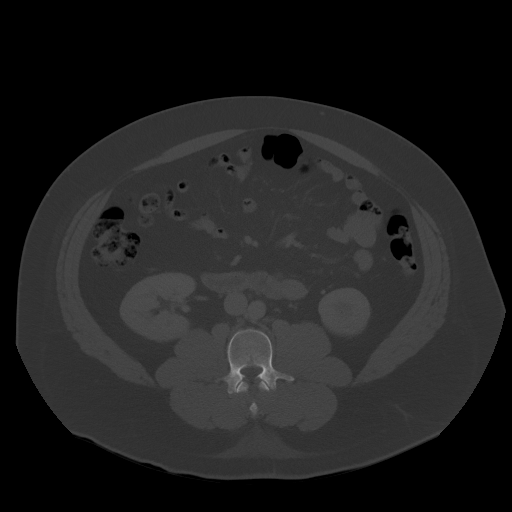
[im 83/116  soft-tissue]
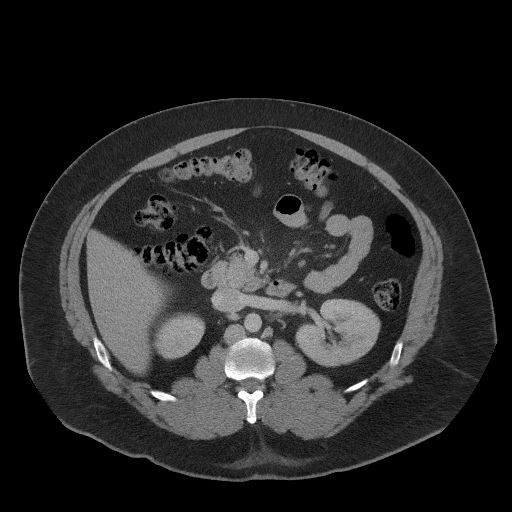
[im 93/116  soft-tissue]
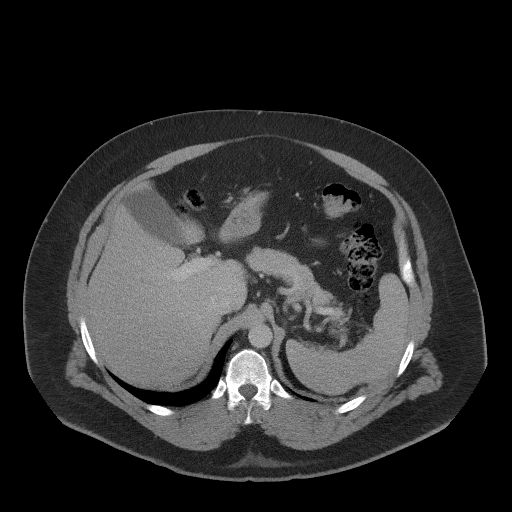
[im 102/116  soft-tissue]
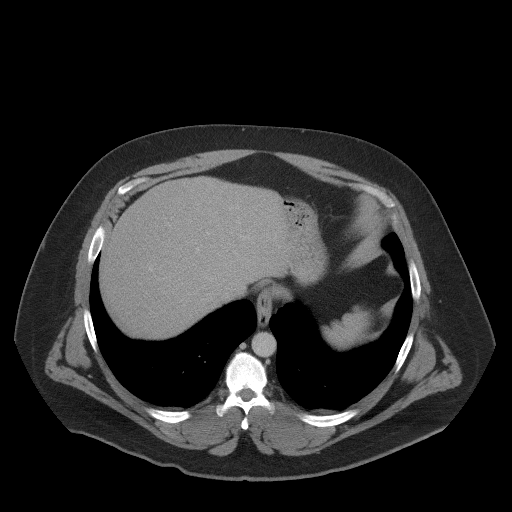
[im 111/116  soft-tissue]
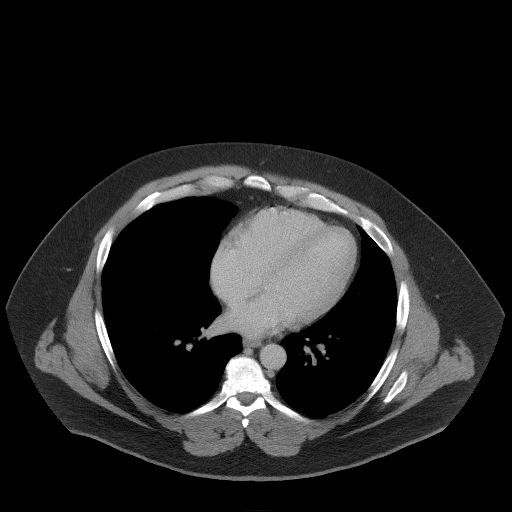

[Series 5: coronal st · coronal · 0.98mm/px · 3 of 117 slices shown]
[im 39/117  soft-tissue]
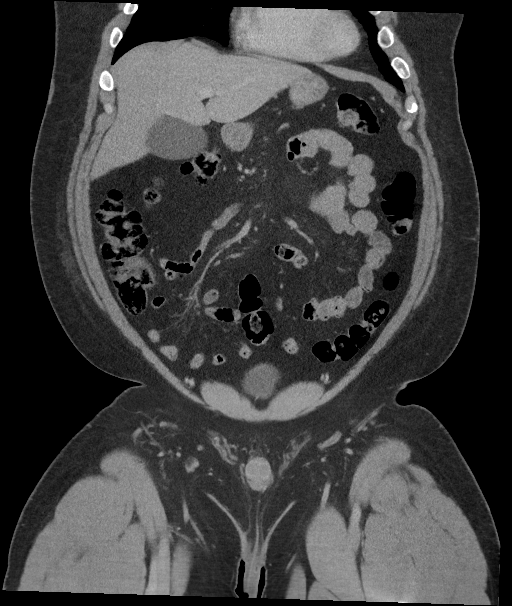
[im 52/117  soft-tissue]
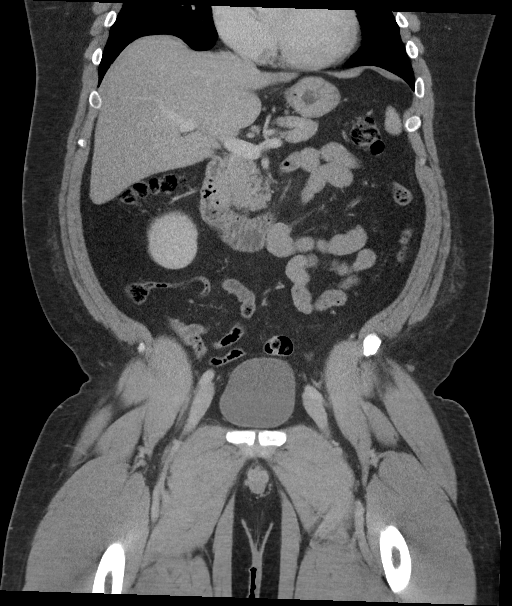
[im 65/117  soft-tissue]
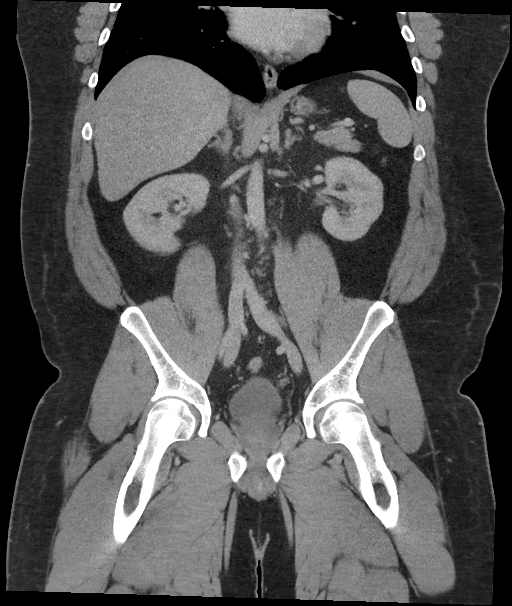

[16 of 46 positions shown; findings below may reference images not displayed]

FINDINGS: Lower chest: Clear lung bases.

Hepatobiliary: No focal liver abnormality is seen. Decreased hepatic
steatosis from prior exam. No gallstones, gallbladder wall
thickening, or biliary dilatation.

Pancreas: No ductal dilatation or inflammation.

Spleen: Normal in size without focal abnormality. Small splenule
posteriorly.

Adrenals/Urinary Tract: Adrenal glands are unremarkable. Kidneys are
normal, without renal calculi, focal lesion, or hydronephrosis.
Bladder is unremarkable.

Stomach/Bowel: Stomach is within normal limits. Appendix appears
normal. No evidence of bowel wall thickening, distention, or
inflammatory changes. Mild sigmoid colonic tortuosity.

Vascular/Lymphatic: Normal caliber abdominal aorta. Portal vein and
mesenteric vessels are patent. No adenopathy.

Reproductive: Prostate is unremarkable.

Other: No inguinal hernia with particular attention to the right.
Slight increased vascularity within the right inguinal canal
compared to left. No ascites or free air. Tiny fat containing
umbilical hernia.

Musculoskeletal: There are no acute or suspicious osseous
abnormalities.
IMPRESSION: 1. No inguinal hernia with particular attention to the right.
However there is mild increased vascularity within the right
inguinal canal compared to left, can be seen with early
inflammation, though no hyperemia is seen on scrotal ultrasound.
2. No acute findings in the abdomen or pelvis. Tiny fat containing
umbilical hernia.

## 2021-03-21 ENCOUNTER — Ambulatory Visit: Payer: 59 | Admitting: Podiatry

## 2021-03-28 ENCOUNTER — Ambulatory Visit (INDEPENDENT_AMBULATORY_CARE_PROVIDER_SITE_OTHER): Payer: 59 | Admitting: Podiatry

## 2021-03-28 ENCOUNTER — Encounter: Payer: Self-pay | Admitting: Podiatry

## 2021-03-28 ENCOUNTER — Other Ambulatory Visit: Payer: Self-pay

## 2021-03-28 DIAGNOSIS — M722 Plantar fascial fibromatosis: Secondary | ICD-10-CM

## 2021-03-28 DIAGNOSIS — M7732 Calcaneal spur, left foot: Secondary | ICD-10-CM | POA: Diagnosis not present

## 2021-03-28 MED ORDER — MELOXICAM 15 MG PO TABS: 15.0000 mg | ORAL_TABLET | Freq: Every day | ORAL | 3 refills | Status: DC

## 2021-03-28 MED ORDER — METHYLPREDNISOLONE 4 MG PO TBPK: ORAL_TABLET | ORAL | 0 refills | Status: DC

## 2021-03-28 NOTE — Progress Notes (Signed)
Subjective:  Patient ID: Caleb [name], male    DOB: November 08, 1980,  MRN: [id number]  Chief Complaint  Patient presents with  . Plantar Fasciitis    Left foot pain     40 y.o. male presents with the above complaint.  Patient presents with left Planter fasciitis/heel pain recurrence.  Patient states the pain is coming back and bottom of the foot.  He has been wearing his orthotics.  He states that the last injection helped considerably.  He would like to know if he can do regular he states that they gave him a good almost a year of relief.  He denies any other acute complaints he has not seen anyone else prior to seeing me.  He is known to Dr. Milinda Pointer.   Review of Systems: Negative except as noted in the HPI. Denies N/V/F/Ch.  Past Medical History:  Diagnosis Date  . Asthma 11/06/2019  . Chicken pox   . Hyperlipidemia   . Hypertension   . Nocturnal leg cramps 12/04/2017  . Obesity   . Obstructive sleep apnea     Current Outpatient Medications:  .  albuterol (VENTOLIN HFA) 108 (90 Base) MCG/ACT inhaler, Inhale 1-2 puffs into the lungs every 4 (four) hours as needed for wheezing or shortness of breath., Disp: 18 g, Rfl: 11 .  amLODipine (NORVASC) 2.5 MG tablet, Take 1 tablet (2.5 mg total) by mouth 2 (two) times daily., Disp: 180 tablet, Rfl: 3 .  ascorbic acid (VITAMIN C) 500 MG tablet, Take by mouth daily., Disp: , Rfl:  .  Cholecalciferol (VITAMIN D3 PO), Take by mouth. Takes 5000 units daily, Disp: , Rfl:  .  cyclobenzaprine (FLEXERIL) 5 MG tablet, Take 1 tablet (5 mg total) by mouth 3 (three) times daily as needed., Disp: 30 tablet, Rfl: 0 .  fluticasone furoate-vilanterol (BREO ELLIPTA) 200-25 MCG/INH AEPB, Inhale 1 puff into the lungs daily. Rinse mouth after use., Disp: 1 each, Rfl: 11 .  gabapentin (NEURONTIN) 100 MG capsule, Take 100 mg by mouth at bedtime as needed. , Disp: , Rfl:  .  meloxicam (MOBIC) 15 MG tablet, Take 1 tablet (15 mg total) by mouth daily., Disp: 30 tablet,  Rfl: 3 .  methylPREDNISolone (MEDROL DOSEPAK) 4 MG TBPK tablet, 6 day dose pack - take as directed, Disp: 21 tablet, Rfl: 0 .  Multiple Vitamins-Minerals (ZINC PO), Take by mouth daily., Disp: , Rfl:  .  multivitamin (ONE-A-DAY MEN'S) TABS tablet, Take 1 tablet by mouth daily., Disp: , Rfl:   Social History   Tobacco Use  Smoking Status Never Smoker  Smokeless Tobacco Former Systems developer  . Types: Chew    Allergies  Allergen Reactions  . Lisinopril     Cough   . Amoxicillin     Itching/crawling sensation     Objective:  There were no vitals filed for this visit. There is no height or weight on file to calculate BMI. Constitutional Well developed. Well nourished.  Vascular Dorsalis pedis pulses palpable bilaterally. Posterior tibial pulses palpable bilaterally. Capillary refill normal to all digits.  No cyanosis or clubbing noted. Pedal hair growth normal.  Neurologic Normal speech. Oriented to person, place, and time. Epicritic sensation to light touch grossly present bilaterally.  Dermatologic Nails well groomed and normal in appearance. No open wounds. No skin lesions.  Orthopedic: Normal joint ROM without pain or crepitus bilaterally. No visible deformities. Tender to palpation at the calcaneal tuber left. No pain with calcaneal squeeze left. Ankle ROM diminished range of motion  left. Silfverskiold Test: positive left.   Radiographs: Taken and reviewed. No acute fractures or dislocations. No evidence of stress fracture.  Plantar heel spur present. Posterior heel spur present.   Assessment:   1. Plantar fasciitis, left   2. Heel spur, left    Plan:  Patient was evaluated and treated and all questions answered.  Plantar Fasciitis, left - XR reviewed as above.  - Educated on icing and stretching. Instructions given.  - Injection delivered to the plantar fascia as below. - DME: Plantar Fascial Brace - Pharmacologic management: Medrol Dosepak and Mobic was  dispensed  Procedure: Injection Tendon/Ligament Location: Left plantar fascia at the glabrous junction; medial approach. Skin Prep: alcohol Injectate: 0.5 cc 0.5% marcaine plain, 0.5 cc of 1% Lidocaine, 0.5 cc kenalog 10. Disposition: Patient tolerated procedure well. Injection site dressed with a band-aid.  No follow-ups on file.

## 2021-04-11 ENCOUNTER — Telehealth: Payer: Self-pay | Admitting: Internal Medicine

## 2021-04-11 NOTE — Telephone Encounter (Signed)
Patient called complaining of left leg cramps. Patient did not know what to do. I scheduled a virtual with Flinchum on 04/13/21. Patient was concerned about the leg cramps. Patient kept the virutal appt but was sent to [facility] at [facility].

## 2021-04-11 NOTE — Telephone Encounter (Signed)
Has appointment in two days with michelle.

## 2021-04-12 NOTE — Telephone Encounter (Signed)
Patient's last physical was 04/20/2020. This has been scheduled correctly as he will be due for physical by then.

## 2021-04-13 ENCOUNTER — Telehealth: Payer: 59 | Admitting: Adult Health

## 2021-04-25 ENCOUNTER — Other Ambulatory Visit: Payer: Self-pay

## 2021-04-25 ENCOUNTER — Encounter: Payer: Self-pay | Admitting: Podiatry

## 2021-04-25 ENCOUNTER — Ambulatory Visit (INDEPENDENT_AMBULATORY_CARE_PROVIDER_SITE_OTHER): Payer: 59 | Admitting: Podiatry

## 2021-04-25 DIAGNOSIS — M722 Plantar fascial fibromatosis: Secondary | ICD-10-CM

## 2021-04-25 NOTE — Progress Notes (Signed)
Subjective:  Patient ID: Caleb [name], male    DOB: 1981-10-09,  MRN: [id number]  Chief Complaint  Patient presents with   Plantar Fasciitis    Left foot pain  PT stated that he is doing a lot better he stated that he does have some pain in one spot but feels like that is from the injection, he stated that the brace does help.  No new concerns at this time     40 y.o. male presents with the above complaint.  Patient presents for follow-up of Planter fasciitis.  Patient states is doing a lot better.  He does not have any pain.  He is about 100% improved.  He denies any other acute complaints he is scheduled get orthotics done.   Review of Systems: Negative except as noted in the HPI. Denies N/V/F/Ch.  Past Medical History:  Diagnosis Date   Asthma 11/06/2019   Chicken pox    Hyperlipidemia    Hypertension    Nocturnal leg cramps 12/04/2017   Obesity    Obstructive sleep apnea     Current Outpatient Medications:    albuterol (VENTOLIN HFA) 108 (90 Base) MCG/ACT inhaler, Inhale 1-2 puffs into the lungs every 4 (four) hours as needed for wheezing or shortness of breath., Disp: 18 g, Rfl: 11   amLODipine (NORVASC) 2.5 MG tablet, Take 1 tablet (2.5 mg total) by mouth 2 (two) times daily., Disp: 180 tablet, Rfl: 3   ascorbic acid (VITAMIN C) 500 MG tablet, Take by mouth daily., Disp: , Rfl:    Cholecalciferol (VITAMIN D3 PO), Take by mouth. Takes 5000 units daily, Disp: , Rfl:    cyclobenzaprine (FLEXERIL) 5 MG tablet, Take 1 tablet (5 mg total) by mouth 3 (three) times daily as needed., Disp: 30 tablet, Rfl: 0   fluticasone furoate-vilanterol (BREO ELLIPTA) 200-25 MCG/INH AEPB, Inhale 1 puff into the lungs daily. Rinse mouth after use., Disp: 1 each, Rfl: 11   gabapentin (NEURONTIN) 100 MG capsule, Take 100 mg by mouth at bedtime as needed. , Disp: , Rfl:    meloxicam (MOBIC) 15 MG tablet, Take 1 tablet (15 mg total) by mouth daily., Disp: 30 tablet, Rfl: 3   methylPREDNISolone (MEDROL  DOSEPAK) 4 MG TBPK tablet, 6 day dose pack - take as directed, Disp: 21 tablet, Rfl: 0   Multiple Vitamins-Minerals (ZINC PO), Take by mouth daily., Disp: , Rfl:    multivitamin (ONE-A-DAY MEN'S) TABS tablet, Take 1 tablet by mouth daily., Disp: , Rfl:   Social History   Tobacco Use  Smoking Status Never  Smokeless Tobacco Former   Types: Chew    Allergies  Allergen Reactions   Lisinopril     Cough    Amoxicillin     Itching/crawling sensation     Objective:  There were no vitals filed for this visit. There is no height or weight on file to calculate BMI. Constitutional Well developed. Well nourished.  Vascular Dorsalis pedis pulses palpable bilaterally. Posterior tibial pulses palpable bilaterally. Capillary refill normal to all digits.  No cyanosis or clubbing noted. Pedal hair growth normal.  Neurologic Normal speech. Oriented to person, place, and time. Epicritic sensation to light touch grossly present bilaterally.  Dermatologic Nails well groomed and normal in appearance. No open wounds. No skin lesions.  Orthopedic: Normal joint ROM without pain or crepitus bilaterally. No visible deformities. No further tender to palpation at the calcaneal tuber left. No pain with calcaneal squeeze left. Ankle ROM diminished range of motion left.  Silfverskiold Test: positive left.   Radiographs: Taken and reviewed. No acute fractures or dislocations. No evidence of stress fracture.  Plantar heel spur present. Posterior heel spur present.   Assessment:   1. Plantar fasciitis, left     Plan:  Patient was evaluated and treated and all questions answered.  Plantar Fasciitis, left -Clinically healed with a steroid injection.  I discussed shoe gear modification with him in extensive detail.  I also believe he will benefit from custom-made orthotics as well.  Pes planovalgus with underlying Planter fasciitis -I explained patient the etiology of pes planovalgus and various  treatment options were discussed.  Given that he has underlying plantar fasciitis I believe patient would benefit from custom-made orthotics to help control the hindfoot motion support the arches of her foot take the stress away from plantar fascial.  He states understanding like to proceed with orthotics -He is scheduled get orthotics casted for  No follow-ups on file.

## 2021-04-26 ENCOUNTER — Ambulatory Visit (INDEPENDENT_AMBULATORY_CARE_PROVIDER_SITE_OTHER): Payer: 59 | Admitting: Internal Medicine

## 2021-04-26 ENCOUNTER — Other Ambulatory Visit: Payer: Self-pay

## 2021-04-26 ENCOUNTER — Encounter: Payer: Self-pay | Admitting: Internal Medicine

## 2021-04-26 VITALS — BP 126/84 | HR 86 | Temp 98.4°F | Ht 66.61 in | Wt 292.4 lb

## 2021-04-26 DIAGNOSIS — Z1389 Encounter for screening for other disorder: Secondary | ICD-10-CM

## 2021-04-26 DIAGNOSIS — Z1283 Encounter for screening for malignant neoplasm of skin: Secondary | ICD-10-CM

## 2021-04-26 DIAGNOSIS — E291 Testicular hypofunction: Secondary | ICD-10-CM | POA: Diagnosis not present

## 2021-04-26 DIAGNOSIS — Z1329 Encounter for screening for other suspected endocrine disorder: Secondary | ICD-10-CM | POA: Diagnosis not present

## 2021-04-26 DIAGNOSIS — M51369 Other intervertebral disc degeneration, lumbar region without mention of lumbar back pain or lower extremity pain: Secondary | ICD-10-CM

## 2021-04-26 DIAGNOSIS — E559 Vitamin D deficiency, unspecified: Secondary | ICD-10-CM

## 2021-04-26 DIAGNOSIS — L918 Other hypertrophic disorders of the skin: Secondary | ICD-10-CM

## 2021-04-26 DIAGNOSIS — Z0000 Encounter for general adult medical examination without abnormal findings: Secondary | ICD-10-CM | POA: Diagnosis not present

## 2021-04-26 DIAGNOSIS — I10 Essential (primary) hypertension: Secondary | ICD-10-CM

## 2021-04-26 DIAGNOSIS — M5136 Other intervertebral disc degeneration, lumbar region: Secondary | ICD-10-CM

## 2021-04-26 DIAGNOSIS — M62838 Other muscle spasm: Secondary | ICD-10-CM

## 2021-04-26 DIAGNOSIS — J40 Bronchitis, not specified as acute or chronic: Secondary | ICD-10-CM

## 2021-04-26 DIAGNOSIS — J9801 Acute bronchospasm: Secondary | ICD-10-CM

## 2021-04-26 DIAGNOSIS — R739 Hyperglycemia, unspecified: Secondary | ICD-10-CM

## 2021-04-26 MED ORDER — AMLODIPINE BESYLATE 2.5 MG PO TABS: 2.5000 mg | ORAL_TABLET | Freq: Two times a day (BID) | ORAL | 3 refills | Status: DC

## 2021-04-26 MED ORDER — CYCLOBENZAPRINE HCL 5 MG PO TABS: 5.0000 mg | ORAL_TABLET | Freq: Two times a day (BID) | ORAL | 11 refills | Status: DC | PRN

## 2021-04-26 MED ORDER — ALBUTEROL SULFATE HFA 108 (90 BASE) MCG/ACT IN AERS: 1.0000 | INHALATION_SPRAY | RESPIRATORY_TRACT | 11 refills | Status: DC | PRN

## 2021-04-26 NOTE — Progress Notes (Signed)
Chief Complaint  Patient presents with   Annual Exam   Annual  1. Doing well has muscle spasm lower leg left x 3 times and 15 minutes lasting tried aleve chronic spasms MRI in the past mild DDD lumbar denies back pain  Neurology Kc wanted to do another lower ext NCS since 1st inconclusive but pt unsure of this  2.htn controlled overall on norvasc 2.5 qd to bid  Review of Systems  Constitutional:  Negative for weight loss.  HENT:  Negative for hearing loss.   Eyes:  Negative for blurred vision.  Respiratory:  Negative for shortness of breath.   Cardiovascular:  Negative for chest pain and claudication.  Gastrointestinal:  Negative for abdominal pain.  Musculoskeletal:  Negative for falls and joint pain.  Skin:  Negative for rash.  Neurological:  Negative for headaches.  Psychiatric/Behavioral:  Negative for depression.   Past Medical History:  Diagnosis Date   Asthma 11/06/2019   Chicken pox    Hyperlipidemia    Hypertension    Nocturnal leg cramps 12/04/2017   Obesity    Obstructive sleep apnea    Past Surgical History:  Procedure Laterality Date   WISDOM TOOTH EXTRACTION     Family History  Problem Relation Age of Onset   Hypertension Mother    Other Mother        fatty liver   Diabetes Father    Cancer Maternal Grandmother        colon, liver   Hypertension Maternal Grandmother    Other Maternal Grandmother        brain anerusym, mini stroke   Cancer Maternal Grandfather        prostate in 10s   Hypertension Maternal Grandfather    Anemia Paternal Grandmother    Cancer Paternal Grandfather        leukemia   Hypertension Maternal Aunt    Social History   Socioeconomic History   Marital status: Married    Spouse name: Not on file   Number of children: 1   Years of education: 12+   Highest education level: Not on file  Occupational History   Occupation: [employer]  Tobacco Use   Smoking status: Never   Smokeless tobacco: Former    Types: Horticulturist, commercial Use: Never used  Substance and Sexual Activity   Alcohol use: Yes    Alcohol/week: 2.0 standard drinks    Types: 2 Cans of beer per week    Comment: on occasion   Drug use: No   Sexual activity: Yes  Other Topics Concern   Not on file  Social History Narrative   Lives with wife, 1 son    04/2021 wife expecting another kid   Caffeine use: 1 cup coffee or 1 sugar free redbull   Works as Conservation officer, historic buildings x 16 years    Married    Left-handed   Social Determinants of Radio broadcast assistant Strain: Not on Art therapist Insecurity: Not on file  Transportation Needs: Not on file  Physical Activity: Not on file  Stress: Not on file  Social Connections: Not on file  Intimate Partner Violence: Not on file   Current Meds  Medication Sig   ascorbic acid (VITAMIN C) 500 MG tablet Take by mouth daily.   Cholecalciferol (VITAMIN D3 PO) Take by mouth. Takes 5000 units daily   Cyanocobalamin (B-12 PO) Take by mouth.   fluticasone furoate-vilanterol (BREO ELLIPTA) 200-25 MCG/INH  AEPB Inhale 1 puff into the lungs daily. Rinse mouth after use.   Multiple Vitamins-Minerals (ZINC PO) Take by mouth daily.   multivitamin (ONE-A-DAY MEN'S) TABS tablet Take 1 tablet by mouth daily.   QUERCETIN PO Take by mouth.   [DISCONTINUED] amLODipine (NORVASC) 2.5 MG tablet Take 1 tablet (2.5 mg total) by mouth 2 (two) times daily.   [DISCONTINUED] meloxicam (MOBIC) 15 MG tablet Take 1 tablet (15 mg total) by mouth daily.   Allergies  Allergen Reactions   Lisinopril     Cough    Amoxicillin     Itching/crawling sensation     No results found for this or any previous visit (from the past 2160 hour(s)). Objective  Body mass index is 46.33 kg/m. Wt Readings from Last 3 Encounters:  04/26/21 292 lb 6.4 oz (132.6 kg)  06/02/20 279 lb 15.8 oz (127 kg)  04/20/20 282 lb (127.9 kg)   Temp Readings from Last 3 Encounters:  04/26/21 98.4 F (36.9 C) (Oral)  06/02/20 98.2 F (36.8  C) (Oral)  04/20/20 98.2 F (36.8 C) (Oral)   BP Readings from Last 3 Encounters:  04/26/21 126/84  06/02/20 (!) 144/98  04/20/20 132/82   Pulse Readings from Last 3 Encounters:  04/26/21 86  06/02/20 98  04/20/20 89    Physical Exam Vitals and nursing note reviewed.  Constitutional:      Appearance: Normal appearance. He is well-developed and well-groomed. He is morbidly obese.  HENT:     Head: Normocephalic and atraumatic.  Eyes:     Conjunctiva/sclera: Conjunctivae normal.     Pupils: Pupils are equal, round, and reactive to light.  Cardiovascular:     Rate and Rhythm: Normal rate and regular rhythm.     Heart sounds: Normal heart sounds. No murmur heard. Pulmonary:     Effort: Pulmonary effort is normal.     Breath sounds: Normal breath sounds.  Abdominal:     Tenderness: There is no abdominal tenderness.  Skin:    General: Skin is warm and dry.  Neurological:     General: No focal deficit present.     Mental Status: He is alert and oriented to person, place, and time. Mental status is at baseline.     Gait: Gait normal.  Psychiatric:        Attention and Perception: Attention and perception normal.        Mood and Affect: Mood and affect normal.        Speech: Speech normal.        Behavior: Behavior normal. Behavior is cooperative.        Thought Content: Thought content normal.        Cognition and Memory: Cognition and memory normal.        Judgment: Judgment normal.    Assessment  Plan  Annual physical exam -  Had flu shot utd  covid vaccine declines for now rec MMR has not had yet  Hep A/B immune with h/o fatty liver  Colonoscopy screening 45-49 had at age 39s with polyps benign per pt    PSA normal 01/07/19 0.8  Skin tags referred dermatology  rec healthy diet and exercise he is working out 5 x per week and doing martial arts    Bronchitis/bronchospasm with suspected asthma - Plan: albuterol (VENTOLIN HFA) 108 (90 Base) MCG/ACT  inhaler flovent F/u pulm prn May need formal pfts in the future   Essential hypertension - Plan: amLODipine (NORVASC) 2.5 MG tablet qd to  bid controlled   Muscle spasm - Plan: cyclobenzaprine (FLEXERIL) 5 MG tablet Bid prn  Hylands leg supplement and theraworx   DDD (degenerative disc disease), lumbar W/o sx's  Skin tag - Plan: Ambulatory referral to Dermatology Skin cancer screening - Plan: Ambulatory referral to Dermatology   Provider: Dr. Olivia Mackie McLean-Scocuzza-Internal Medicine

## 2021-04-26 NOTE — Telephone Encounter (Signed)
Addressed at appt today

## 2021-04-27 LAB — COMPREHENSIVE METABOLIC PANEL
ALT: 33 IU/L (ref 0–44)
AST: 16 IU/L (ref 0–40)
Albumin/Globulin Ratio: 1.7 (ref 1.2–2.2)
Albumin: 4.2 g/dL (ref 4.0–5.0)
Alkaline Phosphatase: 101 IU/L (ref 44–121)
BUN/Creatinine Ratio: 16 (ref 9–20)
BUN: 12 mg/dL (ref 6–24)
Bilirubin Total: 0.4 mg/dL (ref 0.0–1.2)
CO2: 22 mmol/L (ref 20–29)
Calcium: 9.1 mg/dL (ref 8.7–10.2)
Chloride: 99 mmol/L (ref 96–106)
Creatinine, Ser: 0.76 mg/dL (ref 0.76–1.27)
Globulin, Total: 2.5 g/dL (ref 1.5–4.5)
Glucose: 84 mg/dL (ref 65–99)
Potassium: 4.5 mmol/L (ref 3.5–5.2)
Sodium: 139 mmol/L (ref 134–144)
Total Protein: 6.7 g/dL (ref 6.0–8.5)
eGFR: 116 mL/min/{1.73_m2} (ref >59)

## 2021-04-27 LAB — CBC WITH DIFFERENTIAL/PLATELET
Basophils Absolute: 0.1 10*3/uL (ref 0.0–0.2)
Basos: 1 %
EOS (ABSOLUTE): 0.2 10*3/uL (ref 0.0–0.4)
Eos: 2 %
Hematocrit: 46.4 % (ref 37.5–51.0)
Hemoglobin: 16.2 g/dL (ref 13.0–17.7)
Immature Grans (Abs): 0 10*3/uL (ref 0.0–0.1)
Immature Granulocytes: 0 %
Lymphocytes Absolute: 2 10*3/uL (ref 0.7–3.1)
Lymphs: 25 %
MCH: 31.3 pg (ref 26.6–33.0)
MCHC: 34.9 g/dL (ref 31.5–35.7)
MCV: 90 fL (ref 79–97)
Monocytes Absolute: 0.5 10*3/uL (ref 0.1–0.9)
Monocytes: 7 %
Neutrophils Absolute: 5.1 10*3/uL (ref 1.4–7.0)
Neutrophils: 65 %
Platelets: 267 10*3/uL (ref 150–450)
RBC: 5.18 x10E6/uL (ref 4.14–5.80)
RDW: 12.1 % (ref 11.6–15.4)
WBC: 7.8 10*3/uL (ref 3.4–10.8)

## 2021-04-27 LAB — LIPID PANEL
Chol/HDL Ratio: 4.6 ratio (ref 0.0–5.0)
Cholesterol, Total: 209 mg/dL — ABNORMAL HIGH (ref 100–199)
HDL: 45 mg/dL (ref >39)
LDL Chol Calc (NIH): 146 mg/dL — ABNORMAL HIGH (ref 0–99)
Triglycerides: 102 mg/dL (ref 0–149)
VLDL Cholesterol Cal: 18 mg/dL (ref 5–40)

## 2021-04-27 LAB — TESTOSTERONE: Testosterone: 343 ng/dL (ref 264–916)

## 2021-04-27 LAB — TSH: TSH: 2.38 u[IU]/mL (ref 0.450–4.500)

## 2021-04-27 LAB — VITAMIN D 25 HYDROXY (VIT D DEFICIENCY, FRACTURES): Vit D, 25-Hydroxy: 40.9 ng/mL (ref 30.0–100.0)

## 2021-04-27 LAB — HEMOGLOBIN A1C
Est. average glucose Bld gHb Est-mCnc: 108 mg/dL
Hgb A1c MFr Bld: 5.4 % (ref 4.8–5.6)

## 2021-04-29 LAB — URINALYSIS, ROUTINE W REFLEX MICROSCOPIC

## 2021-05-03 ENCOUNTER — Ambulatory Visit: Payer: 59

## 2021-05-03 ENCOUNTER — Other Ambulatory Visit: Payer: Self-pay | Admitting: Internal Medicine

## 2021-05-03 ENCOUNTER — Other Ambulatory Visit: Payer: Self-pay

## 2021-05-03 DIAGNOSIS — E785 Hyperlipidemia, unspecified: Secondary | ICD-10-CM

## 2021-05-03 DIAGNOSIS — M722 Plantar fascial fibromatosis: Secondary | ICD-10-CM

## 2021-05-03 DIAGNOSIS — M7732 Calcaneal spur, left foot: Secondary | ICD-10-CM

## 2021-05-03 DIAGNOSIS — Z1389 Encounter for screening for other disorder: Secondary | ICD-10-CM

## 2021-05-03 MED ORDER — PRAVASTATIN SODIUM 20 MG PO TABS: 20.0000 mg | ORAL_TABLET | Freq: Every day | ORAL | 3 refills | Status: DC

## 2021-05-03 NOTE — Addendum Note (Signed)
Addended by: Orland Mustard on: 05/03/2021 11:50 AM   Modules accepted: Orders

## 2021-05-03 NOTE — Addendum Note (Signed)
Addended by: Orland Mustard on: 05/03/2021 11:52 AM   Modules accepted: Orders

## 2021-06-06 NOTE — Progress Notes (Signed)
Caleb Bogden T. Leialoha Hanna, MD, [place] at [facility], [address]  Phone: [phone]  FAX: Bert[place] - 40 y.o. male  MRN [id number]  Date of Birth: [birth year]  Date: 06/07/2021  PCP: McLean-Scocuzza, Nino Glow, MD  Referral: McLean-Scocuzza, Olivia Mackie *  Chief Complaint  Patient presents with   Testicle Pain    Right   Groin Pain    Right    This visit occurred during the SARS-CoV-2 public health emergency.  Safety protocols were in place, including screening questions prior to the visit, additional usage of staff PPE, and extensive cleaning of exam room while observing appropriate contact time as indicated for disinfecting solutions.   Subjective:   Caleb [name] is a 40 y.o. very pleasant male patient with Body mass index is 43.98 kg/m. who presents with the following:  He is here to talk about whether or not he might have a hernia.  On further questioning he is having right-sided testicular pain that has been present for about 4 to 5 days.  He has pain on the right side as well as in the inguinal region.  He does not have any additional abdominal pain.  He has not had any dysuria or changes in his bowel habits, but he has been urinating more than normally.  About two years ago, had some testicular pain.  Urology did not say all that much.    Recently had a coughing fit, and did a lot of martial arts.  He thought he felt something in his testicular region.  Review of Systems is noted in the HPI, as appropriate  Objective:   BP (!) 130/96   Pulse (!) 121   Temp 98.2 F (36.8 C) (Temporal)   Ht '5\' 8"'$  (1.727 m)   Wt 289 lb 4 oz (131.2 kg)   SpO2 96%   BMI 43.98 kg/m   GEN: No acute distress; alert,appropriate. PULM: Breathing comfortably in no respiratory distress PSYCH: Normally interactive.   GU: Normal phallus.  The right testicle itself does appear to be more swollen compared to  the right.  He does have some global tenderness in this region.  With palpation of the deep inguinal canal, I cannot feel a hernia in the inguinal ring.  There is somewhat of bulge with coughing just caudal to the testicle itself.  Laboratory and Imaging Data:  Assessment and Plan:     ICD-10-CM   1. Right testicular pain  N50.811 US SCROTUM W/DOPPLER    2. Epididymitis  N45.1 levofloxacin (LEVAQUIN) 500 MG tablet    US SCROTUM W/DOPPLER     He does have pain in the testicle itself, and hopefully this is simply a case of epididymitis.  With acute testicular pain, testicular torsion or strangulated inguinal hernia needs to be excluded.  Obtain ultrasound with Doppler to evaluate for these to detect if emergent intervention needed.  I am going to place him on Levaquin for presumed epididymitis.  He has been happily married for over 20 years and has no suspicion for STDs.  Meds ordered this encounter  Medications   levofloxacin (LEVAQUIN) 500 MG tablet    Sig: Take 1 tablet (500 mg total) by mouth daily.    Dispense:  10 tablet    Refill:  0   There are no discontinued medications. Orders Placed This Encounter  Procedures   US SCROTUM W/DOPPLER    Follow-up: No follow-ups on file.  Dragon [employer] One speech-to-text software was used for transcription in this dictation.  Possible transcriptional errors can occur using Editor, commissioning.   Signed,  Maud Deed. Heiley Shaikh, MD   Outpatient Encounter Medications as of 06/07/2021  Medication Sig   albuterol (VENTOLIN HFA) 108 (90 Base) MCG/ACT inhaler Inhale 1-2 puffs into the lungs every 4 (four) hours as needed for wheezing or shortness of breath.   amLODipine (NORVASC) 2.5 MG tablet Take 1 tablet (2.5 mg total) by mouth 2 (two) times daily.   ascorbic acid (VITAMIN C) 500 MG tablet Take by mouth daily.   Cholecalciferol (VITAMIN D3 PO) Take by mouth. Takes 5000 units daily   Cyanocobalamin (B-12 PO) Take by mouth.    cyclobenzaprine (FLEXERIL) 5 MG tablet Take 1 tablet (5 mg total) by mouth 2 (two) times daily as needed.   fluticasone furoate-vilanterol (BREO ELLIPTA) 200-25 MCG/INH AEPB Inhale 1 puff into the lungs daily. Rinse mouth after use.   levofloxacin (LEVAQUIN) 500 MG tablet Take 1 tablet (500 mg total) by mouth daily.   Multiple Vitamins-Minerals (ZINC PO) Take by mouth daily.   multivitamin (ONE-A-DAY MEN'S) TABS tablet Take 1 tablet by mouth daily.   pravastatin (PRAVACHOL) 20 MG tablet Take 1 tablet (20 mg total) by mouth at bedtime.   QUERCETIN PO Take by mouth.   No facility-administered encounter medications on file as of 06/07/2021.

## 2021-06-07 ENCOUNTER — Ambulatory Visit (INDEPENDENT_AMBULATORY_CARE_PROVIDER_SITE_OTHER): Payer: 59 | Admitting: Family Medicine

## 2021-06-07 ENCOUNTER — Other Ambulatory Visit: Payer: Self-pay

## 2021-06-07 ENCOUNTER — Encounter: Payer: Self-pay | Admitting: Family Medicine

## 2021-06-07 VITALS — BP 130/96 | HR 121 | Temp 98.2°F | Ht 68.0 in | Wt 289.2 lb

## 2021-06-07 DIAGNOSIS — N50811 Right testicular pain: Secondary | ICD-10-CM | POA: Diagnosis not present

## 2021-06-07 DIAGNOSIS — N451 Epididymitis: Secondary | ICD-10-CM | POA: Diagnosis not present

## 2021-06-07 MED ORDER — LEVOFLOXACIN 500 MG PO TABS: 500.0000 mg | ORAL_TABLET | Freq: Every day | ORAL | 0 refills | Status: DC

## 2021-06-09 ENCOUNTER — Other Ambulatory Visit: Payer: Self-pay

## 2021-06-09 ENCOUNTER — Telehealth: Payer: Self-pay

## 2021-06-09 ENCOUNTER — Ambulatory Visit
Admission: RE | Admit: 2021-06-09 | Discharge: 2021-06-09 | Disposition: A | Discharge status: Home / Self Care | Payer: 59 | Source: Ambulatory Visit | Attending: Family Medicine | Admitting: Family Medicine

## 2021-06-09 DIAGNOSIS — N50811 Right testicular pain: Secondary | ICD-10-CM | POA: Insufficient documentation

## 2021-06-09 DIAGNOSIS — N451 Epididymitis: Secondary | ICD-10-CM | POA: Diagnosis not present

## 2021-06-09 NOTE — Telephone Encounter (Signed)
Kim with [facility] [place] said called report for US scrotum with doppler is in [place]; Maudie Mercury said pt is not waiting and the impression had nothing emergent. Dr Lorelei Pont is out of office but will send note to DR Copland and Butch Penny CMA.

## 2021-06-11 NOTE — Telephone Encounter (Signed)
See result note.  

## 2021-06-15 ENCOUNTER — Ambulatory Visit (INDEPENDENT_AMBULATORY_CARE_PROVIDER_SITE_OTHER): Payer: 59

## 2021-06-15 ENCOUNTER — Other Ambulatory Visit: Payer: Self-pay

## 2021-06-15 DIAGNOSIS — M722 Plantar fascial fibromatosis: Secondary | ICD-10-CM

## 2021-06-15 NOTE — Patient Instructions (Signed)

## 2021-06-15 NOTE — Progress Notes (Signed)
Patient presents for orthotic pick up.  Verbal and written break in and wear instructions given.  Patient will follow up in 4 weeks with Dr if symptoms worsen or fail to improve. 

## 2021-06-18 ENCOUNTER — Other Ambulatory Visit: Payer: Self-pay | Admitting: Family Medicine

## 2021-06-18 DIAGNOSIS — N50811 Right testicular pain: Secondary | ICD-10-CM

## 2021-06-18 DIAGNOSIS — R1030 Lower abdominal pain, unspecified: Secondary | ICD-10-CM

## 2021-06-18 DIAGNOSIS — R1909 Other intra-abdominal and pelvic swelling, mass and lump: Secondary | ICD-10-CM

## 2021-06-22 NOTE — Progress Notes (Signed)
Patient presents today to be casted for custom molded orthotics. Dr. Posey Pronto has been treating patient for plantar fasciitis.   Impression foam cast was taken.   Patient will be notified once orthotics arrive in office and reappoint for fitting at that time.

## 2021-07-13 ENCOUNTER — Ambulatory Visit: Payer: 59 | Admitting: Podiatry

## 2021-07-13 IMAGING — CR CHEST - 2 VIEW
1 series · 2 of 2 positions shown · non-contrast
Comparison: 11/29/2006

CLINICAL DATA: Cough, shortness of breath, left rib pain

EXAM:
CHEST - 2 VIEW; LEFT RIBS - 2 VIEW

[Series 1: dg chest 2 view · 0.14mm/px · 2 of 2 slices shown]
[im 1/2]
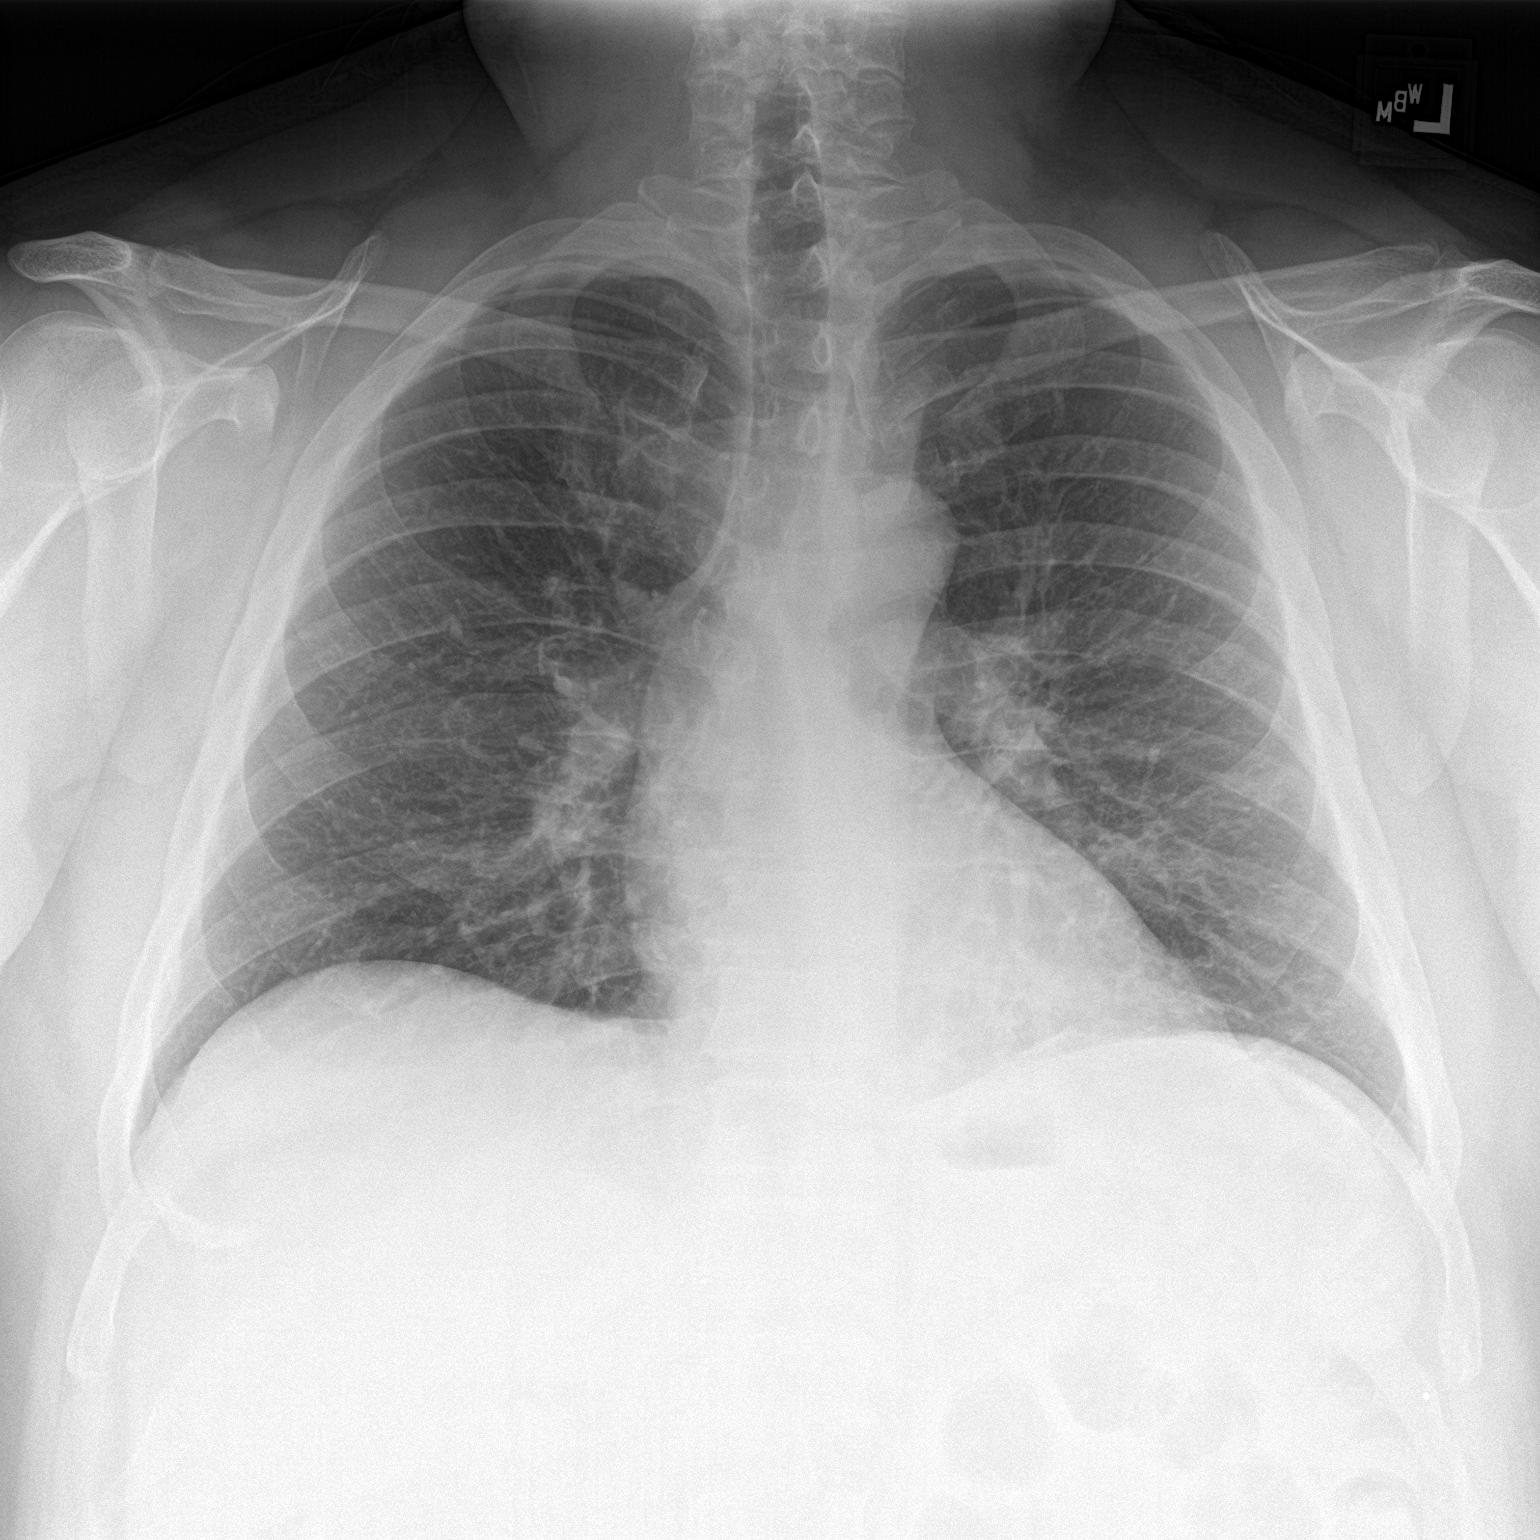
[im 2/2]
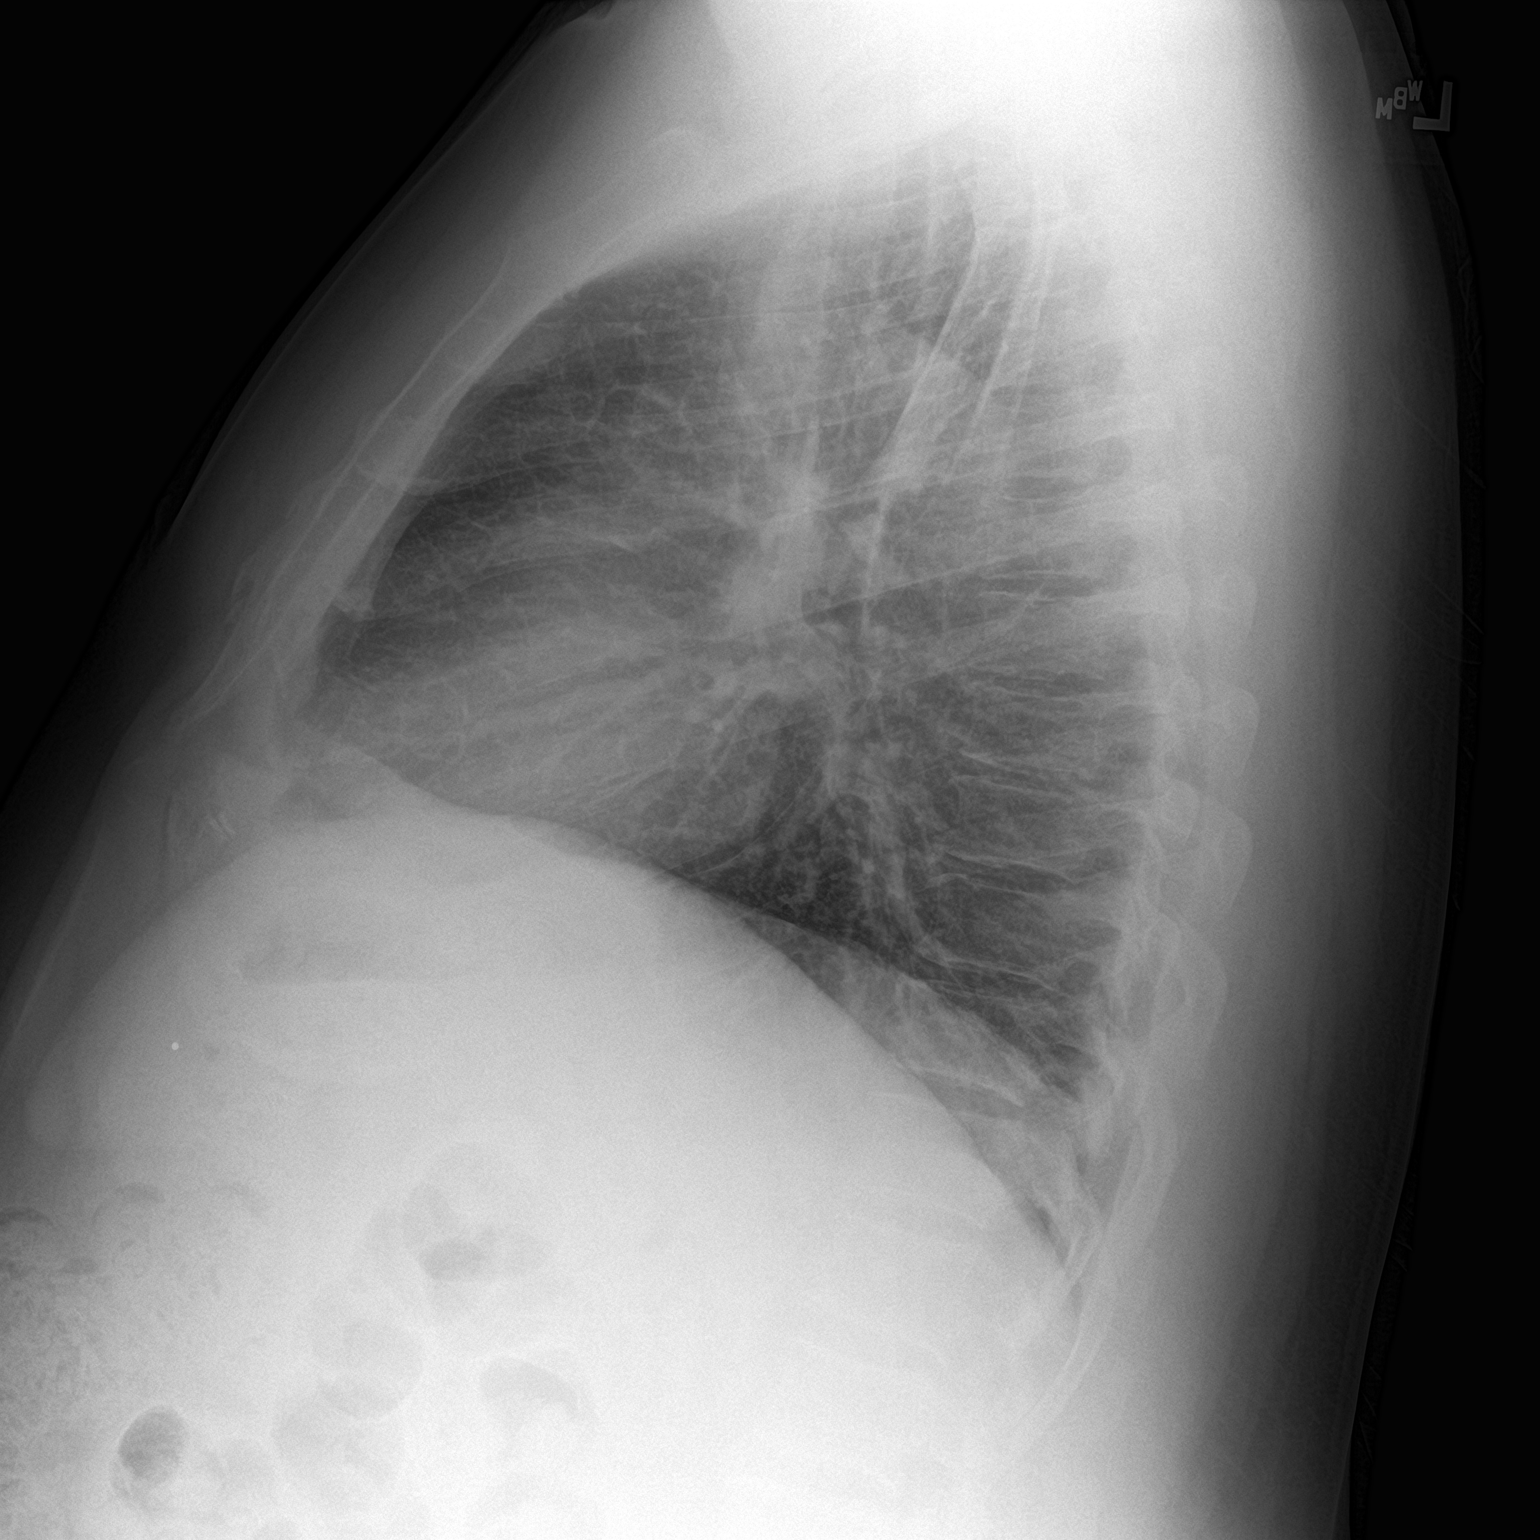

[2 of 2 positions shown; findings below may reference images not displayed]

FINDINGS: The heart size and mediastinal contours are within normal limits.
Both lungs are clear. The visualized skeletal structures are
unremarkable. No displaced rib fracture or other abnormality to
explain pain.
IMPRESSION: 1.  No acute abnormality of the lungs.  No focal airspace opacity.

2. No displaced rib fracture or other radiographic abnormality of
the left ribs to explain pain.

## 2021-07-13 IMAGING — CR LEFT RIBS - 2 VIEW
1 series · 4 of 4 positions shown · non-contrast
Comparison: 11/29/2006

CLINICAL DATA: Cough, shortness of breath, left rib pain

EXAM:
CHEST - 2 VIEW; LEFT RIBS - 2 VIEW

[Series 1: dg ribs unilateral left · 0.14mm/px · 4 of 4 slices shown]
[im 1/4]
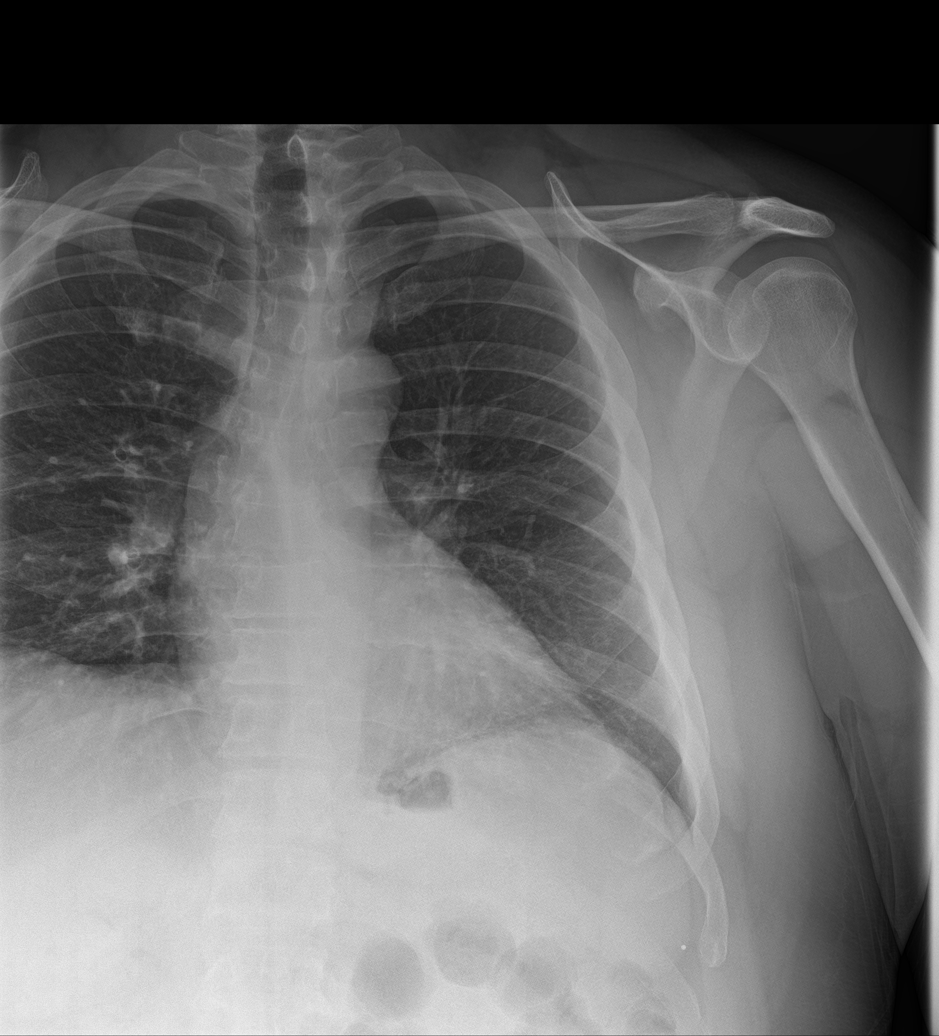
[im 2/4]
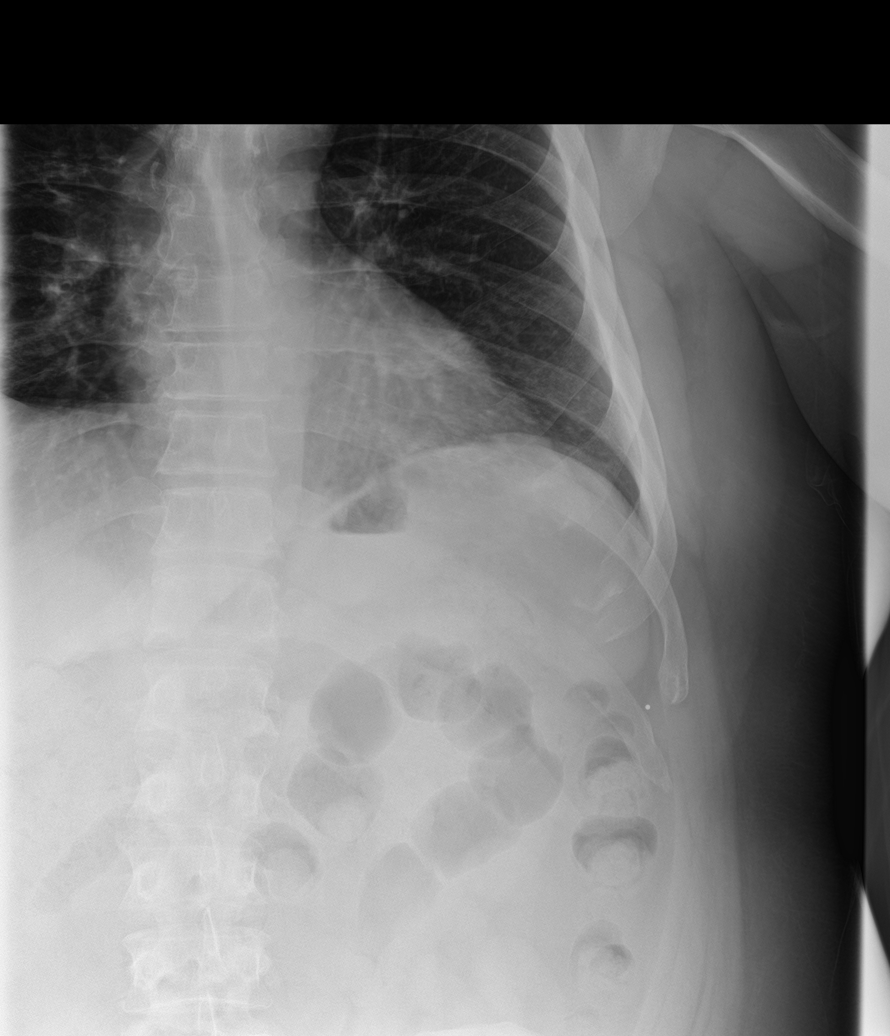
[im 3/4]
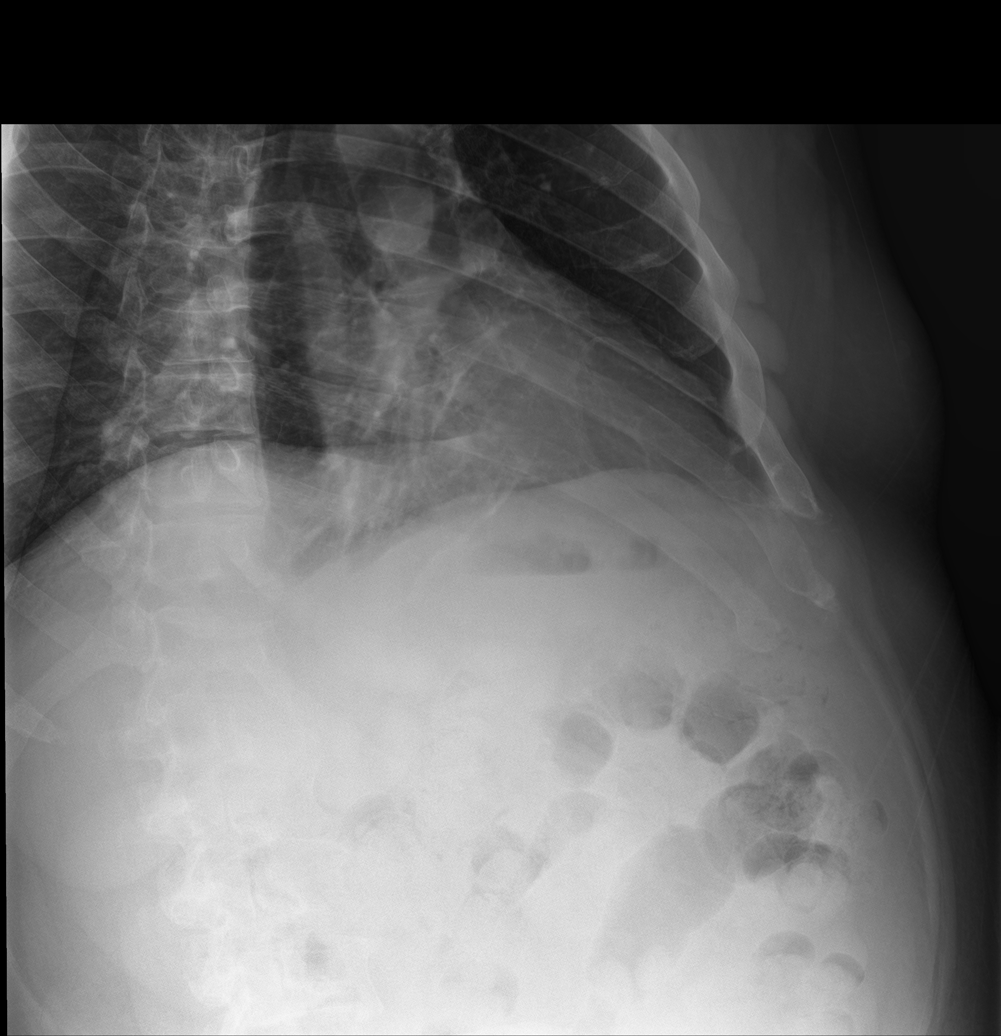
[im 4/4]
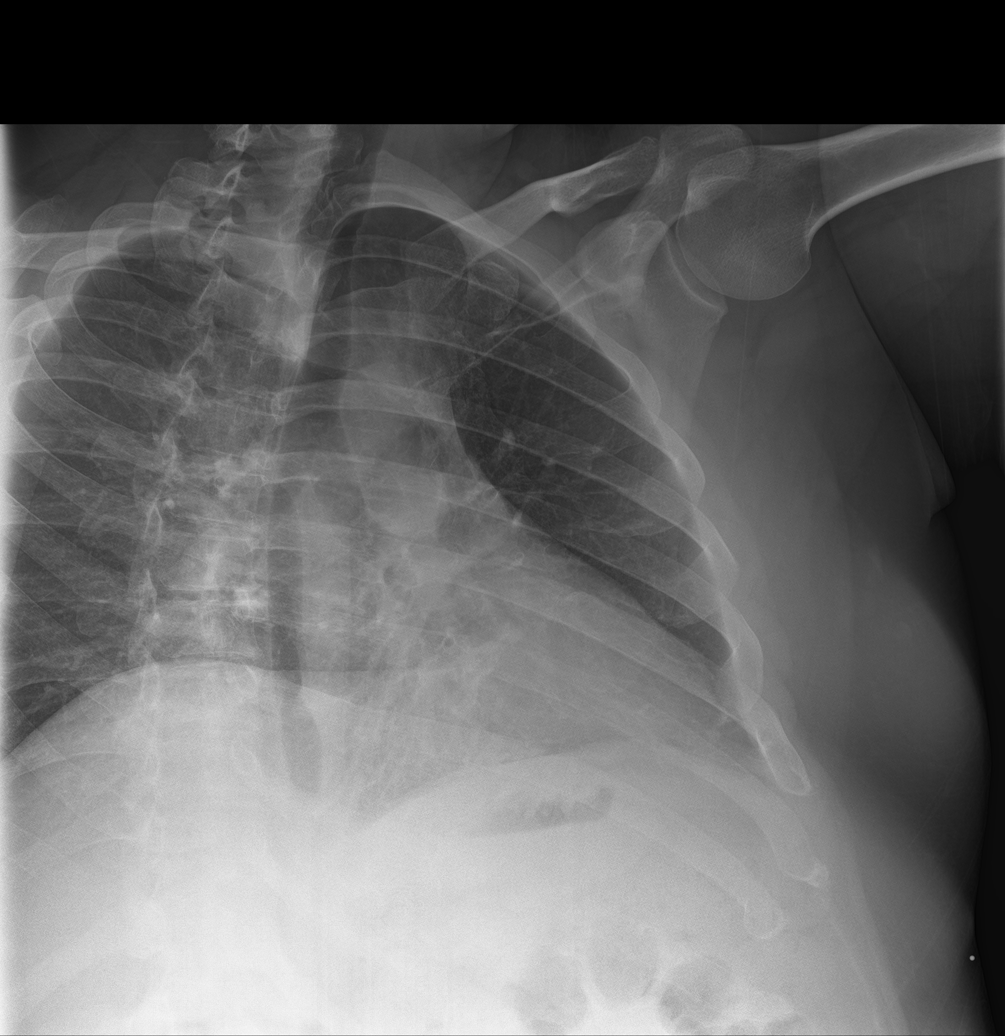

[4 of 4 positions shown; findings below may reference images not displayed]

FINDINGS: The heart size and mediastinal contours are within normal limits.
Both lungs are clear. The visualized skeletal structures are
unremarkable. No displaced rib fracture or other abnormality to
explain pain.
IMPRESSION: 1.  No acute abnormality of the lungs.  No focal airspace opacity.

2. No displaced rib fracture or other radiographic abnormality of
the left ribs to explain pain.

## 2021-07-28 ENCOUNTER — Other Ambulatory Visit: Payer: Self-pay

## 2021-07-28 ENCOUNTER — Ambulatory Visit
Admission: RE | Admit: 2021-07-28 | Discharge: 2021-07-28 | Disposition: A | Discharge status: Home / Self Care | Payer: 59 | Source: Ambulatory Visit | Attending: Family Medicine | Admitting: Family Medicine

## 2021-07-28 DIAGNOSIS — R1909 Other intra-abdominal and pelvic swelling, mass and lump: Secondary | ICD-10-CM | POA: Diagnosis present

## 2021-07-28 DIAGNOSIS — N50811 Right testicular pain: Secondary | ICD-10-CM | POA: Diagnosis present

## 2021-07-28 DIAGNOSIS — R1030 Lower abdominal pain, unspecified: Secondary | ICD-10-CM | POA: Diagnosis present

## 2021-07-28 LAB — POCT I-STAT CREATININE: Creatinine, Ser: 0.9 mg/dL (ref 0.61–1.24)

## 2021-07-28 MED ORDER — IOHEXOL 350 MG/ML SOLN
100.0000 mL | Freq: Once | INTRAVENOUS | Status: AC | PRN
  Administered 2021-07-28: 100 mL via INTRAVENOUS

## 2021-11-14 ENCOUNTER — Other Ambulatory Visit: Payer: Self-pay

## 2021-11-14 ENCOUNTER — Ambulatory Visit (INDEPENDENT_AMBULATORY_CARE_PROVIDER_SITE_OTHER): Payer: 59 | Admitting: Dermatology

## 2021-11-14 DIAGNOSIS — L739 Follicular disorder, unspecified: Secondary | ICD-10-CM

## 2021-11-14 DIAGNOSIS — L821 Other seborrheic keratosis: Secondary | ICD-10-CM

## 2021-11-14 DIAGNOSIS — Z1283 Encounter for screening for malignant neoplasm of skin: Secondary | ICD-10-CM | POA: Diagnosis not present

## 2021-11-14 DIAGNOSIS — L918 Other hypertrophic disorders of the skin: Secondary | ICD-10-CM

## 2021-11-14 DIAGNOSIS — L814 Other melanin hyperpigmentation: Secondary | ICD-10-CM

## 2021-11-14 DIAGNOSIS — L578 Other skin changes due to chronic exposure to nonionizing radiation: Secondary | ICD-10-CM | POA: Diagnosis not present

## 2021-11-14 DIAGNOSIS — L738 Other specified follicular disorders: Secondary | ICD-10-CM

## 2021-11-14 DIAGNOSIS — D229 Melanocytic nevi, unspecified: Secondary | ICD-10-CM

## 2021-11-14 DIAGNOSIS — D1800 Hemangioma unspecified site: Secondary | ICD-10-CM

## 2021-11-14 MED ORDER — CLINDAMYCIN PHOSPHATE 1 % EX FOAM: 1.0000 "application " | Freq: Every day | CUTANEOUS | 6 refills | Status: DC

## 2021-11-14 NOTE — Progress Notes (Signed)
New Patient Visit  Subjective  Caleb [name] is a 41 y.o. male who presents for the following: New Patient (Initial Visit) (Patient here today for tbse. Patient reports history of folliculitis at groin and would like to discuss. Patient denies family or personal history of skin cancer. Patient would also like to discuss skin tags at left axilla and right axilla. ). He would like them removed if possible.  The patient presents for Total-Body Skin Exam (TBSE) for skin cancer screening and mole check.  The patient has spots, moles and lesions to be evaluated, some may be new or changing and the patient has concerns that these could be cancer.   The following portions of the chart were reviewed this encounter and updated as appropriate:       Review of Systems:  No other skin or systemic complaints except as noted in HPI or Assessment and Plan.  Objective  Well appearing patient in no apparent distress; mood and affect are within normal limits.  A full examination was performed including scalp, head, eyes, ears, nose, lips, neck, chest, axillae, abdomen, back, buttocks, bilateral upper extremities, bilateral lower extremities, hands, feet, fingers, toes, fingernails, and toenails. All findings within normal limits unless otherwise noted below.  BL medial thighs Small pink follicular papules at medial thighs   bilateral axilla Fleshy, skin-colored pedunculated papules x 2      Assessment & Plan  Folliculitis BL medial thighs  Chronic condition  Start clindamycin phosphate foam apply topically daily to affect areas after shower.   Over the counter panoxyl 4 % creamy wash - use daily in shower, let sit several minutes prior to rinsing Benzoyl peroxide can cause dryness and irritation of the skin. It can also bleach fabric.   Can also try CLN acne/folliculitis cleanser, non-bleaching  Clindamycin Phosphate foam - BL medial thighs Apply 1 application topically daily. Apply  after shower to affected areas  Acrochordon bilateral axilla  Patient would like skin tags removed, cosmetic charge $115  Procedure: Skin tag removal Informed consent:  Discussed risks (permanent scarring, infection, pain, bleeding, bruising, redness, and recurrence of the lesion) and benefits of the procedure, as well as the alternatives.  Patient is aware that skin tags are benign lesions, and their removal is often not considered medically necessary.  Informed consent was obtained. The area was prepared with isopropyl alcohol. Anesthesia:  lidocaine 1% with epinephrine was injected to achieve good local anesthesia Snip removal was performed x 2.   Antibiotic ointment and a sterile dressing were applied.   The patient tolerated procedure well. The patient was instructed on post-op care.   Number of lesions removed:  1 at right and 1 at left axilla    Lentigines - Scattered tan macules - Due to sun exposure - Benign-appearing, observe - Recommend daily broad spectrum sunscreen SPF 30+ to sun-exposed areas, reapply every 2 hours as needed. - Call for any changes  Sebaceous Hyperplasia - Small yellow papules with a central dell - Benign - Observe  Seborrheic Keratoses - Stuck-on, waxy, tan-brown papules and/or plaques  - Benign-appearing - Discussed benign etiology and prognosis. - Observe - Call for any changes   Melanocytic Nevi - Tan-brown and/or pink-flesh-colored symmetric macules and papules - Benign appearing on exam today - Observation - Call clinic for new or changing moles - Recommend daily use of broad spectrum spf 30+ sunscreen to sun-exposed areas.   Hemangiomas - Red papules - Discussed benign nature - Observe - Call for  any changes  Actinic Damage - Chronic condition, secondary to cumulative UV/sun exposure - diffuse erythematous macules with underlying dyspigmentation scalp, arms - Recommend daily broad spectrum sunscreen SPF 30+ to sun-exposed  areas, reapply every 2 hours as needed.  - Staying in the shade or wearing long sleeves, sun glasses (UVA+UVB protection) and wide brim hats (4-inch brim around the entire circumference of the hat) are also recommended for sun protection.  - Call for new or changing lesions.  Skin cancer screening performed today. Return if symptoms worsen or fail to improve. I, Ruthell Rummage, CMA, am acting as scribe for Brendolyn Patty, MD.  Documentation: I have reviewed the above documentation for accuracy and completeness, and I agree with the above.  Brendolyn Patty MD

## 2021-11-14 NOTE — Patient Instructions (Addendum)
For Folliculitis   Can purchase Panoxyl creamy body at drug store  Or CLN acne sport wash here or at drug store       Skin Tag Removal Wound Care Instructions  Sometimes bandages are not necessary.  If a bandage is used, leave the original bandage on for 24 hours if possible.  If the bandage becomes soaked or soiled before that time, it is OK to remove it and examine the wound.  A small amount of post-operative bleeding is normal.  If excessive bleeding occurs, remove the bandage, place gauze over the site and apply continuous pressure (no peeking) over the area for 20-30 minutes.  If this does not stop the bleeding, try again for 40 minutes.  If this does not work, please call our clinic as soon as possible (even if after hours).    Once a day, cleanse the wound with soap and water.  If a thick crust develops you may use a Q-tip dipped into dilute hydrogen peroxide (mix 1:1 with water) to dissolve it.  Hydrogen peroxide can slow the healing process, so use it only as needed.  After washing, apply Vaseline jelly or Polysporin ointment.  For best healing, the wound should be covered with a layer of ointment at all times.  This may mean re-applying the ointment several times a day.  For open wounds, continue until it has healed.    If you have any swelling, keep the area elevated.  Some redness, tenderness and white or yellow material in the wound is normal healing.  If the area becomes very sore and red, or develops a thick yellow-green material (pus), it may be infected; please notify us.    Wound healing continues for up to one year following surgery.  It is not unusual to experience pain in the scar from time to time during the interval.  If the pain becomes severe or the scar thickens, you should notify the office.  A slight amount of redness in a scar is expected for the first six months.  After six months, the redness subsides and the scar will soften and fade.  The color difference  becomes less noticeable with time.  If there are any problems, return for a post-op surgery check at your earliest convenience.  It is important to wear sunscreen daily with an SPF of 30 or higher over the treated sites and to wear sun-protective clothing.  Please call our office at [phone] for any questions or concerns.      Melanoma ABCDEs  Melanoma is the most dangerous type of skin cancer, and is the leading cause of death from skin disease.  You are more likely to develop melanoma if you: Have light-colored skin, light-colored eyes, or red or blond hair Spend a lot of time in the sun Tan regularly, either outdoors or in a tanning bed Have had blistering sunburns, especially during childhood Have a close family member who has had a melanoma Have atypical moles or large birthmarks  Early detection of melanoma is key since treatment is typically straightforward and cure rates are extremely high if we catch it early.   The first sign of melanoma is often a change in a mole or a new dark spot.  The ABCDE system is a way of remembering the signs of melanoma.  A for asymmetry:  The two halves do not match. B for border:  The edges of the growth are irregular. C for color:  A mixture of colors are  present instead of an even brown color. D for diameter:  Melanomas are usually (but not always) greater than 28mm - the size of a pencil eraser. E for evolution:  The spot keeps changing in size, shape, and color.  Please check your skin once per month between visits. You can use a small mirror in front and a large mirror behind you to keep an eye on the back side or your body.   If you see any new or changing lesions before your next follow-up, please call to schedule a visit.  Please continue daily skin protection including broad spectrum sunscreen SPF 30+ to sun-exposed areas, reapplying every 2 hours as needed when you're outdoors.   Staying in the shade or wearing long sleeves,  sun glasses (UVA+UVB protection) and wide brim hats (4-inch brim around the entire circumference of the hat) are also recommended for sun protection.     If You Need Anything After Your Visit  If you have any questions or concerns for your doctor, please call our main line at [phone] and press option 4 to reach your doctor's medical assistant. If no one answers, please leave a voicemail as directed and we will return your call as soon as possible. Messages left after 4 pm will be answered the following business day.   You may also send [place] a message via [place]. We typically respond to MyChart messages within 1-2 business days.  For prescription refills, please ask your pharmacy to contact our office. Our fax number is [phone].  If you have an urgent issue when the clinic is closed that cannot wait until the next business day, you can page your doctor at the number below.    Please note that while we do our best to be available for urgent issues outside of office hours, we are not available 24/7.   If you have an urgent issue and are unable to reach [place], you may choose to seek medical care at your doctor's office, retail clinic, urgent care center, or emergency room.  If you have a medical emergency, please immediately call 911 or go to the emergency department.  Pager Numbers  - Dr. Nehemiah Massed: [phone]  - Dr. Laurence Ferrari: [phone]  - Dr. Nicole Kindred: [phone]  In the event of inclement weather, please call our main line at [phone] for an update on the status of any delays or closures.  Dermatology Medication Tips: Please keep the boxes that topical medications come in in order to help keep track of the instructions about where and how to use these. Pharmacies typically print the medication instructions only on the boxes and not directly on the medication tubes.   If your medication is too expensive, please contact our office at [phone] option 4 or send [place] a message  through [place].   We are unable to tell what your co-pay for medications will be in advance as this is different depending on your insurance coverage. However, we may be able to find a substitute medication at lower cost or fill out paperwork to get insurance to cover a needed medication.   If a prior authorization is required to get your medication covered by your insurance company, please allow [place] 1-2 business days to complete this process.  Drug prices often vary depending on where the prescription is filled and some pharmacies may offer cheaper prices.  The website www.goodrx.com contains coupons for medications through different pharmacies. The prices here do not account for what the cost may be with  help from insurance (it may be cheaper with your insurance), but the website can give you the price if you did not use any insurance.  - You can print the associated coupon and take it with your prescription to the pharmacy.  - You may also stop by our office during regular business hours and pick up a GoodRx coupon card.  - If you need your prescription sent electronically to a different pharmacy, notify our office through [facility] or by phone at [phone] option 4.     Si Usted Necesita Algo Despus de Su Visita  Tambin puede enviarnos un mensaje a travs de Pharmacist, community. Por lo general respondemos a los mensajes de MyChart en el transcurso de 1 a 2 das hbiles.  Para renovar recetas, por favor pida a su farmacia que se ponga en contacto con nuestra oficina. Harland Dingwall de fax es [place] [phone].  Si tiene un asunto urgente cuando la clnica est cerrada y que no puede esperar hasta el siguiente da hbil, puede llamar/localizar a su doctor(a) al nmero que aparece a continuacin.   Por favor, tenga en cuenta que aunque hacemos todo lo posible para estar disponibles para asuntos urgentes fuera del horario de [place], no estamos disponibles las 24 horas del da, los 7 das de  la [place].   Si tiene un problema urgente y no puede comunicarse con nosotros, puede optar por buscar atencin mdica  en el consultorio de su doctor(a), en una clnica privada, en un centro de atencin urgente o en una sala de emergencias.  Si tiene Engineering geologist, por favor llame inmediatamente al 911 o vaya a la sala de emergencias.  Nmeros de bper  - Dr. Nehemiah Massed: [phone]  - Dra. Moye: [phone]  - Dra. Nicole Kindred: [phone]  En caso de inclemencias del [place], por favor llame a Johnsie Kindred principal al [phone] para una actualizacin sobre el [place] de cualquier retraso o cierre.  Consejos para la medicacin en dermatologa: Por favor, guarde las cajas en las que vienen los medicamentos de uso tpico para ayudarle a seguir las instrucciones sobre dnde y cmo usarlos. Las farmacias generalmente imprimen las instrucciones del medicamento slo en las cajas y no directamente en los tubos del [place].   Si su medicamento es muy caro, por favor, pngase en contacto con Zigmund Daniel llamando al [phone] y presione la opcin 4 o envenos un mensaje a travs de Pharmacist, community.   No podemos decirle cul ser su copago por los medicamentos por adelantado ya que esto es diferente dependiendo de la cobertura de su seguro. Sin embargo, es posible que podamos encontrar un medicamento sustituto a Electrical engineer un formulario para que el seguro cubra el medicamento que se considera necesario.   Si se requiere una autorizacin previa para que su compaa de seguros [place] su medicamento, por favor permtanos de 1 a 2 das hbiles para completar este proceso.  Los precios de los medicamentos varan con frecuencia dependiendo del Environmental consultant de dnde se surte la receta y alguna farmacias pueden ofrecer precios ms baratos.  El sitio web www.goodrx.com tiene cupones para medicamentos de Airline pilot. Los precios aqu no tienen en cuenta lo que podra costar con la ayuda  del seguro (puede ser ms barato con su seguro), pero el sitio web puede darle el precio si no utiliz Research scientist (physical sciences).  - Puede imprimir el cupn correspondiente y llevarlo con su receta a la farmacia.  - Tambin puede pasar por nuestra oficina durante el  horario de Freight forwarder regular y Charity fundraiser una tarjeta de cupones de GoodRx.  - Si necesita que su receta se enve electrnicamente a una farmacia diferente, informe a nuestra oficina a travs de MyChart de [place] o por telfono llamando al [phone] y presione la opcin 4.

## 2021-11-21 ENCOUNTER — Telehealth: Payer: Self-pay

## 2021-11-21 DIAGNOSIS — L739 Follicular disorder, unspecified: Secondary | ICD-10-CM

## 2021-11-21 MED ORDER — CLINDAMYCIN PHOSPHATE 1 % EX LOTN: TOPICAL_LOTION | CUTANEOUS | 6 refills | Status: AC

## 2021-11-21 NOTE — Telephone Encounter (Signed)
The Clindamycin Foam was denied by insurance with no alternatives given. Please advise.

## 2021-11-21 NOTE — Addendum Note (Signed)
Addended by: Harriett Sine on: 11/21/2021 04:27 PM   Modules accepted: Orders

## 2022-02-27 ENCOUNTER — Ambulatory Visit (INDEPENDENT_AMBULATORY_CARE_PROVIDER_SITE_OTHER): Payer: 59 | Admitting: Podiatry

## 2022-02-27 ENCOUNTER — Ambulatory Visit (INDEPENDENT_AMBULATORY_CARE_PROVIDER_SITE_OTHER): Payer: 59

## 2022-02-27 DIAGNOSIS — S93401A Sprain of unspecified ligament of right ankle, initial encounter: Secondary | ICD-10-CM

## 2022-02-27 NOTE — Progress Notes (Signed)
?Subjective:  ?Patient ID: Caleb [name], male    DOB: Sep 29, 1981,  MRN: [id number] ? ?No chief complaint on file. ? ? ?41 y.o. male presents with the above complaint.  Patient presents with new complaint of right ankle pain.  Patient states that he rolled his ankle on Saturday and has had a lot of pain since last night.  He states he has multiple history of rolling the ankle.  He has ankle instability as well.  He wanted to get this evaluated.  He cannot take time off of work.  He would like to discuss treatment options for this pain scale is 5 out of 10 it is improving.  Date of injury was 02/24/2022 ? ? ?Review of Systems: Negative except as noted in the HPI. Denies N/V/F/Ch. ? ?Past Medical History:  ?Diagnosis Date  ? Asthma 11/06/2019  ? Chicken pox   ? Hyperlipidemia   ? Hypertension   ? Nocturnal leg cramps 12/04/2017  ? Obesity   ? Obstructive sleep apnea   ? ? ?Current Outpatient Medications:  ?  albuterol (VENTOLIN HFA) 108 (90 Base) MCG/ACT inhaler, Inhale 1-2 puffs into the lungs every 4 (four) hours as needed for wheezing or shortness of breath., Disp: 18 g, Rfl: 11 ?  amLODipine (NORVASC) 2.5 MG tablet, Take 1 tablet (2.5 mg total) by mouth 2 (two) times daily., Disp: 180 tablet, Rfl: 3 ?  ascorbic acid (VITAMIN C) 500 MG tablet, Take by mouth daily., Disp: , Rfl:  ?  Cholecalciferol (VITAMIN D3 PO), Take by mouth. Takes 5000 units daily, Disp: , Rfl:  ?  clindamycin (CLEOCIN-T) 1 % lotion, Apply 1 application topically daily. Apply after shower to affected areas., Disp: 60 mL, Rfl: 6 ?  Cyanocobalamin (B-12 PO), Take by mouth., Disp: , Rfl:  ?  cyclobenzaprine (FLEXERIL) 5 MG tablet, Take 1 tablet (5 mg total) by mouth 2 (two) times daily as needed., Disp: 60 tablet, Rfl: 11 ?  fluticasone furoate-vilanterol (BREO ELLIPTA) 200-25 MCG/INH AEPB, Inhale 1 puff into the lungs daily. Rinse mouth after use., Disp: 1 each, Rfl: 11 ?  Multiple Vitamins-Minerals (ZINC PO), Take by mouth daily., Disp: , Rfl:  ?   multivitamin (ONE-A-DAY MEN'S) TABS tablet, Take 1 tablet by mouth daily., Disp: , Rfl:  ?  pravastatin (PRAVACHOL) 20 MG tablet, Take 1 tablet (20 mg total) by mouth at bedtime., Disp: 90 tablet, Rfl: 3 ?  QUERCETIN PO, Take by mouth., Disp: , Rfl:  ? ?Social History  ? ?Tobacco Use  ?Smoking Status Never  ?Smokeless Tobacco Former  ? Types: Chew  ? ? ?Allergies  ?Allergen Reactions  ? Lisinopril   ?  Cough ?  ? Amoxicillin   ?  Itching/crawling sensation  ?  ? ?Objective:  ?There were no vitals filed for this visit. ?There is no height or weight on file to calculate BMI. ?Constitutional Well developed. ?Well nourished.  ?Vascular Dorsalis pedis pulses palpable bilaterally. ?Posterior tibial pulses palpable bilaterally. ?Capillary refill normal to all digits.  ?No cyanosis or clubbing noted. ?Pedal hair growth normal.  ?Neurologic Normal speech. ?Oriented to person, place, and time. ?Epicritic sensation to light touch grossly present bilaterally.  ?Dermatologic Nails well groomed and normal in appearance. ?No open wounds. ?No skin lesions.  ?Orthopedic: Chronic ankle instability noted with mild anterior drawer tests mild talar tilt test.  Pain on palpation to the ATFL ligament no pain with Achilles tendon at the posterior tibial tendon at the peroneal tendon.  Excessive inversion and  plantarflexion of the foot noted.  No pain with dorsiflexion eversion of the foot.  ? ?Radiographs: 3 views of skeletally mature the right ankle: No osseous break noted.  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 ?Assessment:  ? ?1. Inversion sprain of ankle, right, initial encounter   ? ?Plan:  ?Patient was evaluated and treated and all questions answered. ? ?Right ankle sprain mild to moderate ?-All questions and concerns were discussed with the patient in extensive detail.  At this time I believe he will benefit from ankle brace as he does not want to take time off of work.  He already has a brace at home I have  asked her to place himself in it and start utilizing it.  If there is no improvement he already has a boot at home for him to transition and doing give me a call to extend his out of work note.  He states understanding ?-In the future given his history of chronic ankle instability he will be an ideal candidate for repair of the ATFL ligament with augmentation with Arthrex internal brace. ? ?No follow-ups on file.  ?

## 2022-05-03 ENCOUNTER — Ambulatory Visit (INDEPENDENT_AMBULATORY_CARE_PROVIDER_SITE_OTHER): Payer: 59 | Admitting: Internal Medicine

## 2022-05-03 ENCOUNTER — Encounter: Payer: 59 | Admitting: Internal Medicine

## 2022-05-03 ENCOUNTER — Encounter: Payer: Self-pay | Admitting: Internal Medicine

## 2022-05-03 VITALS — BP 140/100 | HR 95 | Temp 98.4°F | Resp 14 | Ht 68.0 in | Wt 296.6 lb

## 2022-05-03 DIAGNOSIS — Z6841 Body Mass Index (BMI) 40.0 and over, adult: Secondary | ICD-10-CM

## 2022-05-03 DIAGNOSIS — R739 Hyperglycemia, unspecified: Secondary | ICD-10-CM

## 2022-05-03 DIAGNOSIS — Z13220 Encounter for screening for lipoid disorders: Secondary | ICD-10-CM | POA: Diagnosis not present

## 2022-05-03 DIAGNOSIS — E559 Vitamin D deficiency, unspecified: Secondary | ICD-10-CM

## 2022-05-03 DIAGNOSIS — Z0000 Encounter for general adult medical examination without abnormal findings: Secondary | ICD-10-CM | POA: Diagnosis not present

## 2022-05-03 DIAGNOSIS — I10 Essential (primary) hypertension: Secondary | ICD-10-CM

## 2022-05-03 DIAGNOSIS — Z1389 Encounter for screening for other disorder: Secondary | ICD-10-CM

## 2022-05-03 DIAGNOSIS — E785 Hyperlipidemia, unspecified: Secondary | ICD-10-CM

## 2022-05-03 DIAGNOSIS — Z1329 Encounter for screening for other suspected endocrine disorder: Secondary | ICD-10-CM | POA: Diagnosis not present

## 2022-05-03 DIAGNOSIS — R000 Tachycardia, unspecified: Secondary | ICD-10-CM

## 2022-05-03 NOTE — Progress Notes (Addendum)
Chief Complaint  Patient presents with   Annual Exam    No food since 8:30 this morning, denies any pain   Annual 1. Htn uncontrolled on norvasc 2.5 mg bid and HR elevated at times > 100 at home  2. Labs to [facility]    Review of Systems  Constitutional:  Negative for weight loss.  HENT:  Negative for hearing loss.   Eyes:  Negative for blurred vision.  Respiratory:  Negative for shortness of breath.   Cardiovascular:  Negative for chest pain.  Gastrointestinal:  Negative for abdominal pain and blood in stool.  Musculoskeletal:  Negative for back pain.  Skin:  Negative for rash.  Neurological:  Negative for headaches.  Psychiatric/Behavioral:  Negative for depression.    Past Medical History:  Diagnosis Date   Asthma 11/06/2019   Chicken pox    Hyperlipidemia    Hypertension    Nocturnal leg cramps 12/04/2017   Obesity    Obstructive sleep apnea    Past Surgical History:  Procedure Laterality Date   WISDOM TOOTH EXTRACTION     Family History  Problem Relation Age of Onset   Hypertension Mother    Other Mother        fatty liver   Diabetes Father    Cancer Maternal Grandmother        colon, liver   Hypertension Maternal Grandmother    Other Maternal Grandmother        brain anerusym, mini stroke   Cancer Maternal Grandfather        prostate in 47s   Hypertension Maternal Grandfather    Anemia Paternal Grandmother    Cancer Paternal Grandfather        leukemia coal minor   Hypertension Maternal Aunt    Social History   Socioeconomic History   Marital status: Married    Spouse name: Not on file   Number of children: 1   Years of education: 12+   Highest education level: Not on file  Occupational History   Occupation: [employer]  Tobacco Use   Smoking status: Never   Smokeless tobacco: Former    Types: Nurse, children's Use: Never used  Substance and Sexual Activity   Alcohol use: Yes    Alcohol/week: 2.0 standard drinks of alcohol    Types:  2 Cans of beer per week    Comment: on occasion   Drug use: No   Sexual activity: Yes  Other Topics Concern   Not on file  Social History Narrative   Lives with wife, 43 son    59 month old daughter Joelene Millin 11/06/21    04/2021 wife expecting another kid   Caffeine use: 1 cup coffee or 1 sugar free redbull   Works as Conservation officer, historic buildings x 16 years    Married    Left-handed   Social Determinants of Radio broadcast assistant Strain: Not on Art therapist Insecurity: Not on file  Transportation Needs: Not on file  Physical Activity: Not on file  Stress: Not on file  Social Connections: Not on file  Intimate Partner Violence: Not on file   Current Meds  Medication Sig   ascorbic acid (VITAMIN C) 500 MG tablet Take by mouth daily.   Cholecalciferol (VITAMIN D3 PO) Take by mouth. Takes 5000 units daily   clindamycin (CLEOCIN-T) 1 % lotion Apply 1 application topically daily. Apply after shower to affected areas.   Cyanocobalamin (B-12 PO) Take by  mouth.   fluticasone furoate-vilanterol (BREO ELLIPTA) 200-25 MCG/INH AEPB Inhale 1 puff into the lungs daily. Rinse mouth after use.   Multiple Vitamins-Minerals (ZINC PO) Take by mouth daily.   multivitamin (ONE-A-DAY MEN'S) TABS tablet Take 1 tablet by mouth daily.   QUERCETIN PO Take by mouth.   [DISCONTINUED] albuterol (VENTOLIN HFA) 108 (90 Base) MCG/ACT inhaler Inhale 1-2 puffs into the lungs every 4 (four) hours as needed for wheezing or shortness of breath.   [DISCONTINUED] amLODipine (NORVASC) 2.5 MG tablet Take 1 tablet (2.5 mg total) by mouth 2 (two) times daily.   [DISCONTINUED] cyclobenzaprine (FLEXERIL) 5 MG tablet Take 1 tablet (5 mg total) by mouth 2 (two) times daily as needed.   Allergies  Allergen Reactions   Gabapentin     Felt back and didn't work    Lisinopril     Cough    Amoxicillin     Itching/crawling sensation     Recent Results (from the past 2160 hour(s))  Comprehensive metabolic panel     Status:  None   Collection Time: 05/03/22  3:42 PM  Result Value Ref Range   Glucose 89 70 - 99 mg/dL   BUN 12 6 - 24 mg/dL   Creatinine, Ser 0.83 0.76 - 1.27 mg/dL   eGFR 112 >59 mL/min/1.73   BUN/Creatinine Ratio 14 9 - 20   Sodium 141 134 - 144 mmol/L   Potassium 4.1 3.5 - 5.2 mmol/L   Chloride 102 96 - 106 mmol/L   CO2 24 20 - 29 mmol/L   Calcium 9.4 8.7 - 10.2 mg/dL   Total Protein 6.8 6.0 - 8.5 g/dL   Albumin 4.3 4.1 - 5.1 g/dL    Comment:               **Please note reference interval change**   Globulin, Total 2.5 1.5 - 4.5 g/dL   Albumin/Globulin Ratio 1.7 1.2 - 2.2   Bilirubin Total 0.3 0.0 - 1.2 mg/dL   Alkaline Phosphatase 95 44 - 121 IU/L   AST 20 0 - 40 IU/L   ALT 39 0 - 44 IU/L  Lipid panel     Status: Abnormal   Collection Time: 05/03/22  3:42 PM  Result Value Ref Range   Cholesterol, Total 200 (H) 100 - 199 mg/dL   Triglycerides 89 0 - 149 mg/dL   HDL 45 >39 mg/dL   VLDL Cholesterol Cal 16 5 - 40 mg/dL   LDL Chol Calc (NIH) 139 (H) 0 - 99 mg/dL   Chol/HDL Ratio 4.4 0.0 - 5.0 ratio    Comment:                                   T. Chol/HDL Ratio                                             Men  Women                               1/2 Avg.Risk  3.4    3.3  Avg.Risk  5.0    4.4                                2X Avg.Risk  9.6    7.1                                3X Avg.Risk 23.4   11.0   Hemoglobin A1c     Status: None   Collection Time: 05/03/22  3:42 PM  Result Value Ref Range   Hgb A1c MFr Bld 5.4 4.8 - 5.6 %    Comment:          Prediabetes: 5.7 - 6.4          Diabetes: >6.4          Glycemic control for adults with diabetes: <7.0    Est. average glucose Bld gHb Est-mCnc 108 mg/dL  TSH     Status: None   Collection Time: 05/03/22  3:42 PM  Result Value Ref Range   TSH 1.620 0.450 - 4.500 uIU/mL  Urinalysis, Routine w reflex microscopic     Status: None   Collection Time: 05/03/22  3:42 PM  Result Value Ref Range   Specific  Gravity, UA 1.023 1.005 - 1.030   pH, UA 6.0 5.0 - 7.5   Color, UA Yellow Yellow   Appearance Ur Clear Clear   Leukocytes,UA Negative Negative   Protein,UA Negative Negative/Trace   Glucose, UA Negative Negative   Ketones, UA Negative Negative   RBC, UA Negative Negative   Bilirubin, UA Negative Negative   Urobilinogen, Ur 0.2 0.2 - 1.0 mg/dL   Nitrite, UA Negative Negative   Microscopic Examination Comment     Comment: Microscopic not indicated and not performed.  CBC with Differential/Platelet     Status: None   Collection Time: 05/03/22  3:42 PM  Result Value Ref Range   WBC 9.6 3.4 - 10.8 x10E3/uL   RBC 4.88 4.14 - 5.80 x10E6/uL   Hemoglobin 15.6 13.0 - 17.7 g/dL   Hematocrit 45.2 37.5 - 51.0 %   MCV 93 79 - 97 fL   MCH 32.0 26.6 - 33.0 pg   MCHC 34.5 31.5 - 35.7 g/dL   RDW 12.5 11.6 - 15.4 %   Platelets 269 150 - 450 x10E3/uL   Neutrophils 65 Not Estab. %   Lymphs 25 Not Estab. %   Monocytes 7 Not Estab. %   Eos 2 Not Estab. %   Basos 1 Not Estab. %   Neutrophils Absolute 6.3 1.4 - 7.0 x10E3/uL   Lymphocytes Absolute 2.4 0.7 - 3.1 x10E3/uL   Monocytes Absolute 0.6 0.1 - 0.9 x10E3/uL   EOS (ABSOLUTE) 0.2 0.0 - 0.4 x10E3/uL   Basophils Absolute 0.1 0.0 - 0.2 x10E3/uL   Immature Granulocytes 0 Not Estab. %   Immature Grans (Abs) 0.0 0.0 - 0.1 x10E3/uL  Vitamin D (25 hydroxy)     Status: None   Collection Time: 05/03/22  3:42 PM  Result Value Ref Range   Vit D, 25-Hydroxy 34.6 30.0 - 100.0 ng/mL    Comment: Vitamin D deficiency has been defined by the Institute of Medicine and an Endocrine Society practice guideline as a level of serum 25-OH vitamin D less than 20 ng/mL (1,2). The Endocrine Society went on to further define vitamin D insufficiency as a level between 21 and 29  ng/mL (2). 1. IOM (Institute of Medicine). 2010. Dietary reference    intakes for calcium and D. [place]: The    [employer]. 2. Holick MF, Binkley [place], Bischoff-Ferrari HA,  et al.    Evaluation, treatment, and prevention of vitamin D    deficiency: an Endocrine Society clinical practice    guideline. JCEM. 2011 Jul; 96(7):1911-30.    Objective  Body mass index is 45.1 kg/m. Wt Readings from Last 3 Encounters:  05/03/22 296 lb 9.6 oz (134.5 kg)  06/07/21 289 lb 4 oz (131.2 kg)  04/26/21 292 lb 6.4 oz (132.6 kg)   Temp Readings from Last 3 Encounters:  05/03/22 98.4 F (36.9 C) (Oral)  06/07/21 98.2 F (36.8 C) (Temporal)  04/26/21 98.4 F (36.9 C) (Oral)   BP Readings from Last 3 Encounters:  05/03/22 (!) 140/100  06/07/21 (!) 130/96  04/26/21 126/84   Pulse Readings from Last 3 Encounters:  05/03/22 95  06/07/21 (!) 121  04/26/21 86    Physical Exam Vitals and nursing note reviewed.  Constitutional:      Appearance: Normal appearance. He is well-developed and well-groomed.  HENT:     Head: Normocephalic and atraumatic.  Eyes:     Conjunctiva/sclera: Conjunctivae normal.     Pupils: Pupils are equal, round, and reactive to light.  Cardiovascular:     Rate and Rhythm: Normal rate and regular rhythm.     Heart sounds: Normal heart sounds.  Pulmonary:     Effort: Pulmonary effort is normal. No respiratory distress.     Breath sounds: Normal breath sounds.  Abdominal:     Tenderness: There is no abdominal tenderness.  Skin:    General: Skin is warm and moist.  Neurological:     General: No focal deficit present.     Mental Status: He is alert and oriented to person, place, and time. Mental status is at baseline.     Sensory: Sensation is intact.     Motor: Motor function is intact.     Coordination: Coordination is intact.     Gait: Gait is intact. Gait normal.  Psychiatric:        Attention and Perception: Attention and perception normal.        Mood and Affect: Mood and affect normal.        Speech: Speech normal.        Behavior: Behavior normal. Behavior is cooperative.        Thought Content: Thought content normal.         Cognition and Memory: Cognition and memory normal.        Judgment: Judgment normal.     Assessment  Plan  Annual physical exam - Plan: Comprehensive metabolic panel, Lipid panel, Hemoglobin A1c, TSH, Urinalysis, Routine w reflex microscopic, CBC with Differential/Platelet, Vitamin D (25 hydroxy) See below   Had flu shot utd  covid vaccine declines for now rec MMR has not had yet  Hep A/B immune with h/o fatty liver   Colonoscopy screening 45-49 had at age 54s with polyps benign per pt mGM colon cancer older age    PSA normal 01/07/19 0.8 mGF prostate cancer early 71s  Check again age 38 baseline   Skin tags referred dermatology prev  rec healthy diet and exercise he is working out 5 x per week and doing martial arts   HTN elevated/ST On norvasc 2.5 mg bid  Added bystolic 5 mg after visit and HR and BP improved  BMI >45/htn/hld  Added saxenda per pt request  Provider: Dr. Olivia Mackie McLean-Scocuzza-Internal Medicine

## 2022-05-03 NOTE — Patient Instructions (Addendum)
Alpha lipoic acid 600 mg bid for neuropathy   Dr. Carollee Leitz   Goal <130/<80 blood pressure   Let me know saxenda or wegovy  Semaglutide Injection (Weight Management) What is this medication? SEMAGLUTIDE (SEM a GLOO tide) promotes weight loss. It may also be used to maintain weight loss. It works by decreasing appetite. Changes to diet and exercise are often combined with this medication. This medicine may be used for other purposes; ask your health care provider or pharmacist if you have questions. COMMON BRAND NAME(S): GLOVFI What should I tell my care team before I take this medication? They need to know if you have any of these conditions: Endocrine tumors (MEN 2) or if someone in your family had these tumors Eye disease, vision problems Gallbladder disease History of depression or mental health disease History of pancreatitis Kidney disease Stomach or intestine problems Suicidal thoughts, plans, or attempt; a previous suicide attempt by you or a family member Thyroid cancer or if someone in your family had thyroid cancer An unusual or allergic reaction to semaglutide, other medications, foods, dyes, or preservatives Pregnant or trying to get pregnant Breast-feeding How should I use this medication? This medication is injected under the skin. You will be taught how to prepare and give it. Take it as directed on the prescription label. It is given once every week (every 7 days). Keep taking it unless your care team tells you to stop. It is important that you put your used needles and pens in a special sharps container. Do not put them in a trash can. If you do not have a sharps container, call your pharmacist or care team to get one. A special MedGuide will be given to you by the pharmacist with each prescription and refill. Be sure to read this information carefully each time. This medication comes with INSTRUCTIONS FOR USE. Ask your pharmacist for directions on how to use this  medication. Read the information carefully. Talk to your pharmacist or care team if you have questions. Talk to your care team about the use of this medication in children. While it may be prescribed for children as young as 12 years for selected conditions, precautions do apply. Overdosage: If you think you have taken too much of this medicine contact a poison control center or emergency room at once. NOTE: This medicine is only for you. Do not share this medicine with others. What if I miss a dose? If you miss a dose and the next scheduled dose is more than 2 days away, take the missed dose as soon as possible. If you miss a dose and the next scheduled dose is less than 2 days away, do not take the missed dose. Take the next dose at your regular time. Do not take double or extra doses. If you miss your dose for 2 weeks or more, take the next dose at your regular time or call your care team to talk about how to restart this medication. What may interact with this medication? Insulin and other medications for diabetes This list may not describe all possible interactions. Give your health care provider a list of all the medicines, herbs, non-prescription drugs, or dietary supplements you use. Also tell them if you smoke, drink alcohol, or use illegal drugs. Some items may interact with your medicine. What should I watch for while using this medication? Visit your care team for regular checks on your progress. It may be some time before you see the benefit from  this medication. Drink plenty of fluids while taking this medication. Check with your care team if you have severe diarrhea, nausea, and vomiting, or if you sweat a lot. The loss of too much body fluid may make it dangerous for you to take this medication. This medication may affect blood sugar levels. Ask your care team if changes in diet or medications are needed if you have diabetes. If you or your family notice any changes in your behavior,  such as new or worsening depression, thoughts of harming yourself, anxiety, other unusual or disturbing thoughts, or memory loss, call your care team right away. Women should inform their care team if they wish to become pregnant or think they might be pregnant. Losing weight while pregnant is not advised and may cause harm to the unborn child. Talk to your care team for more information. What side effects may I notice from receiving this medication? Side effects that you should report to your care team as soon as possible: Allergic reactions--skin rash, itching, hives, swelling of the face, lips, tongue, or throat Change in vision Dehydration--increased thirst, dry mouth, feeling faint or lightheaded, headache, dark yellow or brown urine Gallbladder problems--severe stomach pain, nausea, vomiting, fever Heart palpitations--rapid, pounding, or irregular heartbeat Kidney injury--decrease in the amount of urine, swelling of the ankles, hands, or feet Pancreatitis--severe stomach pain that spreads to your back or gets worse after eating or when touched, fever, nausea, vomiting Thoughts of suicide or self-harm, worsening mood, feelings of depression Thyroid cancer--new mass or lump in the neck, pain or trouble swallowing, trouble breathing, hoarseness Side effects that usually do not require medical attention (report to your care team if they continue or are bothersome): Diarrhea Loss of appetite Nausea Stomach pain Vomiting This list may not describe all possible side effects. Call your doctor for medical advice about side effects. You may report side effects to FDA at 1-800-FDA-1088. Where should I keep my medication? Keep out of the reach of children and pets. Refrigeration (preferred): Store in the refrigerator. Do not freeze. Keep this medication in the original container until you are ready to take it. Get rid of any unused medication after the expiration date. Room temperature: If needed,  prior to cap removal, the pen can be stored at room temperature for up to 28 days. Protect from light. If it is stored at room temperature, get rid of any unused medication after 28 days or after it expires, whichever is first. It is important to get rid of the medication as soon as you no longer need it or it is expired. You can do this in two ways: Take the medication to a medication take-back program. Check with your pharmacy or law enforcement to find a location. If you cannot return the medication, follow the directions in the [place]. NOTE: This sheet is a summary. It may not cover all possible information. If you have questions about this medicine, talk to your doctor, pharmacist, or health care provider.  2023 Elsevier/Gold Standard (2021-10-25 00:00:00)   Semaglutide Injection (Weight Management) What is this medication? SEMAGLUTIDE (SEM a GLOO tide) promotes weight loss. It may also be used to maintain weight loss. It works by decreasing appetite. Changes to diet and exercise are often combined with this medication. This medicine may be used for other purposes; ask your health care provider or pharmacist if you have questions. COMMON BRAND NAME(S): IWLNLG What should I tell my care team before I take this medication? They need to know if  you have any of these conditions: Endocrine tumors (MEN 2) or if someone in your family had these tumors Eye disease, vision problems Gallbladder disease History of depression or mental health disease History of pancreatitis Kidney disease Stomach or intestine problems Suicidal thoughts, plans, or attempt; a previous suicide attempt by you or a family member Thyroid cancer or if someone in your family had thyroid cancer An unusual or allergic reaction to semaglutide, other medications, foods, dyes, or preservatives Pregnant or trying to get pregnant Breast-feeding How should I use this medication? This medication is injected under the skin.  You will be taught how to prepare and give it. Take it as directed on the prescription label. It is given once every week (every 7 days). Keep taking it unless your care team tells you to stop. It is important that you put your used needles and pens in a special sharps container. Do not put them in a trash can. If you do not have a sharps container, call your pharmacist or care team to get one. A special MedGuide will be given to you by the pharmacist with each prescription and refill. Be sure to read this information carefully each time. This medication comes with INSTRUCTIONS FOR USE. Ask your pharmacist for directions on how to use this medication. Read the information carefully. Talk to your pharmacist or care team if you have questions. Talk to your care team about the use of this medication in children. While it may be prescribed for children as young as 12 years for selected conditions, precautions do apply. Overdosage: If you think you have taken too much of this medicine contact a poison control center or emergency room at once. NOTE: This medicine is only for you. Do not share this medicine with others. What if I miss a dose? If you miss a dose and the next scheduled dose is more than 2 days away, take the missed dose as soon as possible. If you miss a dose and the next scheduled dose is less than 2 days away, do not take the missed dose. Take the next dose at your regular time. Do not take double or extra doses. If you miss your dose for 2 weeks or more, take the next dose at your regular time or call your care team to talk about how to restart this medication. What may interact with this medication? Insulin and other medications for diabetes This list may not describe all possible interactions. Give your health care provider a list of all the medicines, herbs, non-prescription drugs, or dietary supplements you use. Also tell them if you smoke, drink alcohol, or use illegal drugs. Some items  may interact with your medicine. What should I watch for while using this medication? Visit your care team for regular checks on your progress. It may be some time before you see the benefit from this medication. Drink plenty of fluids while taking this medication. Check with your care team if you have severe diarrhea, nausea, and vomiting, or if you sweat a lot. The loss of too much body fluid may make it dangerous for you to take this medication. This medication may affect blood sugar levels. Ask your care team if changes in diet or medications are needed if you have diabetes. If you or your family notice any changes in your behavior, such as new or worsening depression, thoughts of harming yourself, anxiety, other unusual or disturbing thoughts, or memory loss, call your care team right away. Women should inform  their care team if they wish to become pregnant or think they might be pregnant. Losing weight while pregnant is not advised and may cause harm to the unborn child. Talk to your care team for more information. What side effects may I notice from receiving this medication? Side effects that you should report to your care team as soon as possible: Allergic reactions--skin rash, itching, hives, swelling of the face, lips, tongue, or throat Change in vision Dehydration--increased thirst, dry mouth, feeling faint or lightheaded, headache, dark yellow or brown urine Gallbladder problems--severe stomach pain, nausea, vomiting, fever Heart palpitations--rapid, pounding, or irregular heartbeat Kidney injury--decrease in the amount of urine, swelling of the ankles, hands, or feet Pancreatitis--severe stomach pain that spreads to your back or gets worse after eating or when touched, fever, nausea, vomiting Thoughts of suicide or self-harm, worsening mood, feelings of depression Thyroid cancer--new mass or lump in the neck, pain or trouble swallowing, trouble breathing, hoarseness Side effects that  usually do not require medical attention (report to your care team if they continue or are bothersome): Diarrhea Loss of appetite Nausea Stomach pain Vomiting This list may not describe all possible side effects. Call your doctor for medical advice about side effects. You may report side effects to FDA at 1-800-FDA-1088. Where should I keep my medication? Keep out of the reach of children and pets. Refrigeration (preferred): Store in the refrigerator. Do not freeze. Keep this medication in the original container until you are ready to take it. Get rid of any unused medication after the expiration date. Room temperature: If needed, prior to cap removal, the pen can be stored at room temperature for up to 28 days. Protect from light. If it is stored at room temperature, get rid of any unused medication after 28 days or after it expires, whichever is first. It is important to get rid of the medication as soon as you no longer need it or it is expired. You can do this in two ways: Take the medication to a medication take-back program. Check with your pharmacy or law enforcement to find a location. If you cannot return the medication, follow the directions in the [place]. NOTE: This sheet is a summary. It may not cover all possible information. If you have questions about this medicine, talk to your doctor, pharmacist, or health care provider.  2023 Elsevier/Gold Standard (2021-10-25 00:00:00)  Liraglutide Injection (Weight Management) What is this medication? LIRAGLUTIDE (LIR a GLOO tide) promotes weight loss. It may also be used to maintain weight loss. It works by decreasing appetite. Changes to diet and exercise are often combined with this medication. This medicine may be used for other purposes; ask your health care provider or pharmacist if you have questions. COMMON BRAND NAME(S): Saxenda What should I tell my care team before I take this medication? They need to know if you have any of  these conditions: Endocrine tumors (MEN 2) or if someone in your family had these tumors Gallbladder disease High cholesterol History of alcohol abuse problem History of pancreatitis Kidney disease or if you are on dialysis Liver disease Previous swelling of the tongue, face, or lips with difficulty breathing, difficulty swallowing, hoarseness, or tightening of the throat Stomach problems Suicidal thoughts, plans, or attempt; a previous suicide attempt by you or a family member Thyroid cancer or if someone in your family had thyroid cancer An unusual or allergic reaction to liraglutide, other medications, foods, dyes, or preservatives Pregnant or trying to get pregnant Breast-feeding How  should I use this medication? This medication is for injection under the skin of your upper leg, stomach area, or upper arm. You will be taught how to prepare and give this medication. Use exactly as directed. Take your medication at regular intervals. Do not take it more often than directed. This medication comes with INSTRUCTIONS FOR USE. Ask your pharmacist for directions on how to use this medication. Read the information carefully. Talk to your pharmacist or care team if you have questions. It is important that you put your used needles and syringes in a special sharps container. Do not put them in a trash can. If you do not have a sharps container, call your pharmacist or care team to get one. A special MedGuide will be given to you by the pharmacist with each prescription and refill. Be sure to read this information carefully each time. Talk to your care team about the use of this medication in children. While it may be prescribed for children as young as 87 years of age for selected conditions, precautions do apply. Overdosage: If you think you have taken too much of this medicine contact a poison control center or emergency room at once. NOTE: This medicine is only for you. Do not share this medicine  with others. What if I miss a dose? If you miss a dose, take it as soon as you can. If it is almost time for your next dose, take only that dose. Do not take double or extra doses. If you miss your dose for 3 days or more, call your care team to talk about how to restart this medicine. What may interact with this medication? Insulin and other medications for diabetes This list may not describe all possible interactions. Give your health care provider a list of all the medicines, herbs, non-prescription drugs, or dietary supplements you use. Also tell them if you smoke, drink alcohol, or use illegal drugs. Some items may interact with your medicine. What should I watch for while using this medication? Visit your care team for regular checks on your progress. Drink plenty of fluids while taking this medication. Check with your care team if you get an attack of severe diarrhea, nausea, and vomiting. The loss of too much body fluid can make it dangerous for you to take this medication. This medication may affect blood sugar levels. Ask your care team if changes in diet or medications are needed if you have diabetes. Patients and their families should watch out for worsening depression or thoughts of suicide. Also watch out for sudden changes in feelings such as feeling anxious, agitated, panicky, irritable, hostile, aggressive, impulsive, severely restless, overly excited and hyperactive, or not being able to sleep. If this happens, especially at the beginning of treatment or after a change in dose, call your care team. Women should inform their care team if they wish to become pregnant or think they might be pregnant. Losing weight while pregnant is not advised and may cause harm to the unborn child. Talk to your care team for more information. What side effects may I notice from receiving this medication? Side effects that you should report to your care team as soon as possible: Allergic reactions or  angioedema--skin rash, itching, hives, swelling of the face, eyes, lips, tongue, arms, or legs, trouble swallowing or breathing Fast or irregular heartbeat Gallbladder problems--severe stomach pain, nausea, vomiting, fever Kidney injury--decrease in the amount of urine, swelling of the ankles, hands, or feet Pancreatitis--severe stomach pain that  spreads to your back or gets worse after eating or when touched, fever, nausea, vomiting Thoughts of suicide or self-harm, worsening mood, feelings of depression Thyroid cancer--new mass or lump in the neck, pain or trouble swallowing, trouble breathing, hoarseness Side effects that usually do not require medical attention (report to your care team if they continue or are bothersome): Constipation Dizziness Fatigue Headache Loss of Appetite Nausea Upset stomach This list may not describe all possible side effects. Call your doctor for medical advice about side effects. You may report side effects to FDA at 1-800-FDA-1088. Where should I keep my medication? Keep out of the reach of children and pets. Store unopened pen in a refrigerator between 2 and 8 degrees C (36 and 46 degrees F). Do not freeze or use if the medication has been frozen. Protect from light and excessive heat. After you first use the pen, it can be stored at room temperature between 15 and 30 degrees C (59 and 86 degrees F) or in a refrigerator. Throw away your used pen after 30 days or after the expiration date, whichever comes first. Do not store your pen with the needle attached. If the needle is left on, medication may leak from the pen. NOTE: This sheet is a summary. It may not cover all possible information. If you have questions about this medicine, talk to your doctor, pharmacist, or health care provider.  2023 Elsevier/Gold Standard (2020-11-11 00:00:00)

## 2022-05-04 ENCOUNTER — Telehealth: Payer: Self-pay

## 2022-05-04 ENCOUNTER — Other Ambulatory Visit: Payer: Self-pay | Admitting: Internal Medicine

## 2022-05-04 DIAGNOSIS — J40 Bronchitis, not specified as acute or chronic: Secondary | ICD-10-CM

## 2022-05-04 DIAGNOSIS — M62838 Other muscle spasm: Secondary | ICD-10-CM

## 2022-05-04 DIAGNOSIS — R000 Tachycardia, unspecified: Secondary | ICD-10-CM

## 2022-05-04 DIAGNOSIS — J9801 Acute bronchospasm: Secondary | ICD-10-CM

## 2022-05-04 DIAGNOSIS — I10 Essential (primary) hypertension: Secondary | ICD-10-CM

## 2022-05-04 DIAGNOSIS — E785 Hyperlipidemia, unspecified: Secondary | ICD-10-CM

## 2022-05-04 LAB — URINALYSIS, ROUTINE W REFLEX MICROSCOPIC
Bilirubin, UA: NEGATIVE
Glucose, UA: NEGATIVE
Ketones, UA: NEGATIVE
Leukocytes,UA: NEGATIVE
Nitrite, UA: NEGATIVE
Protein,UA: NEGATIVE
RBC, UA: NEGATIVE
Specific Gravity, UA: 1.023 (ref 1.005–1.030)
Urobilinogen, Ur: 0.2 mg/dL (ref 0.2–1.0)
pH, UA: 6 (ref 5.0–7.5)

## 2022-05-04 LAB — CBC WITH DIFFERENTIAL/PLATELET
Basophils Absolute: 0.1 10*3/uL (ref 0.0–0.2)
Basos: 1 %
EOS (ABSOLUTE): 0.2 10*3/uL (ref 0.0–0.4)
Eos: 2 %
Hematocrit: 45.2 % (ref 37.5–51.0)
Hemoglobin: 15.6 g/dL (ref 13.0–17.7)
Immature Grans (Abs): 0 10*3/uL (ref 0.0–0.1)
Immature Granulocytes: 0 %
Lymphocytes Absolute: 2.4 10*3/uL (ref 0.7–3.1)
Lymphs: 25 %
MCH: 32 pg (ref 26.6–33.0)
MCHC: 34.5 g/dL (ref 31.5–35.7)
MCV: 93 fL (ref 79–97)
Monocytes Absolute: 0.6 10*3/uL (ref 0.1–0.9)
Monocytes: 7 %
Neutrophils Absolute: 6.3 10*3/uL (ref 1.4–7.0)
Neutrophils: 65 %
Platelets: 269 10*3/uL (ref 150–450)
RBC: 4.88 x10E6/uL (ref 4.14–5.80)
RDW: 12.5 % (ref 11.6–15.4)
WBC: 9.6 10*3/uL (ref 3.4–10.8)

## 2022-05-04 LAB — LIPID PANEL
Chol/HDL Ratio: 4.4 ratio (ref 0.0–5.0)
Cholesterol, Total: 200 mg/dL — ABNORMAL HIGH (ref 100–199)
HDL: 45 mg/dL (ref >39)
LDL Chol Calc (NIH): 139 mg/dL — ABNORMAL HIGH (ref 0–99)
Triglycerides: 89 mg/dL (ref 0–149)
VLDL Cholesterol Cal: 16 mg/dL (ref 5–40)

## 2022-05-04 LAB — HEMOGLOBIN A1C
Est. average glucose Bld gHb Est-mCnc: 108 mg/dL
Hgb A1c MFr Bld: 5.4 % (ref 4.8–5.6)

## 2022-05-04 LAB — COMPREHENSIVE METABOLIC PANEL
ALT: 39 IU/L (ref 0–44)
AST: 20 IU/L (ref 0–40)
Albumin/Globulin Ratio: 1.7 (ref 1.2–2.2)
Albumin: 4.3 g/dL (ref 4.1–5.1)
Alkaline Phosphatase: 95 IU/L (ref 44–121)
BUN/Creatinine Ratio: 14 (ref 9–20)
BUN: 12 mg/dL (ref 6–24)
Bilirubin Total: 0.3 mg/dL (ref 0.0–1.2)
CO2: 24 mmol/L (ref 20–29)
Calcium: 9.4 mg/dL (ref 8.7–10.2)
Chloride: 102 mmol/L (ref 96–106)
Creatinine, Ser: 0.83 mg/dL (ref 0.76–1.27)
Globulin, Total: 2.5 g/dL (ref 1.5–4.5)
Glucose: 89 mg/dL (ref 70–99)
Potassium: 4.1 mmol/L (ref 3.5–5.2)
Sodium: 141 mmol/L (ref 134–144)
Total Protein: 6.8 g/dL (ref 6.0–8.5)
eGFR: 112 mL/min/{1.73_m2} (ref >59)

## 2022-05-04 LAB — VITAMIN D 25 HYDROXY (VIT D DEFICIENCY, FRACTURES): Vit D, 25-Hydroxy: 34.6 ng/mL (ref 30.0–100.0)

## 2022-05-04 LAB — TSH: TSH: 1.62 u[IU]/mL (ref 0.450–4.500)

## 2022-05-04 MED ORDER — ALBUTEROL SULFATE HFA 108 (90 BASE) MCG/ACT IN AERS: 1.0000 | INHALATION_SPRAY | RESPIRATORY_TRACT | 11 refills | Status: DC | PRN

## 2022-05-04 MED ORDER — AMLODIPINE BESYLATE 2.5 MG PO TABS: 2.5000 mg | ORAL_TABLET | Freq: Two times a day (BID) | ORAL | 3 refills | Status: DC

## 2022-05-04 MED ORDER — PRAVASTATIN SODIUM 20 MG PO TABS: 20.0000 mg | ORAL_TABLET | Freq: Every day | ORAL | 3 refills | Status: DC

## 2022-05-04 MED ORDER — CYCLOBENZAPRINE HCL 5 MG PO TABS: 5.0000 mg | ORAL_TABLET | Freq: Two times a day (BID) | ORAL | 11 refills | Status: DC | PRN

## 2022-05-04 MED ORDER — NEBIVOLOL HCL 5 MG PO TABS: 5.0000 mg | ORAL_TABLET | Freq: Every day | ORAL | 3 refills | Status: DC

## 2022-05-04 NOTE — Telephone Encounter (Signed)
PA has been started via covermymeds on 05/04/22 created by Littie Deeds pharmacist at [facility] for pt's Nebivolol HCl '5MG'$  tablets  Key: [place] PA Case [id number]  Rx #:[id number]  Awaiting denial or approval

## 2022-05-08 DIAGNOSIS — Z6841 Body Mass Index (BMI) 40.0 and over, adult: Secondary | ICD-10-CM | POA: Insufficient documentation

## 2022-05-14 MED ORDER — INSULIN PEN NEEDLE 30G X 8 MM MISC: 1.0000 | 3 refills | Status: DC | PRN

## 2022-05-14 MED ORDER — SAXENDA 18 MG/3ML ~~LOC~~ SOPN: 0.6000 mg | PEN_INJECTOR | Freq: Every day | SUBCUTANEOUS | 5 refills | Status: DC

## 2022-05-14 NOTE — Addendum Note (Signed)
Addended by: Orland Mustard on: 05/14/2022 09:47 PM   Modules accepted: Orders

## 2022-05-15 NOTE — Telephone Encounter (Signed)
PA has been started via covermymeds on 05/15/22 for pt's Saxenda '18MG'$ /3ML pen-injectors  [id number] Rx #:[id number]  Awaiting denial or approval

## 2022-10-19 ENCOUNTER — Telehealth: Payer: Self-pay

## 2022-10-19 ENCOUNTER — Other Ambulatory Visit (HOSPITAL_COMMUNITY): Payer: Self-pay

## 2022-10-19 NOTE — Telephone Encounter (Signed)
Pharmacy Patient Advocate Encounter   Received notification from [facility] that prior authorization for Saxenda '18mg'$ /84m is required/requested.  Per Test Claim: P/A is not required. Spoke with the pharmacy to process and they're currently awaiting for the SSunnyvaleshipment to come in.

## 2022-10-19 NOTE — Telephone Encounter (Signed)
Called and updated pt opt stated he was told that nobody had the saxenda its on back order for a lot of pharmacies.

## 2023-04-03 ENCOUNTER — Encounter: Payer: Self-pay | Admitting: Nurse Practitioner

## 2023-04-03 ENCOUNTER — Ambulatory Visit (INDEPENDENT_AMBULATORY_CARE_PROVIDER_SITE_OTHER): Payer: 59 | Admitting: Nurse Practitioner

## 2023-04-03 VITALS — BP 130/86 | HR 69 | Temp 98.9°F | Ht 68.0 in | Wt 287.1 lb

## 2023-04-03 DIAGNOSIS — Z1329 Encounter for screening for other suspected endocrine disorder: Secondary | ICD-10-CM

## 2023-04-03 DIAGNOSIS — E559 Vitamin D deficiency, unspecified: Secondary | ICD-10-CM

## 2023-04-03 DIAGNOSIS — E785 Hyperlipidemia, unspecified: Secondary | ICD-10-CM | POA: Diagnosis not present

## 2023-04-03 DIAGNOSIS — Z6841 Body Mass Index (BMI) 40.0 and over, adult: Secondary | ICD-10-CM

## 2023-04-03 DIAGNOSIS — I10 Essential (primary) hypertension: Secondary | ICD-10-CM | POA: Diagnosis not present

## 2023-04-03 DIAGNOSIS — G4733 Obstructive sleep apnea (adult) (pediatric): Secondary | ICD-10-CM | POA: Diagnosis not present

## 2023-04-03 NOTE — Progress Notes (Signed)
Bethanie Dicker, NP-C Phone: [phone]  Caleb [name] is a 42 y.o. male who presents today for transfer of care. He has no complaints or new concerns today. He is doing well on his medications.   HYPERTENSION Disease Monitoring Home BP Monitoring- 130s/80s Chest pain- No    Dyspnea- No Medications Compliance-  Bystolic and Norvasc. Lightheadedness-  No  Edema- No BMET    Component Value Date/Time   NA 141 05/03/2022 1542   K 4.1 05/03/2022 1542   CL 102 05/03/2022 1542   CO2 24 05/03/2022 1542   GLUCOSE 89 05/03/2022 1542   GLUCOSE 101 (H) 12/24/2018 2224   BUN 12 05/03/2022 1542   CREATININE 0.83 05/03/2022 1542   CALCIUM 9.4 05/03/2022 1542   GFRNONAA 100 12/18/2019 1558   GFRAA 115 12/18/2019 1558   HYPERLIPIDEMIA Symptoms Chest pain on exertion:  No   Leg claudication:   No Medications: Compliance- None Right upper quadrant pain- No  Muscle aches- No Lipid Panel     Component Value Date/Time   CHOL 200 (H) 05/03/2022 1542   CHOL 176 07/12/2015 0951   TRIG 89 05/03/2022 1542   TRIG 164 (H) 07/12/2015 0951   HDL 45 05/03/2022 1542   CHOLHDL 4.4 05/03/2022 1542   CHOLHDL 4 08/13/2016 0918   VLDL 17.8 08/13/2016 0918   VLDL 33 (H) 07/12/2015 0951   LDLCALC 139 (H) 05/03/2022 1542   LABVLDL 16 05/03/2022 1542   OSA: He does use his CPAP nightly. He gets approximately 5-6 hours of sleep. He has been working night shift which is not his normal schedule, he is anticipating switching back to day shift soon.   Social History   Tobacco Use  Smoking Status Never  Smokeless Tobacco Former   Types: Chew    Current Outpatient Medications on File Prior to Visit  Medication Sig Dispense Refill   albuterol (VENTOLIN HFA) 108 (90 Base) MCG/ACT inhaler Inhale 1-2 puffs into the lungs every 4 (four) hours as needed for wheezing or shortness of breath. 18 g 11   amLODipine (NORVASC) 2.5 MG tablet Take 1 tablet (2.5 mg total) by mouth 2 (two) times daily. 180 tablet 3    ascorbic acid (VITAMIN C) 500 MG tablet Take by mouth daily.     Cholecalciferol (VITAMIN D3 PO) Take by mouth. Takes 5000 units daily     clindamycin (CLEOCIN-T) 1 % lotion Apply 1 application topically daily. Apply after shower to affected areas. 60 mL 6   cyclobenzaprine (FLEXERIL) 5 MG tablet Take 1 tablet (5 mg total) by mouth 2 (two) times daily as needed. 60 tablet 11   Insulin Pen Needle (NOVOFINE) 30G X 8 MM MISC Inject 10 each into the skin as needed. 100 each 3   Multiple Vitamins-Minerals (ZINC PO) Take by mouth daily.     multivitamin (ONE-A-DAY MEN'S) TABS tablet Take 1 tablet by mouth daily.     nebivolol (BYSTOLIC) 5 MG tablet Take 1 tablet (5 mg total) by mouth daily. 90 tablet 3   QUERCETIN PO Take by mouth.     No current facility-administered medications on file prior to visit.     ROS see history of present illness  Objective  Physical Exam Vitals:   04/03/23 1447 04/03/23 1520  BP: (!) 140/86 130/86  Pulse: 69   Temp: 98.9 F (37.2 C)   SpO2: 98%     BP Readings from Last 3 Encounters:  04/03/23 130/86  05/03/22 (!) 140/100  06/07/21 (!) 130/96  Wt Readings from Last 3 Encounters:  04/03/23 287 lb 1.9 oz (130.2 kg)  05/03/22 296 lb 9.6 oz (134.5 kg)  06/07/21 289 lb 4 oz (131.2 kg)    Physical Exam Constitutional:      General: He is not in acute distress.    Appearance: Normal appearance.  HENT:     Head: Normocephalic.  Cardiovascular:     Rate and Rhythm: Normal rate and regular rhythm.     Heart sounds: Normal heart sounds.  Pulmonary:     Effort: Pulmonary effort is normal.     Breath sounds: Normal breath sounds.  Skin:    General: Skin is warm and dry.  Neurological:     General: No focal deficit present.     Mental Status: He is alert.  Psychiatric:        Mood and Affect: Mood normal.        Behavior: Behavior normal.    Assessment/Plan: Please see individual problem list.  Primary hypertension Assessment &  Plan: Chronic. Elevated today in office. He will continue his Bystolic 5 mg daily and Norvasc 2.5 mg BID. Advised to continue checking blood pressure daily at home and bring log to next appointment. He has been working on making dietary changes. Advised to decrease salt and caffeine. Lab work as outlined. Will continue to monitor.   Orders: -     CBC with Differential/Platelet; Future -     Comprehensive metabolic panel; Future  Hyperlipidemia, unspecified hyperlipidemia type Assessment & Plan: Chronic. Patient was started on Pravastatin last year, he reports never starting on the medication. He has been making dietary changes. Encouraged to continue healthy diet and exercise. Will check lipid panel.   Orders: -     Lipid panel; Future  Obstructive sleep apnea Assessment & Plan: Continue wearing CPAP. Encouraged to continue working on healthy diet and exercise.   Orders: -     B12 and Folate Panel; Future  Morbid obesity with BMI of 40.0-44.9, adult [facility]) Assessment & Plan: Will check A1c. Encouraged to continue working on healthy diet and exercise.   Orders: -     Hemoglobin A1c; Future  Vitamin D deficiency Assessment & Plan: Chronic. Taking OTC daily supplement. Continue. Will check vitamin D level.   Orders: -     VITAMIN D 25 Hydroxy (Vit-D Deficiency, Fractures); Future  Thyroid disorder screen -     TSH; Future   Return in 5 weeks (on 05/08/2023) for Annual Exam.   Bethanie Dicker, NP-C [facility] - [employer]

## 2023-04-04 ENCOUNTER — Encounter: Payer: Self-pay | Admitting: Nurse Practitioner

## 2023-04-04 ENCOUNTER — Other Ambulatory Visit: Payer: Self-pay | Admitting: Nurse Practitioner

## 2023-04-04 DIAGNOSIS — I10 Essential (primary) hypertension: Secondary | ICD-10-CM

## 2023-04-10 ENCOUNTER — Encounter: Payer: Self-pay | Admitting: Nurse Practitioner

## 2023-04-10 DIAGNOSIS — E559 Vitamin D deficiency, unspecified: Secondary | ICD-10-CM | POA: Insufficient documentation

## 2023-04-10 NOTE — Assessment & Plan Note (Addendum)
Chronic. Taking OTC daily supplement. Continue. Will check vitamin D level.  

## 2023-04-10 NOTE — Assessment & Plan Note (Addendum)
Chronic. Elevated today in office. He will continue his Bystolic 5 mg daily and Norvasc 2.5 mg BID. Advised to continue checking blood pressure daily at home and bring log to next appointment. He has been working on making dietary changes. Advised to decrease salt and caffeine. Lab work as outlined. Will continue to monitor.

## 2023-04-10 NOTE — Assessment & Plan Note (Signed)
Chronic. Patient was started on Pravastatin last year, he reports never starting on the medication. He has been making dietary changes. Encouraged to continue healthy diet and exercise. Will check lipid panel.

## 2023-04-10 NOTE — Assessment & Plan Note (Addendum)
Will check A1c. Encouraged to continue working on healthy diet and exercise.

## 2023-04-10 NOTE — Assessment & Plan Note (Signed)
Continue wearing CPAP. Encouraged to continue working on healthy diet and exercise.

## 2023-04-12 ENCOUNTER — Other Ambulatory Visit: Payer: Self-pay | Admitting: Nurse Practitioner

## 2023-04-13 LAB — CBC WITH DIFFERENTIAL/PLATELET
Basophils Absolute: 0.1 10*3/uL (ref 0.0–0.2)
Basos: 1 %
EOS (ABSOLUTE): 0.2 10*3/uL (ref 0.0–0.4)
Eos: 2 %
Hematocrit: 45.7 % (ref 37.5–51.0)
Hemoglobin: 15.2 g/dL (ref 13.0–17.7)
Immature Grans (Abs): 0 10*3/uL (ref 0.0–0.1)
Immature Granulocytes: 0 %
Lymphocytes Absolute: 2.3 10*3/uL (ref 0.7–3.1)
Lymphs: 32 %
MCH: 30.3 pg (ref 26.6–33.0)
MCHC: 33.3 g/dL (ref 31.5–35.7)
MCV: 91 fL (ref 79–97)
Monocytes Absolute: 0.6 10*3/uL (ref 0.1–0.9)
Monocytes: 8 %
Neutrophils Absolute: 4.2 10*3/uL (ref 1.4–7.0)
Neutrophils: 57 %
Platelets: 277 10*3/uL (ref 150–450)
RBC: 5.02 x10E6/uL (ref 4.14–5.80)
RDW: 12.1 % (ref 11.6–15.4)
WBC: 7.4 10*3/uL (ref 3.4–10.8)

## 2023-04-13 LAB — COMPREHENSIVE METABOLIC PANEL
ALT: 24 IU/L (ref 0–44)
AST: 15 IU/L (ref 0–40)
Albumin: 4.3 g/dL (ref 4.1–5.1)
Alkaline Phosphatase: 100 IU/L (ref 44–121)
BUN/Creatinine Ratio: 16 (ref 9–20)
BUN: 15 mg/dL (ref 6–24)
Bilirubin Total: 0.7 mg/dL (ref 0.0–1.2)
CO2: 23 mmol/L (ref 20–29)
Calcium: 9.3 mg/dL (ref 8.7–10.2)
Chloride: 101 mmol/L (ref 96–106)
Creatinine, Ser: 0.91 mg/dL (ref 0.76–1.27)
Globulin, Total: 2.1 g/dL (ref 1.5–4.5)
Glucose: 90 mg/dL (ref 70–99)
Potassium: 4.9 mmol/L (ref 3.5–5.2)
Sodium: 136 mmol/L (ref 134–144)
Total Protein: 6.4 g/dL (ref 6.0–8.5)
eGFR: 107 mL/min/{1.73_m2} (ref >59)

## 2023-04-13 LAB — B12 AND FOLATE PANEL
Folate: 9.7 ng/mL (ref >3.0)
Vitamin B-12: 644 pg/mL (ref 232–1245)

## 2023-04-13 LAB — LIPID PANEL
Chol/HDL Ratio: 5.1 ratio — ABNORMAL HIGH (ref 0.0–5.0)
Cholesterol, Total: 210 mg/dL — ABNORMAL HIGH (ref 100–199)
HDL: 41 mg/dL (ref >39)
LDL Chol Calc (NIH): 152 mg/dL — ABNORMAL HIGH (ref 0–99)
Triglycerides: 94 mg/dL (ref 0–149)
VLDL Cholesterol Cal: 17 mg/dL (ref 5–40)

## 2023-04-13 LAB — VITAMIN D 25 HYDROXY (VIT D DEFICIENCY, FRACTURES): Vit D, 25-Hydroxy: 32.9 ng/mL (ref 30.0–100.0)

## 2023-04-13 LAB — TSH: TSH: 1.95 u[IU]/mL (ref 0.450–4.500)

## 2023-04-13 LAB — HGB A1C W/O EAG: Hgb A1c MFr Bld: 5.5 % (ref 4.8–5.6)

## 2023-04-16 ENCOUNTER — Encounter: Payer: Self-pay | Admitting: Nurse Practitioner

## 2023-05-08 ENCOUNTER — Encounter: Payer: Self-pay | Admitting: Nurse Practitioner

## 2023-05-08 ENCOUNTER — Ambulatory Visit (INDEPENDENT_AMBULATORY_CARE_PROVIDER_SITE_OTHER): Payer: 59 | Admitting: Nurse Practitioner

## 2023-05-08 VITALS — BP 136/78 | HR 93 | Temp 98.6°F | Ht 67.0 in | Wt 286.8 lb

## 2023-05-08 DIAGNOSIS — Z0000 Encounter for general adult medical examination without abnormal findings: Secondary | ICD-10-CM | POA: Diagnosis not present

## 2023-05-08 DIAGNOSIS — E785 Hyperlipidemia, unspecified: Secondary | ICD-10-CM | POA: Diagnosis not present

## 2023-05-08 DIAGNOSIS — I10 Essential (primary) hypertension: Secondary | ICD-10-CM | POA: Diagnosis not present

## 2023-05-08 DIAGNOSIS — Z6841 Body Mass Index (BMI) 40.0 and over, adult: Secondary | ICD-10-CM

## 2023-05-08 DIAGNOSIS — M62838 Other muscle spasm: Secondary | ICD-10-CM

## 2023-05-08 DIAGNOSIS — J4530 Mild persistent asthma, uncomplicated: Secondary | ICD-10-CM

## 2023-05-08 MED ORDER — ZEPBOUND 5 MG/0.5ML ~~LOC~~ SOAJ
5.0000 mg | SUBCUTANEOUS | 0 refills | Status: DC
Start: 2023-05-08 — End: 2023-08-09

## 2023-05-08 MED ORDER — ZEPBOUND 2.5 MG/0.5ML ~~LOC~~ SOAJ
2.5000 mg | SUBCUTANEOUS | 0 refills | Status: DC
Start: 2023-05-08 — End: 2023-08-09

## 2023-05-08 MED ORDER — CYCLOBENZAPRINE HCL 5 MG PO TABS
5.0000 mg | ORAL_TABLET | Freq: Two times a day (BID) | ORAL | 11 refills | Status: AC | PRN
Start: 2023-05-08 — End: ?

## 2023-05-08 MED ORDER — PRAVASTATIN SODIUM 20 MG PO TABS: 20.0000 mg | ORAL_TABLET | Freq: Every day | ORAL | 3 refills | Status: DC

## 2023-05-08 MED ORDER — ZEPBOUND 7.5 MG/0.5ML ~~LOC~~ SOAJ
7.5000 mg | SUBCUTANEOUS | 0 refills | Status: DC
Start: 2023-05-08 — End: 2023-09-05

## 2023-05-08 MED ORDER — ALBUTEROL SULFATE HFA 108 (90 BASE) MCG/ACT IN AERS
1.0000 | INHALATION_SPRAY | RESPIRATORY_TRACT | 11 refills | Status: AC | PRN
Start: 2023-05-08 — End: ?

## 2023-05-08 MED ORDER — AMLODIPINE BESYLATE 2.5 MG PO TABS
2.5000 mg | ORAL_TABLET | Freq: Two times a day (BID) | ORAL | 3 refills | Status: AC
Start: 2023-05-08 — End: ?

## 2023-05-08 MED ORDER — NEBIVOLOL HCL 5 MG PO TABS
5.0000 mg | ORAL_TABLET | Freq: Every day | ORAL | 3 refills | Status: DC
Start: 2023-05-08 — End: 2023-11-15

## 2023-05-08 NOTE — Progress Notes (Signed)
Bethanie Dicker, NP-C Phone: [phone]  Caleb [name] is a 42 y.o. male who presents today for annual exam. He has no complaints or new concerns today. He is doing well on all of his medications and is needing refills. He completed lab work last month.   Diet: Eats later at night due to work schedule, he does eat a lot of sugar, has a garden- fresh vegetables Exercise: Jiu jitsu 3 times per week Family history-  Prostate cancer: Yes, grandfather  Colon cancer: Yes, grandmother Sexually active: Yes Vaccines-   Flu: Not due  Tetanus: 04/30/2018  COVID19: Never HIV screening: Declined Hep C Screening: Declined Tobacco use: No Alcohol use: Yes, rarely Illicit Drug use: No Dentist: Yes Ophthalmology: Yes   Social History   Tobacco Use  Smoking Status Never  Smokeless Tobacco Former   Types: Chew    Current Outpatient Medications on File Prior to Visit  Medication Sig Dispense Refill   ascorbic acid (VITAMIN C) 500 MG tablet Take by mouth daily.     Cholecalciferol (VITAMIN D3 PO) Take by mouth. Takes 5000 units daily     clindamycin (CLEOCIN-T) 1 % lotion Apply 1 application topically daily. Apply after shower to affected areas. 60 mL 6   Multiple Vitamins-Minerals (ZINC PO) Take by mouth daily.     multivitamin (ONE-A-DAY MEN'S) TABS tablet Take 1 tablet by mouth daily.     QUERCETIN PO Take by mouth.     No current facility-administered medications on file prior to visit.    ROS see history of present illness  Objective  Physical Exam Vitals:   05/08/23 1516  BP: 136/78  Pulse: 93  Temp: 98.6 F (37 C)  SpO2: 96%    BP Readings from Last 3 Encounters:  05/08/23 136/78  04/03/23 130/86  05/03/22 (!) 140/100   Wt Readings from Last 3 Encounters:  05/08/23 286 lb 12.8 oz (130.1 kg)  04/03/23 287 lb 1.9 oz (130.2 kg)  05/03/22 296 lb 9.6 oz (134.5 kg)    Physical Exam Constitutional:      General: He is not in acute distress.    Appearance: Normal  appearance. He is obese.  HENT:     Head: Normocephalic.     Right Ear: Tympanic membrane normal.     Left Ear: Tympanic membrane normal.     Nose: Nose normal.     Mouth/Throat:     Mouth: Mucous membranes are moist.     Pharynx: Oropharynx is clear.  Eyes:     Conjunctiva/sclera: Conjunctivae normal.     Pupils: Pupils are equal, round, and reactive to light.  Neck:     Thyroid: No thyromegaly.  Cardiovascular:     Rate and Rhythm: Normal rate and regular rhythm.     Heart sounds: Normal heart sounds.  Pulmonary:     Effort: Pulmonary effort is normal.     Breath sounds: Normal breath sounds.  Abdominal:     General: Abdomen is flat. Bowel sounds are normal.     Palpations: Abdomen is soft. There is no mass.     Tenderness: There is no abdominal tenderness.  Musculoskeletal:        General: Normal range of motion.  Lymphadenopathy:     Cervical: No cervical adenopathy.  Skin:    General: Skin is warm and dry.     Findings: No rash.  Neurological:     General: No focal deficit present.     Mental Status: He is alert.  Psychiatric:        Mood and Affect: Mood normal.        Behavior: Behavior normal.    Assessment/Plan: Please see individual problem list.  Preventative health care Assessment & Plan: Physical exam complete. Lab results from last month reviewed. Flu vaccine not due. Tetanus vaccine- UTD. Declined all COVID vaccines. HIV and Hep C screenings declined. Recommended follow ups with Dentist and Ophthalmology for annual exams. Encouraged healthy diet and to continue exercising. Return to care in 3 months, sooner PRN.    Morbid obesity with BMI of 40.0-44.9, adult [facility]) Assessment & Plan: He has been trying to lose weight without success. He is interested in medications to help. He has tried to get injections in the past but his insurance would not cover. Will try to start the patient on Zepbound, pending insurance approval. Counseled on the risk of  pancreatitis and gallbladder disease. Discussed the risk of nausea. Advised to discontinue the Zepbound and contact us immediately if they develop abdominal pain. If they develop excessive nausea they will contact us right away. I discussed that medullary thyroid cancer has been seen in rats studies. The patient confirmed no personal or family history of thyroid cancer, parathyroid cancer, or adrenal gland cancer. Discussed that we thus far have not seen medullary thyroid cancer result from use of this type of medication in humans. Advised to monitor the thyroid area and contact us for any lumps, swelling, trouble swallowing, or any other changes in this area.  Discussed goal weight loss of 1 to 2 pounds a week while on this medication. Discussed the importance of healthy diet, exercise and lifestyle modifications even with this medication. Will continue to monitor.   Orders: -     Zepbound; Inject 2.5 mg into the skin once a week. X 4 weeks then increase  Dispense: 2 mL; Refill: 0 -     Zepbound; Inject 5 mg into the skin once a week. X 4 weeks then increase  Dispense: 2 mL; Refill: 0 -     Zepbound; Inject 7.5 mg into the skin once a week. X 4 weeks  Dispense: 2 mL; Refill: 0  Hypertension, unspecified type Assessment & Plan: Chronic. Not at goal today. He will continue his Bystolic 5 mg daily and Norvasc 2.5 mg BID. Refills sent. Advised to continue checking blood pressure daily at home and bring log to next appointment. He has been working on making dietary changes and trying to lose weight. Advised to decrease salt intake. Will continue to monitor.   Orders: -     amLODIPine Besylate; Take 1 tablet (2.5 mg total) by mouth 2 (two) times daily.  Dispense: 180 tablet; Refill: 3 -     Nebivolol HCl; Take 1 tablet (5 mg total) by mouth daily.  Dispense: 90 tablet; Refill: 3  Hyperlipidemia, unspecified hyperlipidemia type Assessment & Plan: Chronic. Restarted his Pravastatin 20 mg daily last month,  as he has not been taking it. Continue. Refills sent. Encouraged healthy diet and exercise.  Orders: -     Pravastatin Sodium; Take 1 tablet (20 mg total) by mouth daily.  Dispense: 90 tablet; Refill: 3  Mild persistent asthma, unspecified whether complicated Assessment & Plan: Chronic. Stable on Albuterol PRN. Not using frequently. Continue. Refills sent. Denies shortness of breath. Denies cough. Denies wheezing.   Orders: -     Albuterol Sulfate HFA; Inhale 1-2 puffs into the lungs every 4 (four) hours as needed for wheezing or shortness of breath.  Dispense: 18 g; Refill: 11  Muscle spasm -     Cyclobenzaprine HCl; Take 1 tablet (5 mg total) by mouth 2 (two) times daily as needed.  Dispense: 60 tablet; Refill: 11   Return in about 3 months (around 08/08/2023) for Follow up.   Bethanie Dicker, NP-C [facility] - [employer]

## 2023-05-09 NOTE — Assessment & Plan Note (Addendum)
He has been trying to lose weight without success. He is interested in medications to help. He has tried to get injections in the past but his insurance would not cover. Will try to start the patient on Zepbound, pending insurance approval. Counseled on the risk of pancreatitis and gallbladder disease. Discussed the risk of nausea. Advised to discontinue the Zepbound and contact us immediately if they develop abdominal pain. If they develop excessive nausea they will contact us right away. I discussed that medullary thyroid cancer has been seen in rats studies. The patient confirmed no personal or family history of thyroid cancer, parathyroid cancer, or adrenal gland cancer. Discussed that we thus far have not seen medullary thyroid cancer result from use of this type of medication in humans. Advised to monitor the thyroid area and contact us for any lumps, swelling, trouble swallowing, or any other changes in this area.  Discussed goal weight loss of 1 to 2 pounds a week while on this medication. Discussed the importance of healthy diet, exercise and lifestyle modifications even with this medication. Will continue to monitor.

## 2023-05-09 NOTE — Assessment & Plan Note (Signed)
Physical exam complete. Lab results from last month reviewed. Flu vaccine not due. Tetanus vaccine- UTD. Declined all COVID vaccines. HIV and Hep C screenings declined. Recommended follow ups with Dentist and Ophthalmology for annual exams. Encouraged healthy diet and to continue exercising. Return to care in 3 months, sooner PRN.

## 2023-05-09 NOTE — Assessment & Plan Note (Addendum)
Chronic. Stable on Albuterol PRN. Not using frequently. Continue. Refills sent. Denies shortness of breath. Denies cough. Denies wheezing.

## 2023-05-09 NOTE — Assessment & Plan Note (Signed)
Chronic. Restarted his Pravastatin 20 mg daily last month, as he has not been taking it. Continue. Refills sent. Encouraged healthy diet and exercise.

## 2023-05-09 NOTE — Assessment & Plan Note (Signed)
Chronic. Not at goal today. He will continue his Bystolic 5 mg daily and Norvasc 2.5 mg BID. Refills sent. Advised to continue checking blood pressure daily at home and bring log to next appointment. He has been working on making dietary changes and trying to lose weight. Advised to decrease salt intake. Will continue to monitor.

## 2023-05-13 ENCOUNTER — Other Ambulatory Visit: Payer: Self-pay | Admitting: Nurse Practitioner

## 2023-05-23 ENCOUNTER — Encounter: Payer: Self-pay | Admitting: Nurse Practitioner

## 2023-05-24 ENCOUNTER — Telehealth: Payer: Self-pay

## 2023-05-24 ENCOUNTER — Other Ambulatory Visit (HOSPITAL_COMMUNITY): Payer: Self-pay

## 2023-05-24 NOTE — Telephone Encounter (Signed)
Pt needs a PA for Zepbound  Provider Bethanie Dicker, NP

## 2023-05-24 NOTE — Telephone Encounter (Signed)
PA request has been Approved. New Encounter created for follow up. For additional info see Pharmacy Prior Auth telephone encounter from 05/24/23.

## 2023-05-24 NOTE — Telephone Encounter (Signed)
Pharmacy Patient Advocate Encounter  Received notification from [facility] ([place]) that Prior Authorization for Zepbound 2.5MG /0.5ML pen-injectors has been APPROVED from 05/16/23 to 11/16/23. Ran test claim, Copay is $0  PA #/Case ID/Reference #: : [id number]

## 2023-05-31 NOTE — Telephone Encounter (Signed)
Pt called stating the pharmacy is not filling the zepbound and need to know what [facility] to [facility] it

## 2023-05-31 NOTE — Telephone Encounter (Signed)
Detailed VM left informing pt that I spoke with pharmacy and they ran the zepbound with his insurance and it is free, they will be working on getting it filled and if they have to order it they may have it by Monday, if pt does not want to wait until Monday pt can [facility] [address] [facility].

## 2023-06-24 ENCOUNTER — Encounter: Payer: Self-pay | Admitting: Nurse Practitioner

## 2023-06-25 ENCOUNTER — Other Ambulatory Visit (HOSPITAL_COMMUNITY): Payer: Self-pay

## 2023-06-25 ENCOUNTER — Telehealth: Payer: Self-pay

## 2023-06-25 NOTE — Telephone Encounter (Signed)
Pt informed via mychart

## 2023-06-25 NOTE — Telephone Encounter (Signed)
Pt sent a mychart msg stating his pharmacy needs a PA for his next dose of Zepbound . Pt would like a PA done for all doses. He was already approved for the first dose of 2.5mg . not sure if a PA is actually needed

## 2023-06-25 NOTE — Telephone Encounter (Signed)
Pharmacy Patient Advocate Encounter   Received notification from Pt Calls Messages that prior authorization for Zepbound is required/requested.   Insurance verification completed.   The patient is insured through [facility] .   Per test claim: The current 28 day co-pay is, $0.  No PA needed at this time. This test claim was processed through [facility]- copay amounts may vary at other pharmacies due to pharmacy/plan contracts, or as the patient moves through the different stages of their insurance plan.

## 2023-07-02 ENCOUNTER — Other Ambulatory Visit (HOSPITAL_COMMUNITY): Payer: Self-pay

## 2023-08-09 ENCOUNTER — Ambulatory Visit (INDEPENDENT_AMBULATORY_CARE_PROVIDER_SITE_OTHER): Payer: 59 | Admitting: Nurse Practitioner

## 2023-08-09 ENCOUNTER — Encounter: Payer: Self-pay | Admitting: Nurse Practitioner

## 2023-08-09 VITALS — BP 130/90 | HR 95 | Temp 98.7°F | Ht 67.0 in | Wt 275.2 lb

## 2023-08-09 DIAGNOSIS — Z6841 Body Mass Index (BMI) 40.0 and over, adult: Secondary | ICD-10-CM

## 2023-08-09 DIAGNOSIS — I10 Essential (primary) hypertension: Secondary | ICD-10-CM | POA: Diagnosis not present

## 2023-08-09 DIAGNOSIS — E785 Hyperlipidemia, unspecified: Secondary | ICD-10-CM

## 2023-08-09 MED ORDER — ZEPBOUND 12.5 MG/0.5ML ~~LOC~~ SOAJ
12.5000 mg | SUBCUTANEOUS | 0 refills | Status: DC
Start: 2023-08-09 — End: 2023-11-15

## 2023-08-09 MED ORDER — ZEPBOUND 10 MG/0.5ML ~~LOC~~ SOAJ
10.0000 mg | SUBCUTANEOUS | 0 refills | Status: DC
Start: 2023-08-09 — End: 2023-11-15

## 2023-08-09 MED ORDER — ZEPBOUND 15 MG/0.5ML ~~LOC~~ SOAJ: 15.0000 mg | SUBCUTANEOUS | 1 refills | Status: DC

## 2023-08-09 NOTE — Progress Notes (Signed)
Bethanie Dicker, NP-C Phone: [phone]  Caleb [name] is a 42 y.o. male who presents today for follow up.   Obesity- Patient started on Zepbound in August. He just completed his first dose of 7.5 mg weekly. He denies any side effects. He has noticed a decreased appetite and decrease in food cravings. He has cut out bread and fried foods. He has increased his protein intake. He continues to do martial arts 3 days per week. He has lost 11 pounds since his last visit.   HYPERTENSION- Patient has not been taking his blood pressure medications for the last 4 weeks. He has started taking TMG supplements and noticed that his blood pressure is better than it was when on his medications.  Disease Monitoring Home BP Monitoring- 120s/80s Chest pain- No    Dyspnea- No Medications Compliance-  None. Lightheadedness-  No  Edema- No BMET    Component Value Date/Time   NA 136 04/12/2023 0937   K 4.9 04/12/2023 0937   CL 101 04/12/2023 0937   CO2 23 04/12/2023 0937   GLUCOSE 90 04/12/2023 0937   GLUCOSE 101 (H) 12/24/2018 2224   BUN 15 04/12/2023 0937   CREATININE 0.91 04/12/2023 0937   CALCIUM 9.3 04/12/2023 0937   GFRNONAA 100 12/18/2019 1558   GFRAA 115 12/18/2019 1558   HYPERLIPIDEMIA- Patient no longer taking Pravastatin due to experiencing adverse side effects. Symptoms Chest pain on exertion:  No   Leg claudication:   No Medications: Compliance- None Right upper quadrant pain- No  Muscle aches- No Lipid Panel     Component Value Date/Time   CHOL 210 (H) 04/12/2023 0937   CHOL 176 07/12/2015 0951   TRIG 94 04/12/2023 0937   TRIG 164 (H) 07/12/2015 0951   HDL 41 04/12/2023 0937   CHOLHDL 5.1 (H) 04/12/2023 0937   CHOLHDL 4 08/13/2016 0918   VLDL 17.8 08/13/2016 0918   VLDL 33 (H) 07/12/2015 0951   LDLCALC 152 (H) 04/12/2023 0937   LABVLDL 17 04/12/2023 0937     Social History   Tobacco Use  Smoking Status Never  Smokeless Tobacco Former   Types: Chew    Current  Outpatient Medications on File Prior to Visit  Medication Sig Dispense Refill   albuterol (VENTOLIN HFA) 108 (90 Base) MCG/ACT inhaler Inhale 1-2 puffs into the lungs every 4 (four) hours as needed for wheezing or shortness of breath. 18 g 11   amLODipine (NORVASC) 2.5 MG tablet Take 1 tablet (2.5 mg total) by mouth 2 (two) times daily. 180 tablet 3   ascorbic acid (VITAMIN C) 500 MG tablet Take by mouth daily.     Cholecalciferol (VITAMIN D3 PO) Take by mouth. Takes 5000 units daily     clindamycin (CLEOCIN-T) 1 % lotion Apply 1 application topically daily. Apply after shower to affected areas. 60 mL 6   cyclobenzaprine (FLEXERIL) 5 MG tablet Take 1 tablet (5 mg total) by mouth 2 (two) times daily as needed. 60 tablet 11   Multiple Vitamins-Minerals (ZINC PO) Take by mouth daily.     multivitamin (ONE-A-DAY MEN'S) TABS tablet Take 1 tablet by mouth daily.     nebivolol (BYSTOLIC) 5 MG tablet Take 1 tablet (5 mg total) by mouth daily. 90 tablet 3   QUERCETIN PO Take by mouth.     tirzepatide (ZEPBOUND) 7.5 MG/0.5ML Pen Inject 7.5 mg into the skin once a week. X 4 weeks 2 mL 0   No current facility-administered medications on file prior to visit.  ROS see history of present illness  Objective  Physical Exam Vitals:   08/09/23 1502 08/09/23 1532  BP: (!) 140/88 (!) 130/90  Pulse: 95   Temp: 98.7 F (37.1 C)   SpO2: 96%     BP Readings from Last 3 Encounters:  08/09/23 (!) 130/90  05/08/23 136/78  04/03/23 130/86   Wt Readings from Last 3 Encounters:  08/09/23 275 lb 3.2 oz (124.8 kg)  05/08/23 286 lb 12.8 oz (130.1 kg)  04/03/23 287 lb 1.9 oz (130.2 kg)    Physical Exam Constitutional:      General: He is not in acute distress.    Appearance: Normal appearance. He is obese.  HENT:     Head: Normocephalic.  Cardiovascular:     Rate and Rhythm: Normal rate and regular rhythm.     Heart sounds: Normal heart sounds.  Pulmonary:     Effort: Pulmonary effort is normal.      Breath sounds: Normal breath sounds.  Skin:    General: Skin is warm and dry.  Neurological:     General: No focal deficit present.     Mental Status: He is alert.  Psychiatric:        Mood and Affect: Mood normal.        Behavior: Behavior normal.     Assessment/Plan: Please see individual problem list.  Morbid obesity with BMI of 40.0-44.9, adult [facility]) Assessment & Plan: Doing well on Zepbound. He will continue 7.5 mg weekly for the next 3 weeks then increase. Increased doses sent in for patient to continue titration. He will contact if he starts to experience any adverse side effects. Encouraged to continue healthy diet and exercise. Anticipate more weight loss with increased doses. Will continue to monitor.   Orders: -     Zepbound; Inject 10 mg into the skin once a week.  Dispense: 2 mL; Refill: 0 -     Zepbound; Inject 12.5 mg into the skin once a week.  Dispense: 2 mL; Refill: 0 -     Zepbound; Inject 15 mg into the skin once a week.  Dispense: 6 mL; Refill: 1  Primary hypertension Assessment & Plan: Chronic issue. Elevated reading x 2 today in office. He has not been taking his blood pressure medications for the last 4 weeks. He is now taking TMG supplements to help with his blood pressure and liver function. He has been monitoring his blood pressure daily at home with reported stable numbers- 120s/80s. He believes the supplements are working better than the medications were. He will continue to monitor his blood pressure at home, BP log provided to patient and will contact if his numbers are consistently elevated. Will continue to monitor.    Hyperlipidemia, unspecified hyperlipidemia type Assessment & Plan: Chronic. No longer taking his Pravastatin due to side effects. He will continue working on healthy diet and exercise. Anticipate improvement with weight loss. The 10-year ASCVD risk score (Arnett DK, et al., 2019) is: 3.1%. Will plan to check lipid panel at next  appointment. Will continue to monitor.     Return in about 3 months (around 11/09/2023) for Follow up.   Bethanie Dicker, NP-C [facility] - [employer]

## 2023-08-09 NOTE — Assessment & Plan Note (Signed)
Chronic issue. Elevated reading x 2 today in office. He has not been taking his blood pressure medications for the last 4 weeks. He is now taking TMG supplements to help with his blood pressure and liver function. He has been monitoring his blood pressure daily at home with reported stable numbers- 120s/80s. He believes the supplements are working better than the medications were. He will continue to monitor his blood pressure at home, BP log provided to patient and will contact if his numbers are consistently elevated. Will continue to monitor.

## 2023-08-09 NOTE — Assessment & Plan Note (Signed)
Chronic. No longer taking his Pravastatin due to side effects. He will continue working on healthy diet and exercise. Anticipate improvement with weight loss. The 10-year ASCVD risk score (Arnett DK, et al., 2019) is: 3.1%. Will plan to check lipid panel at next appointment. Will continue to monitor.

## 2023-08-09 NOTE — Assessment & Plan Note (Addendum)
Doing well on Zepbound. He will continue 7.5 mg weekly for the next 3 weeks then increase. Increased doses sent in for patient to continue titration. He will contact if he starts to experience any adverse side effects. Encouraged to continue healthy diet and exercise. Anticipate more weight loss with increased doses. Will continue to monitor.

## 2023-09-04 ENCOUNTER — Encounter: Payer: Self-pay | Admitting: Nurse Practitioner

## 2023-09-05 ENCOUNTER — Other Ambulatory Visit: Payer: Self-pay | Admitting: Nurse Practitioner

## 2023-09-05 MED ORDER — ZEPBOUND 7.5 MG/0.5ML ~~LOC~~ SOAJ: 7.5000 mg | SUBCUTANEOUS | 0 refills | Status: DC

## 2023-10-07 ENCOUNTER — Telehealth: Payer: 59 | Admitting: Physician Assistant

## 2023-10-07 DIAGNOSIS — A084 Viral intestinal infection, unspecified: Secondary | ICD-10-CM

## 2023-10-07 MED ORDER — ONDANSETRON HCL 4 MG PO TABS: 4.0000 mg | ORAL_TABLET | Freq: Three times a day (TID) | ORAL | 0 refills | Status: DC | PRN

## 2023-10-07 MED ORDER — DICYCLOMINE HCL 10 MG PO CAPS: 10.0000 mg | ORAL_CAPSULE | Freq: Three times a day (TID) | ORAL | 0 refills | Status: DC

## 2023-10-07 NOTE — Patient Instructions (Signed)
Doylene Canning, thank you for joining Margaretann Loveless, PA-C for today's virtual visit.  While this provider is not your primary care provider (PCP), if your PCP is located in our provider database this encounter information will be shared with them immediately following your visit.   A [facility] MyChart account gives you access to today's visit and all your visits, tests, and labs performed at [facility] " click here if you don't have a [facility] MyChart account or go to [email or web address]  Consent: (Patient) Caleb [name] provided verbal consent for this virtual visit at the beginning of the encounter.  Current Medications:  Current Outpatient Medications:    dicyclomine (BENTYL) 10 MG capsule, Take 1 capsule (10 mg total) by mouth 4 (four) times daily -  before meals and at bedtime for 7 days., Disp: 28 capsule, Rfl: 0   ondansetron (ZOFRAN) 4 MG tablet, Take 1 tablet (4 mg total) by mouth every 8 (eight) hours as needed for nausea or vomiting., Disp: 20 tablet, Rfl: 0   albuterol (VENTOLIN HFA) 108 (90 Base) MCG/ACT inhaler, Inhale 1-2 puffs into the lungs every 4 (four) hours as needed for wheezing or shortness of breath., Disp: 18 g, Rfl: 11   amLODipine (NORVASC) 2.5 MG tablet, Take 1 tablet (2.5 mg total) by mouth 2 (two) times daily., Disp: 180 tablet, Rfl: 3   ascorbic acid (VITAMIN C) 500 MG tablet, Take by mouth daily., Disp: , Rfl:    Cholecalciferol (VITAMIN D3 PO), Take by mouth. Takes 5000 units daily, Disp: , Rfl:    clindamycin (CLEOCIN-T) 1 % lotion, Apply 1 application topically daily. Apply after shower to affected areas., Disp: 60 mL, Rfl: 6   cyclobenzaprine (FLEXERIL) 5 MG tablet, Take 1 tablet (5 mg total) by mouth 2 (two) times daily as needed., Disp: 60 tablet, Rfl: 11   Multiple Vitamins-Minerals (ZINC PO), Take by mouth daily., Disp: , Rfl:    multivitamin (ONE-A-DAY MEN'S) TABS tablet, Take 1 tablet by mouth daily., Disp: , Rfl:     nebivolol (BYSTOLIC) 5 MG tablet, Take 1 tablet (5 mg total) by mouth daily., Disp: 90 tablet, Rfl: 3   QUERCETIN PO, Take by mouth., Disp: , Rfl:    tirzepatide (ZEPBOUND) 10 MG/0.5ML Pen, Inject 10 mg into the skin once a week., Disp: 2 mL, Rfl: 0   tirzepatide (ZEPBOUND) 12.5 MG/0.5ML Pen, Inject 12.5 mg into the skin once a week., Disp: 2 mL, Rfl: 0   tirzepatide (ZEPBOUND) 15 MG/0.5ML Pen, Inject 15 mg into the skin once a week., Disp: 6 mL, Rfl: 1   tirzepatide (ZEPBOUND) 7.5 MG/0.5ML Pen, Inject 7.5 mg into the skin once a week. X 4 weeks, Disp: 2 mL, Rfl: 0   Medications ordered in this encounter:  Meds ordered this encounter  Medications   ondansetron (ZOFRAN) 4 MG tablet    Sig: Take 1 tablet (4 mg total) by mouth every 8 (eight) hours as needed for nausea or vomiting.    Dispense:  20 tablet    Refill:  0    Supervising Provider:   Merrilee Jansky [[id number]]   dicyclomine (BENTYL) 10 MG capsule    Sig: Take 1 capsule (10 mg total) by mouth 4 (four) times daily -  before meals and at bedtime for 7 days.    Dispense:  28 capsule    Refill:  0    Supervising Provider:   Merrilee Jansky [phone]     *If you need  refills on other medications prior to your next appointment, please contact your pharmacy*  Follow-Up: Call back or seek an in-person evaluation if the symptoms worsen or if the condition fails to improve as anticipated.  [facility] [phone]  Other Instructions  Norovirus Infection Norovirus infection causes inflammation in the stomach and intestines (gastroenteritis) and food poisoning. It is caused by exposure to a virus from a group of similar viruses called noroviruses. Norovirus spreads very easily from person to person (is very contagious). It often occurs in places where people are in close contact, such as schools, nursing homes, restaurants, and cruise ships. You can get it from food, water, surfaces, or other people who have the virus.  Norovirus is also found in the stool (feces) or vomit of infected people. You can spread the infection as soon as you feel sick, and you may continue to be contagious after you recover. What are the causes? This condition is caused by contact with norovirus. You can catch norovirus if you: Eat or drink something that is contaminated with norovirus. Touch surfaces or objects that are contaminated with norovirus and then put your hand in or by your mouth or nose. Have direct contact with an infected person who may or may not still have symptoms. Share food, drink, or utensils with someone who is contagious with norovirus. What are the signs or symptoms? Symptoms usually begin within 12 hours to 2 days after you become infected. Most norovirus symptoms affect the digestive system.Symptoms may include: Nausea, vomiting, and diarrhea. Stomach cramps. Fever. Chills. Headache. Muscle aches and tiredness. How is this diagnosed? This condition may be diagnosed based on: Your symptoms. A physical exam. A stool test. How is this treated? There is no specific treatment for norovirus. Most people get better without treatment in about 2 days. Young children, the elderly, and people who are already sick may take up to 6 days to recover. Follow these instructions at home:  Eating and drinking  Drink plenty of water to replace fluids that are lost through diarrhea and vomiting. This prevents dehydration. Drink enough fluid to keep your urine pale yellow. Drink clear fluids in small amounts as you are able. Clear fluids include water, ice chips, fruit juice with water added (diluted fruit juice), and low-calorie sports drinks. Avoid fluids that contain a lot of sugar or caffeine, such as energy drinks, sports drinks, and soda. Avoid alcohol. If instructed by your health care provider, drink an oral rehydration solution (ORS). This is a drink that is sold at pharmacies and retail stores. An ORS contains  minerals (electrolytes) that you can lose through diarrhea and vomiting. Eat bland, easy-to-digest foods in small amounts as you are able. These foods include rice, lean meats, toast, and crackers. Avoid spicy or fatty foods. General instructions Rest at home while you recover. Do not prepare food for others while you are infected. Wait at least 3 days after you recover from the illness to do this. Take over-the-counter and prescription medicines only as told by your health care provider. Wash your hands frequently with soap and water for at least 20 seconds. Alcohol-based hand sanitizer can be used in addition to soap and water, but sanitizer should not be the only cleansing method because it is not effective at removing norovirus from your hands or surfaces. Make sure that each person in your household washes his or her hands well and often. Keep all follow-up visits. This is important. How is this prevented? To  help prevent the spread of norovirus: Stay at home if you are feeling sick. This will reduce the risk of spreading the virus to others. Wash your hands often with soap and water for at least 20 seconds, especially after using the toilet, helping a child use the toilet, or changing a child's diaper. Wash fruits and vegetables thoroughly before peeling, preparing, or serving them. Throw out any food that a sick person may have touched. Disinfect contaminated surfaces immediately after someone in the household has been sick. Disinfect frequently used surfaces, such as counters, doorknobs, and faucets. Use a bleach-based household cleaner. Immediately remove and wash soiled clothes or sheets. Contact a health care provider if: You have vomiting, diarrhea, or stomach pain that gets worse. You have symptoms that do not go away after 3-6 days. You have a fever. You cannot drink without vomiting. You feel light-headed or dizzy. Your symptoms get worse. Get help right away if: You develop  symptoms of dehydration that do not improve with fluid replacement, such as: Excessive sleepiness. Lack of tears. Very little urine production. Dry mouth. Muscle cramps. Weak pulse. Confusion. Summary Norovirus infection is common and often occurs in places where people are in close contact, such as schools, nursing homes, restaurants, and cruise ships. To help prevent the spread of this infection, wash hands with soap and water for at least 20 seconds before handling food or after having contact with stool or body fluids. There is no specific treatment for norovirus, but most people get better without treatment in about 2 days. People who are healthy when infected often recover sooner than those who are elderly, young, or already sick. Replace lost fluids by drinking plenty of water, or by drinking oral rehydration solution (ORS), which contains important minerals called electrolytes. This prevents dehydration. This information is not intended to replace advice given to you by your health care provider. Make sure you discuss any questions you have with your health care provider. Document Revised: 05/17/2021 Document Reviewed: 05/17/2021 Elsevier Patient Education  2024 Elsevier Inc.    If you have been instructed to have an in-person evaluation today at a local Urgent Care facility, please use the link below. It will take you to a list of all of our available [facility], including address, phone number and hours of operation. Please do not delay care.  [facility]  If you or a family member do not have a primary care provider, use the link below to schedule a visit and establish care. When you choose a [facility] primary care physician or advanced practice provider, you gain a long-term partner in health. Find a Primary Care Provider  Learn more about [facility] in-office and virtual care options: [facility] - Get Care Now

## 2023-10-07 NOTE — Progress Notes (Signed)
Virtual Visit Consent   Caleb [name], you are scheduled for a virtual visit with a [facility] provider today. Just as with appointments in the office, your consent must be obtained to participate. Your consent will be active for this visit and any virtual visit you may have with one of our providers in the next 365 days. If you have a MyChart account, a copy of this consent can be sent to you electronically.  As this is a virtual visit, video technology does not allow for your provider to perform a traditional examination. This may limit your provider's ability to fully assess your condition. If your provider identifies any concerns that need to be evaluated in person or the need to arrange testing (such as labs, EKG, etc.), we will make arrangements to do so. Although advances in technology are sophisticated, we cannot ensure that it will always work on either your end or our end. If the connection with a video visit is poor, the visit may have to be switched to a telephone visit. With either a video or telephone visit, we are not always able to ensure that we have a secure connection.  By engaging in this virtual visit, you consent to the provision of healthcare and authorize for your insurance to be billed (if applicable) for the services provided during this visit. Depending on your insurance coverage, you may receive a charge related to this service.  I need to obtain your verbal consent now. Are you willing to proceed with your visit today? Caleb [name] has provided verbal consent on 10/07/2023 for a virtual visit (video or telephone). Margaretann Loveless, PA-C  Date: 10/07/2023 11:03 AM  Virtual Visit via Video Note   I, Margaretann Loveless, connected with  Caleb [name]  ([id number], 1981/09/30) on 10/07/23 at 11:00 AM EST by a video-enabled telemedicine application and verified that I am speaking with the correct person using two identifiers.  Location: Patient: Virtual Visit Location  Patient: Home Provider: Virtual Visit Location Provider: Home Office   I discussed the limitations of evaluation and management by telemedicine and the availability of in person appointments. The patient expressed understanding and agreed to proceed.    History of Present Illness: Caleb [name] is a 42 y.o. who identifies as a male who was assigned male at birth, and is being seen today for stomach cramping and diarrhea.  HPI: GI Problem The primary symptoms include abdominal pain, nausea and diarrhea (watery). Primary symptoms do not include fever, fatigue, vomiting, melena, hematemesis, hematochezia or myalgias. The illness began yesterday. The onset was sudden. The problem has not changed since onset. The illness is also significant for bloating and tenesmus. The illness does not include chills, anorexia or constipation.   Started at a kid's birthday party Has not tried any medications.    Problems:  Patient Active Problem List   Diagnosis Date Noted   Vitamin D deficiency 04/10/2023   BMI 45.0-49.9, adult (HCC) 05/08/2022   DDD (degenerative disc disease), lumbar 04/26/2021   Chronic fatigue 11/09/2019   Chronic pain of right knee 11/09/2019   Snoring 11/09/2019   Asthma 11/06/2019   Lumbosacral radiculopathy at L5 08/12/2019   Neuropathy 08/12/2019   Morbid obesity with BMI of 40.0-44.9, adult (HCC) 01/06/2019   Fatty liver 01/29/2018   Nocturnal leg cramps 12/04/2017   Polyarthralgia 12/28/2016   Plantar fasciitis 12/04/2015   Preventative health care 12/04/2015   Hypertension 06/16/2015   Hyperlipidemia 06/16/2015   Obstructive sleep  apnea 06/16/2015    Allergies:  Allergies  Allergen Reactions   Gabapentin     Felt back and didn't work    Lisinopril     Cough    Amoxicillin     Itching/crawling sensation     Medications:  Current Outpatient Medications:    dicyclomine (BENTYL) 10 MG capsule, Take 1 capsule (10 mg total) by mouth 4 (four) times daily -   before meals and at bedtime for 7 days., Disp: 28 capsule, Rfl: 0   ondansetron (ZOFRAN) 4 MG tablet, Take 1 tablet (4 mg total) by mouth every 8 (eight) hours as needed for nausea or vomiting., Disp: 20 tablet, Rfl: 0   albuterol (VENTOLIN HFA) 108 (90 Base) MCG/ACT inhaler, Inhale 1-2 puffs into the lungs every 4 (four) hours as needed for wheezing or shortness of breath., Disp: 18 g, Rfl: 11   amLODipine (NORVASC) 2.5 MG tablet, Take 1 tablet (2.5 mg total) by mouth 2 (two) times daily., Disp: 180 tablet, Rfl: 3   ascorbic acid (VITAMIN C) 500 MG tablet, Take by mouth daily., Disp: , Rfl:    Cholecalciferol (VITAMIN D3 PO), Take by mouth. Takes 5000 units daily, Disp: , Rfl:    clindamycin (CLEOCIN-T) 1 % lotion, Apply 1 application topically daily. Apply after shower to affected areas., Disp: 60 mL, Rfl: 6   cyclobenzaprine (FLEXERIL) 5 MG tablet, Take 1 tablet (5 mg total) by mouth 2 (two) times daily as needed., Disp: 60 tablet, Rfl: 11   Multiple Vitamins-Minerals (ZINC PO), Take by mouth daily., Disp: , Rfl:    multivitamin (ONE-A-DAY MEN'S) TABS tablet, Take 1 tablet by mouth daily., Disp: , Rfl:    nebivolol (BYSTOLIC) 5 MG tablet, Take 1 tablet (5 mg total) by mouth daily., Disp: 90 tablet, Rfl: 3   QUERCETIN PO, Take by mouth., Disp: , Rfl:    tirzepatide (ZEPBOUND) 10 MG/0.5ML Pen, Inject 10 mg into the skin once a week., Disp: 2 mL, Rfl: 0   tirzepatide (ZEPBOUND) 12.5 MG/0.5ML Pen, Inject 12.5 mg into the skin once a week., Disp: 2 mL, Rfl: 0   tirzepatide (ZEPBOUND) 15 MG/0.5ML Pen, Inject 15 mg into the skin once a week., Disp: 6 mL, Rfl: 1   tirzepatide (ZEPBOUND) 7.5 MG/0.5ML Pen, Inject 7.5 mg into the skin once a week. X 4 weeks, Disp: 2 mL, Rfl: 0  Observations/Objective: Patient is well-developed, well-nourished in no acute distress.  Resting comfortably at home.  Head is normocephalic, atraumatic.  No labored breathing.  Speech is clear and coherent with logical content.   Patient is alert and oriented at baseline.    Assessment and Plan: 1. Viral gastroenteritis (Primary) - ondansetron (ZOFRAN) 4 MG tablet; Take 1 tablet (4 mg total) by mouth every 8 (eight) hours as needed for nausea or vomiting.  Dispense: 20 tablet; Refill: 0 - dicyclomine (BENTYL) 10 MG capsule; Take 1 capsule (10 mg total) by mouth 4 (four) times daily -  before meals and at bedtime for 7 days.  Dispense: 28 capsule; Refill: 0  - Suspect viral gastroenteritis - Zofran for nausea - Bentyl for cramping and diarrhea - Push fluids, electrolyte beverages - Liquid diet, then increase to soft/bland (BRAT) diet over next day, then increase diet as tolerated - Seek in person evaluation if not improving or symptoms worsen   Follow Up Instructions: I discussed the assessment and treatment plan with the patient. The patient was provided an opportunity to ask questions and all were answered. The  patient agreed with the plan and demonstrated an understanding of the instructions.  A copy of instructions were sent to the patient via MyChart unless otherwise noted below.    The patient was advised to call back or seek an in-person evaluation if the symptoms worsen or if the condition fails to improve as anticipated.    Margaretann Loveless, PA-C

## 2023-10-15 ENCOUNTER — Ambulatory Visit
Admission: RE | Admit: 2023-10-15 | Discharge: 2023-10-15 | Disposition: A | Discharge status: Home / Self Care | Payer: 59 | Source: Ambulatory Visit | Attending: Nurse Practitioner | Admitting: Nurse Practitioner

## 2023-10-15 VITALS — BP 132/93 | HR 103 | Temp 99.1°F | Resp 16

## 2023-10-15 DIAGNOSIS — R1084 Generalized abdominal pain: Secondary | ICD-10-CM | POA: Diagnosis not present

## 2023-10-15 DIAGNOSIS — R112 Nausea with vomiting, unspecified: Secondary | ICD-10-CM

## 2023-10-15 NOTE — Discharge Instructions (Addendum)
Take the previously prescribed antinausea medication as directed.    Keep yourself hydrated with clear liquids, such as water.    Go to the emergency department if you have worsening symptoms.    Follow up with your primary care provider.

## 2023-10-15 NOTE — ED Triage Notes (Addendum)
Patient to Urgent Care with complaints of two episodes of vomiting. Denies any known fevers.  Symptoms started yesterday. Reports two weeks ago he was treated for the norovirus w/ bentyl/ zofran.    Uses zepbound.

## 2023-10-15 NOTE — ED Provider Notes (Signed)
Caleb [name]    CSN: [id number] Arrival date & time: 10/15/23  1331      History   Chief Complaint Chief Complaint  Patient presents with   Abdominal Pain    Sharp abdominal pain, nausea, vomiting, fatigue - Entered by patient    HPI Caleb [name] is a 42 y.o. male.  Patient presents with abdominal pain yesterday afternoon which has since resolved.  He had 2 episodes of emesis during the night but none today.  He has been able to eat and drink today without emesis.  Last bowel movement yesterday and was "normal."  No fever or abdominal pain.  No recent travel out of the country.  No recent antibiotic use.  No OTC medicines taken today.  Patient states he has Zofran at home from previous illness but has not needed it today.  Patient had a video visit on 10/07/2023; diagnosed with viral gastroenteritis; treated with Bentyl and Zofran.  He is on Zepbound for weight loss.  His medical history includes morbid obesity, hypertension, hyperlipidemia, asthma, obstructive sleep apnea, lumbosacral radiculopathy, neuropathy.  The history is provided by the patient and medical records.    Past Medical History:  Diagnosis Date   Asthma 11/06/2019   Chicken pox    Hyperlipidemia    Hypertension    Nocturnal leg cramps 12/04/2017   Obesity    Obstructive sleep apnea     Patient Active Problem List   Diagnosis Date Noted   Vitamin D deficiency 04/10/2023   BMI 45.0-49.9, adult (HCC) 05/08/2022   DDD (degenerative disc disease), lumbar 04/26/2021   Chronic fatigue 11/09/2019   Chronic pain of right knee 11/09/2019   Snoring 11/09/2019   Asthma 11/06/2019   Lumbosacral radiculopathy at L5 08/12/2019   Neuropathy 08/12/2019   Morbid obesity with BMI of 40.0-44.9, adult (HCC) 01/06/2019   Fatty liver 01/29/2018   Nocturnal leg cramps 12/04/2017   Polyarthralgia 12/28/2016   Plantar fasciitis 12/04/2015   Preventative health care 12/04/2015   Hypertension 06/16/2015    Hyperlipidemia 06/16/2015   Obstructive sleep apnea 06/16/2015    Past Surgical History:  Procedure Laterality Date   WISDOM TOOTH EXTRACTION         Home Medications    Prior to Admission medications   Medication Sig Start Date End Date Taking? Authorizing Provider  albuterol (VENTOLIN HFA) 108 (90 Base) MCG/ACT inhaler Inhale 1-2 puffs into the lungs every 4 (four) hours as needed for wheezing or shortness of breath. 05/08/23   Bethanie Dicker, NP  amLODipine (NORVASC) 2.5 MG tablet Take 1 tablet (2.5 mg total) by mouth 2 (two) times daily. 05/08/23   Bethanie Dicker, NP  ascorbic acid (VITAMIN C) 500 MG tablet Take by mouth daily.    [provider]  Cholecalciferol (VITAMIN D3 PO) Take by mouth. Takes 5000 units daily    [provider]  clindamycin (CLEOCIN-T) 1 % lotion Apply 1 application topically daily. Apply after shower to affected areas. 11/21/21   Willeen Niece, MD  cyclobenzaprine (FLEXERIL) 5 MG tablet Take 1 tablet (5 mg total) by mouth 2 (two) times daily as needed. 05/08/23   Bethanie Dicker, NP  dicyclomine (BENTYL) 10 MG capsule Take 1 capsule (10 mg total) by mouth 4 (four) times daily -  before meals and at bedtime for 7 days. 10/07/23 10/14/23  Margaretann Loveless, PA-C  Multiple Vitamins-Minerals (ZINC PO) Take by mouth daily.    [provider]  multivitamin (ONE-A-DAY MEN'S) TABS tablet Take  1 tablet by mouth daily.    [provider]  nebivolol (BYSTOLIC) 5 MG tablet Take 1 tablet (5 mg total) by mouth daily. 05/08/23   Bethanie Dicker, NP  ondansetron (ZOFRAN) 4 MG tablet Take 1 tablet (4 mg total) by mouth every 8 (eight) hours as needed for nausea or vomiting. 10/07/23   Margaretann Loveless, PA-C  QUERCETIN PO Take by mouth.    [provider]  tirzepatide (ZEPBOUND) 10 MG/0.5ML Pen Inject 10 mg into the skin once a week. 08/09/23   Bethanie Dicker, NP  tirzepatide (ZEPBOUND) 12.5 MG/0.5ML Pen Inject 12.5 mg into the skin once a  week. 08/09/23   Bethanie Dicker, NP  tirzepatide [facility]) 15 MG/0.5ML Pen Inject 15 mg into the skin once a week. 08/09/23   Bethanie Dicker, NP  tirzepatide (ZEPBOUND) 7.5 MG/0.5ML Pen Inject 7.5 mg into the skin once a week. X 4 weeks 09/05/23   Bethanie Dicker, NP    Family History Family History  Problem Relation Age of Onset   Hypertension Mother    Other Mother        fatty liver   Diabetes Father    Cancer Maternal Grandmother        colon, liver   Hypertension Maternal Grandmother    Other Maternal Grandmother        brain anerusym, mini stroke   Cancer Maternal Grandfather        prostate in 48s   Hypertension Maternal Grandfather    Anemia Paternal Grandmother    Cancer Paternal Grandfather        leukemia coal minor   Hypertension Maternal Aunt     Social History Social History   Tobacco Use   Smoking status: Never   Smokeless tobacco: Former    Types: Associate Professor status: Never Used  Substance Use Topics   Alcohol use: Not Currently    Alcohol/week: 2.0 standard drinks of alcohol    Comment: on occasion   Drug use: No     Allergies   Gabapentin, Lisinopril, and Amoxicillin   Review of Systems Review of Systems  Constitutional:  Negative for chills and fever.  Gastrointestinal:  Positive for abdominal pain, nausea and vomiting. Negative for blood in stool, constipation and diarrhea.  Genitourinary:  Negative for dysuria and hematuria.     Physical Exam Triage Vital Signs ED Triage Vitals  Encounter Vitals Group     BP      Systolic BP Percentile      Diastolic BP Percentile      Pulse      Resp      Temp      Temp src      SpO2      Weight      Height      Head Circumference      Peak Flow      Pain Score      Pain Loc      Pain Education      Exclude from Growth Chart    No data found.  Updated Vital Signs BP (!) 132/93 (BP Location: Left Arm)   Pulse (!) 103   Temp 99.1 F (37.3 C) (Oral)   Resp 16   SpO2 95%    Visual Acuity Right Eye Distance:   Left Eye Distance:   Bilateral Distance:    Right Eye Near:   Left Eye Near:    Bilateral Near:  Physical Exam Constitutional:      General: He is not in acute distress.    Appearance: He is obese.  HENT:     Mouth/Throat:     Mouth: Mucous membranes are moist.  Cardiovascular:     Rate and Rhythm: Normal rate and regular rhythm.     Heart sounds: Normal heart sounds.  Pulmonary:     Effort: Pulmonary effort is normal. No respiratory distress.     Breath sounds: Normal breath sounds.  Abdominal:     General: Bowel sounds are normal.     Palpations: Abdomen is soft.     Tenderness: There is no abdominal tenderness. There is no right CVA tenderness, left CVA tenderness, guarding or rebound.  Neurological:     Mental Status: He is alert.      UC Treatments / Results  Labs (all labs ordered are listed, but only abnormal results are displayed) Labs Reviewed - No data to display  EKG   Radiology No results found.  Procedures Procedures (including critical care time)  Medications Ordered in UC Medications - No data to display  Initial Impression / Assessment and Plan / UC Course  I have reviewed the triage vital signs and the nursing notes.  Pertinent labs & imaging results that were available during my care of the patient were reviewed by me and considered in my medical decision making (see chart for details).    Generalized abdominal pain, nausea and vomiting.  Patient is currently asymptomatic.  His symptoms resolved overnight.  He has been able to eat and drink today without difficulty.  He has Zofran at home for use if needed.  Instructed him to keep himself hydrated with clear liquids.  ED precautions given.  Instructed him to follow-up with his PCP.  Education provided on abdominal pain and nausea and vomiting.  He agrees to plan of care.  Final Clinical Impressions(s) / UC Diagnoses   Final diagnoses:  Generalized  abdominal pain  Nausea and vomiting, unspecified vomiting type     Discharge Instructions      Take the previously prescribed antinausea medication as directed.    Keep yourself hydrated with clear liquids, such as water.    Go to the emergency department if you have worsening symptoms.    Follow up with your primary care provider.        ED Prescriptions   None    PDMP not reviewed this encounter.   Mickie Bail, NP 10/15/23 1440

## 2023-10-18 ENCOUNTER — Encounter: Payer: Self-pay | Admitting: Nurse Practitioner

## 2023-10-24 ENCOUNTER — Telehealth: Payer: Self-pay

## 2023-10-24 NOTE — Telephone Encounter (Signed)
 Copied from CRM [id number]. Topic: Clinical - Request for Lab/Test Order >> Oct 24, 2023  8:38 AM Robinson DEL wrote: Reason for CRM: Patient states provider might want labs done before visit on 11/15/2023 and if so he wants to pickup a copy of order to go to a [facility] location and have labs done before appointment.  Sevastian [phone]

## 2023-10-31 ENCOUNTER — Emergency Department: Payer: 59

## 2023-10-31 ENCOUNTER — Other Ambulatory Visit: Payer: Self-pay

## 2023-10-31 ENCOUNTER — Emergency Department
Admission: EM | Admit: 2023-10-31 | Discharge: 2023-10-31 | Disposition: A | Discharge status: Home / Self Care | Payer: 59 | Attending: Emergency Medicine | Admitting: Emergency Medicine

## 2023-10-31 DIAGNOSIS — R112 Nausea with vomiting, unspecified: Secondary | ICD-10-CM | POA: Insufficient documentation

## 2023-10-31 DIAGNOSIS — R1030 Lower abdominal pain, unspecified: Secondary | ICD-10-CM | POA: Insufficient documentation

## 2023-10-31 DIAGNOSIS — R197 Diarrhea, unspecified: Secondary | ICD-10-CM | POA: Diagnosis not present

## 2023-10-31 DIAGNOSIS — J45909 Unspecified asthma, uncomplicated: Secondary | ICD-10-CM | POA: Diagnosis not present

## 2023-10-31 DIAGNOSIS — I10 Essential (primary) hypertension: Secondary | ICD-10-CM | POA: Diagnosis not present

## 2023-10-31 DIAGNOSIS — A084 Viral intestinal infection, unspecified: Secondary | ICD-10-CM

## 2023-10-31 LAB — CBC WITH DIFFERENTIAL/PLATELET
Abs Immature Granulocytes: 0.04 10*3/uL (ref 0.00–0.07)
Basophils Absolute: 0.1 10*3/uL (ref 0.0–0.1)
Basophils Relative: 1 %
Eosinophils Absolute: 1.1 10*3/uL — ABNORMAL HIGH (ref 0.0–0.5)
Eosinophils Relative: 10 %
HCT: 46.5 % (ref 39.0–52.0)
Hemoglobin: 16.8 g/dL (ref 13.0–17.0)
Immature Granulocytes: 0 %
Lymphocytes Relative: 18 %
Lymphs Abs: 2 10*3/uL (ref 0.7–4.0)
MCH: 31.6 pg (ref 26.0–34.0)
MCHC: 36.1 g/dL — ABNORMAL HIGH (ref 30.0–36.0)
MCV: 87.6 fL (ref 80.0–100.0)
Monocytes Absolute: 0.7 10*3/uL (ref 0.1–1.0)
Monocytes Relative: 6 %
Neutro Abs: 7.2 10*3/uL (ref 1.7–7.7)
Neutrophils Relative %: 65 %
Platelets: 276 10*3/uL (ref 150–400)
RBC: 5.31 MIL/uL (ref 4.22–5.81)
RDW: 11.9 % (ref 11.5–15.5)
WBC: 11.1 10*3/uL — ABNORMAL HIGH (ref 4.0–10.5)
nRBC: 0 % (ref 0.0–0.2)

## 2023-10-31 LAB — GASTROINTESTINAL PANEL BY PCR, STOOL (REPLACES STOOL CULTURE)

## 2023-10-31 LAB — COMPREHENSIVE METABOLIC PANEL
ALT: 34 U/L (ref 0–44)
AST: 20 U/L (ref 15–41)
Albumin: 4.3 g/dL (ref 3.5–5.0)
Alkaline Phosphatase: 91 U/L (ref 38–126)
Anion gap: 11 (ref 5–15)
BUN: 13 mg/dL (ref 6–20)
CO2: 21 mmol/L — ABNORMAL LOW (ref 22–32)
Calcium: 9 mg/dL (ref 8.9–10.3)
Chloride: 105 mmol/L (ref 98–111)
Creatinine, Ser: 0.82 mg/dL (ref 0.61–1.24)
GFR, Estimated: >60 mL/min (ref >60)
Glucose, Bld: 92 mg/dL (ref 70–99)
Potassium: 3.5 mmol/L (ref 3.5–5.1)
Sodium: 137 mmol/L (ref 135–145)
Total Bilirubin: 1.2 mg/dL (ref 0.0–1.2)
Total Protein: 7.4 g/dL (ref 6.5–8.1)

## 2023-10-31 LAB — LIPASE, BLOOD: Lipase: 30 U/L (ref 11–51)

## 2023-10-31 LAB — C DIFFICILE QUICK SCREEN W PCR REFLEX
C Diff antigen: NEGATIVE
C Diff interpretation: NOT DETECTED
C Diff toxin: NEGATIVE

## 2023-10-31 MED ORDER — ONDANSETRON HCL 4 MG PO TABS: 4.0000 mg | ORAL_TABLET | Freq: Three times a day (TID) | ORAL | 0 refills | Status: DC | PRN

## 2023-10-31 MED ORDER — ONDANSETRON 4 MG PO TBDP
4.0000 mg | ORAL_TABLET | Freq: Once | ORAL | Status: AC
  Administered 2023-10-31: 4 mg via ORAL
  Filled 2023-10-31: qty 1

## 2023-10-31 MED ORDER — DICYCLOMINE HCL 10 MG PO CAPS
20.0000 mg | ORAL_CAPSULE | Freq: Once | ORAL | Status: AC
  Administered 2023-10-31: 20 mg via ORAL
  Filled 2023-10-31: qty 2

## 2023-10-31 MED ORDER — IOHEXOL 350 MG/ML SOLN
100.0000 mL | Freq: Once | INTRAVENOUS | Status: AC | PRN
  Administered 2023-10-31: 100 mL via INTRAVENOUS

## 2023-10-31 MED ORDER — DICYCLOMINE HCL 10 MG PO CAPS: 10.0000 mg | ORAL_CAPSULE | Freq: Three times a day (TID) | ORAL | 0 refills | Status: DC

## 2023-10-31 MED ORDER — LOPERAMIDE HCL 2 MG PO CAPS
2.0000 mg | ORAL_CAPSULE | Freq: Once | ORAL | Status: AC
  Administered 2023-10-31: 2 mg via ORAL
  Filled 2023-10-31: qty 1

## 2023-10-31 NOTE — ED Provider Triage Note (Signed)
 Emergency Medicine Provider Triage Evaluation Note  Caleb [name] , a 43 y.o. male  was evaluated in triage.  Pt complains of abdominal pain, N/V. He has had 3 episodes of these symptoms, lasting 24-48 hours over 6 weeks.   Review of Systems  Positive: Lower abdominal pain, n/v/d Negative: fever  Physical Exam  There were no vitals taken for this visit. Gen:   Awake, no distress   Resp:  Normal effort  MSK:   Moves extremities without difficulty  Other:    Medical Decision Making  Medically screening exam initiated at 2:14 PM.  Appropriate orders placed.  Caleb [name] was informed that the remainder of the evaluation will be completed by another provider, this initial triage assessment does not replace that evaluation, and the importance of remaining in the ED until their evaluation is complete.     Cleaster Tinnie LABOR, PA-C 10/31/23 1417

## 2023-10-31 NOTE — ED Triage Notes (Signed)
 Pt states diarrhea and emesis and since dose change of "Zepbound".

## 2023-10-31 NOTE — ED Provider Notes (Signed)
 [facility] Emergency Department Provider Note     Event Date/Time   First MD Initiated Contact with Patient 10/31/23 1754     (approximate)   History   Abdominal Pain   HPI  Caleb [name] is a 43 y.o. male with a history of obesity, HTN, HLD, OSA, and asthma, who presents to the ED for evaluation of diarrhea and emesis.  Patient is currently on Zepbound  for management of his obesity.  He would endorse abdominal pain with associated NVD.  He has had 3 episodes of the symptoms over the last 6 weeks.  He initially thought he had norovirus with the initial episode.  He was evaluated via his PCP and given nausea medicine and dicyclomine  at that time.  He was also taking his weekly doses of Zepbound  at that time.  About 2 weeks ago, he increased his dose again, had a separate episode of lower abdominal cramping pain with NVD.  He would report that he had been taking zinc and vitamin C supplements at that time, and later attributed his abdominal symptoms to a possible interaction between vitamin C and Zepbound  based on his research.  The symptoms resolved and he presents again today noting that he recently increased his Zepbound  dose to 10 mg.  He took his last dose of dicyclomine  and nausea medicine, now presents to the ED for evaluation at the urging of his wife.  Patient will report very limited p.o. intake in the last 24 hours.  At the time of this evaluation however, he does endorse that he feels relatively pain-free and has had no ongoing nausea, vomiting, or abdominal pain.  He would endorse ongoing watery stools.  He denies any blood the or coffee-ground emesis.  No bright red blood per rectum, and no melanotic stools.  She also denies any fevers, chills, sweats, chest pain, or shortness of breath.  Physical Exam   Triage Vital Signs: ED Triage Vitals  Encounter Vitals Group     BP 10/31/23 1415 (!) 147/98     Systolic BP Percentile --      Diastolic BP  Percentile --      Pulse Rate 10/31/23 1415 (!) 105     Resp 10/31/23 1415 18     Temp 10/31/23 1415 98.5 F (36.9 C)     Temp Source 10/31/23 1415 Oral     SpO2 10/31/23 1415 96 %     Weight 10/31/23 1416 250 lb (113.4 kg)     Height 10/31/23 1416 5' 7 (1.702 m)     Head Circumference --      Peak Flow --      Pain Score 10/31/23 1416 4     Pain Loc --      Pain Education --      Exclude from Growth Chart --     Most recent vital signs: Vitals:   10/31/23 1837 10/31/23 2024  BP: (!) 140/72 138/65  Pulse: (!) 108 97  Resp: 18 16  Temp: 98 F (36.7 C) 98.3 F (36.8 C)  SpO2: 98% 99%    General Awake, no distress. NAD HEENT NCAT. PERRL. EOMI. No rhinorrhea. Mucous membranes are moist.  CV:  Good peripheral perfusion. RRR RESP:  Normal effort. CTA ABD:  No distention.  Soft and generally nontender.  Normal bowel sounds appreciated no rebound, guarding, or rigidity noted.   ED Results / Procedures / Treatments   Labs (all labs ordered are listed, but only abnormal  results are displayed) Labs Reviewed  COMPREHENSIVE METABOLIC PANEL - Abnormal; Notable for the following components:      Result Value   CO2 21 (*)    All other components within normal limits  CBC WITH DIFFERENTIAL/PLATELET - Abnormal; Notable for the following components:   WBC 11.1 (*)    MCHC 36.1 (*)    Eosinophils Absolute 1.1 (*)    All other components within normal limits  GASTROINTESTINAL PANEL BY PCR, STOOL (REPLACES STOOL CULTURE)  C DIFFICILE QUICK SCREEN W PCR REFLEX    LIPASE, BLOOD    EKG   RADIOLOGY  I personally viewed and evaluated these images as part of my medical decision making, as well as reviewing the written report by the radiologist.  ED Provider Interpretation: no acute findings  CT ABD/PELVIS w/ CM  IMPRESSION: No acute abnormality noted.  No results found.   PROCEDURES:  Critical Care performed: No  Procedures   MEDICATIONS ORDERED IN ED: Medications   iohexol  (OMNIPAQUE ) 350 MG/ML injection 100 mL (100 mLs Intravenous Contrast Given 10/31/23 1959)  dicyclomine  (BENTYL ) capsule 20 mg (20 mg Oral Given 10/31/23 2037)  ondansetron  (ZOFRAN -ODT) disintegrating tablet 4 mg (4 mg Oral Given 10/31/23 2037)  loperamide  (IMODIUM ) capsule 2 mg (2 mg Oral Given 10/31/23 2037)     IMPRESSION / MDM / ASSESSMENT AND PLAN / ED COURSE  I reviewed the triage vital signs and the nursing notes.                              Differential diagnosis includes, but is not limited to, acute appendicitis, renal colic, testicular torsion, urinary tract infection/pyelonephritis, prostatitis,  epididymitis, diverticulitis, small bowel obstruction or ileus, colitis, abdominal aortic aneurysm, gastroenteritis, hernia, etc.  Patient's presentation is most consistent with acute complicated illness / injury requiring diagnostic workup.  Patient's diagnosis is consistent with lower abdominal pain with NVD.  Patient endorses improvement of her symptoms at this time.  Throughout his course in ED he has had no intractable nausea, vomiting, or diarrhea.  He was able to provide a stool sample which is pending evaluation under culture PCR at this time.  Patient's CT scan is reporting no acute findings.  No evidence of colitis or diverticulitis.  Patient is reassured by this normal limit CT finding.  Patient will be discharged home with prescriptions for dicyclomine  and ondansetron . Patient is to follow up with his PCP or GI specialist as discussed, as needed or otherwise directed. Patient is given ED precautions to return to the ED for any worsening or new symptoms.  FINAL CLINICAL IMPRESSION(S) / ED DIAGNOSES   Final diagnoses:  Lower abdominal pain  Nausea vomiting and diarrhea     Rx / DC Orders   ED Discharge Orders          Ordered    dicyclomine  (BENTYL ) 10 MG capsule  3 times daily before meals & bedtime        10/31/23 1959    ondansetron  (ZOFRAN ) 4 MG tablet  Every 8  hours PRN        10/31/23 1959             Note:  This document was prepared using Dragon voice recognition software and may include unintentional dictation errors.    Loyd Candida LULLA Aldona, PA-C 11/01/23 2212    Willo Dunnings, MD 11/01/23 2325

## 2023-10-31 NOTE — Discharge Instructions (Signed)
 Your exam and labs are normal and reassuring at this time.  CT scan is pending at the time, as you have chosen to discharge from the ED prior to final results.  You can follow results on call MyChart.  Follow-up with primary provider or GI specialist for ongoing evaluation.  Return to ED as discussed.

## 2023-11-05 NOTE — Telephone Encounter (Signed)
 Pt informed and stated that he does not need anything specific checked

## 2023-11-07 ENCOUNTER — Telehealth: Payer: 59 | Admitting: Physician Assistant

## 2023-11-07 DIAGNOSIS — J208 Acute bronchitis due to other specified organisms: Secondary | ICD-10-CM

## 2023-11-07 DIAGNOSIS — B9689 Other specified bacterial agents as the cause of diseases classified elsewhere: Secondary | ICD-10-CM | POA: Diagnosis not present

## 2023-11-07 MED ORDER — BENZONATATE 100 MG PO CAPS: 100.0000 mg | ORAL_CAPSULE | Freq: Three times a day (TID) | ORAL | 0 refills | Status: DC | PRN

## 2023-11-07 MED ORDER — AZITHROMYCIN 250 MG PO TABS: ORAL_TABLET | ORAL | 0 refills | Status: AC

## 2023-11-07 NOTE — Patient Instructions (Signed)
Doylene Canning, thank you for joining Piedad Climes, PA-C for today's virtual visit.  While this provider is not your primary care provider (PCP), if your PCP is located in our provider database this encounter information will be shared with them immediately following your visit.   A [facility] MyChart account gives you access to today's visit and all your visits, tests, and labs performed at [facility] " click here if you don't have a [facility] MyChart account or go to [email or web address]  Consent: (Patient) Caleb [name] provided verbal consent for this virtual visit at the beginning of the encounter.  Current Medications:  Current Outpatient Medications:    albuterol (VENTOLIN HFA) 108 (90 Base) MCG/ACT inhaler, Inhale 1-2 puffs into the lungs every 4 (four) hours as needed for wheezing or shortness of breath., Disp: 18 g, Rfl: 11   amLODipine (NORVASC) 2.5 MG tablet, Take 1 tablet (2.5 mg total) by mouth 2 (two) times daily., Disp: 180 tablet, Rfl: 3   ascorbic acid (VITAMIN C) 500 MG tablet, Take by mouth daily., Disp: , Rfl:    Cholecalciferol (VITAMIN D3 PO), Take by mouth. Takes 5000 units daily, Disp: , Rfl:    clindamycin (CLEOCIN-T) 1 % lotion, Apply 1 application topically daily. Apply after shower to affected areas., Disp: 60 mL, Rfl: 6   cyclobenzaprine (FLEXERIL) 5 MG tablet, Take 1 tablet (5 mg total) by mouth 2 (two) times daily as needed., Disp: 60 tablet, Rfl: 11   dicyclomine (BENTYL) 10 MG capsule, Take 1 capsule (10 mg total) by mouth 4 (four) times daily -  before meals and at bedtime for 30 doses., Disp: 30 capsule, Rfl: 0   Multiple Vitamins-Minerals (ZINC PO), Take by mouth daily., Disp: , Rfl:    multivitamin (ONE-A-DAY MEN'S) TABS tablet, Take 1 tablet by mouth daily., Disp: , Rfl:    nebivolol (BYSTOLIC) 5 MG tablet, Take 1 tablet (5 mg total) by mouth daily., Disp: 90 tablet, Rfl: 3   ondansetron (ZOFRAN) 4 MG tablet, Take 1 tablet (4  mg total) by mouth every 8 (eight) hours as needed for nausea or vomiting., Disp: 30 tablet, Rfl: 0   QUERCETIN PO, Take by mouth., Disp: , Rfl:    tirzepatide (ZEPBOUND) 10 MG/0.5ML Pen, Inject 10 mg into the skin once a week., Disp: 2 mL, Rfl: 0   tirzepatide (ZEPBOUND) 12.5 MG/0.5ML Pen, Inject 12.5 mg into the skin once a week., Disp: 2 mL, Rfl: 0   tirzepatide (ZEPBOUND) 15 MG/0.5ML Pen, Inject 15 mg into the skin once a week., Disp: 6 mL, Rfl: 1   tirzepatide (ZEPBOUND) 7.5 MG/0.5ML Pen, Inject 7.5 mg into the skin once a week. X 4 weeks, Disp: 2 mL, Rfl: 0   Medications ordered in this encounter:  No orders of the defined types were placed in this encounter.    *If you need refills on other medications prior to your next appointment, please contact your pharmacy*  Follow-Up: Call back or seek an in-person evaluation if the symptoms worsen or if the condition fails to improve as anticipated.  [facility] [phone]  Other Instructions Take antibiotic (Azithromycin) as directed.  Increase fluids.  Get plenty of rest. Use Mucinex for congestion. Use the tessalon as directed. Take a daily probiotic (I recommend Align or Culturelle, but even Activia Yogurt may be beneficial).  A humidifier placed in the bedroom may offer some relief for a dry, scratchy throat of nasal irritation.  Read information below  on acute bronchitis. Please call or return to clinic if symptoms are not improving.  Acute Bronchitis Bronchitis is when the airways that extend from the windpipe into the lungs get red, puffy, and painful (inflamed). Bronchitis often causes thick spit (mucus) to develop. This leads to a cough. A cough is the most common symptom of bronchitis. In acute bronchitis, the condition usually begins suddenly and goes away over time (usually in 2 weeks). Smoking, allergies, and asthma can make bronchitis worse. Repeated episodes of bronchitis may cause more lung problems.  HOME  CARE Rest. Drink enough fluids to keep your pee (urine) clear or pale yellow (unless you need to limit fluids as told by your doctor). Only take over-the-counter or prescription medicines as told by your doctor. Avoid smoking and secondhand smoke. These can make bronchitis worse. If you are a smoker, think about using nicotine gum or skin patches. Quitting smoking will help your lungs heal faster. Reduce the chance of getting bronchitis again by: Washing your hands often. Avoiding people with cold symptoms. Trying not to touch your hands to your mouth, nose, or eyes. Follow up with your doctor as told.  GET HELP IF: Your symptoms do not improve after 1 week of treatment. Symptoms include: Cough. Fever. Coughing up thick spit. Body aches. Chest congestion. Chills. Shortness of breath. Sore throat.  GET HELP RIGHT AWAY IF:  You have an increased fever. You have chills. You have severe shortness of breath. You have bloody thick spit (sputum). You throw up (vomit) often. You lose too much body fluid (dehydration). You have a severe headache. You faint.  MAKE SURE YOU:  Understand these instructions. Will watch your condition. Will get help right away if you are not doing well or get worse. Document Released: 03/26/2008 Document Revised: 06/10/2013 Document Reviewed: 03/31/2013 [facility] Patient Information 2015 [place], [place]. This information is not intended to replace advice given to you by your health care provider. Make sure you discuss any questions you have with your health care provider.    If you have been instructed to have an in-person evaluation today at a local Urgent Care facility, please use the link below. It will take you to a list of all of our available [facility], including address, phone number and hours of operation. Please do not delay care.  [facility]  If you or a family member do not have a primary care provider, use the  link below to schedule a visit and establish care. When you choose a [facility] primary care physician or advanced practice provider, you gain a long-term partner in health. Find a Primary Care Provider  Learn more about [facility] in-office and virtual care options: [facility] - Get Care Now

## 2023-11-07 NOTE — Progress Notes (Signed)
Virtual Visit Consent   Caleb [name], you are scheduled for a virtual visit with a [facility] provider today. Just as with appointments in the office, your consent must be obtained to participate. Your consent will be active for this visit and any virtual visit you may have with one of our providers in the next 365 days. If you have a MyChart account, a copy of this consent can be sent to you electronically.  As this is a virtual visit, video technology does not allow for your provider to perform a traditional examination. This may limit your provider's ability to fully assess your condition. If your provider identifies any concerns that need to be evaluated in person or the need to arrange testing (such as labs, EKG, etc.), we will make arrangements to do so. Although advances in technology are sophisticated, we cannot ensure that it will always work on either your end or our end. If the connection with a video visit is poor, the visit may have to be switched to a telephone visit. With either a video or telephone visit, we are not always able to ensure that we have a secure connection.  By engaging in this virtual visit, you consent to the provision of healthcare and authorize for your insurance to be billed (if applicable) for the services provided during this visit. Depending on your insurance coverage, you may receive a charge related to this service.  I need to obtain your verbal consent now. Are you willing to proceed with your visit today? Caleb [name] has provided verbal consent on 11/07/2023 for a virtual visit (video or telephone). Piedad Climes, [place]  Date: 11/07/2023 10:58 AM  Virtual Visit via Video Note   I, Piedad Climes, connected with  Caleb [name]  ([id number], 09/09/1981) on 11/07/23 at 11:00 AM EST by a video-enabled telemedicine application and verified that I am speaking with the correct person using two identifiers.  Location: Patient: Virtual Visit Location  Patient: Home Provider: Virtual Visit Location Provider: Home Office   I discussed the limitations of evaluation and management by telemedicine and the availability of in person appointments. The patient expressed understanding and agreed to proceed.    History of Present Illness: Caleb [name] is a 43 y.o. who identifies as a male who was assigned male at birth, and is being seen today for several days progressively worsening of nasal and head congestion with sore throat, chest congestion and cough. Notes cough is more productive in the morning. It was clear but now has turned yellow on him. Denies fever, chills. Unable to use CPAP last night due to coughing.   HPI: HPI  Problems:  Patient Active Problem List   Diagnosis Date Noted   Vitamin D deficiency 04/10/2023   BMI 45.0-49.9, adult (HCC) 05/08/2022   DDD (degenerative disc disease), lumbar 04/26/2021   Chronic fatigue 11/09/2019   Chronic pain of right knee 11/09/2019   Snoring 11/09/2019   Asthma 11/06/2019   Lumbosacral radiculopathy at L5 08/12/2019   Neuropathy 08/12/2019   Morbid obesity with BMI of 40.0-44.9, adult (HCC) 01/06/2019   Fatty liver 01/29/2018   Nocturnal leg cramps 12/04/2017   Polyarthralgia 12/28/2016   Plantar fasciitis 12/04/2015   Preventative health care 12/04/2015   Hypertension 06/16/2015   Hyperlipidemia 06/16/2015   Obstructive sleep apnea 06/16/2015    Allergies:  Allergies  Allergen Reactions   Gabapentin     Felt back and didn't work    Lisinopril  Cough    Amoxicillin     Itching/crawling sensation     Medications:  Current Outpatient Medications:    azithromycin (ZITHROMAX) 250 MG tablet, Take 2 tablets on day 1, then 1 tablet daily on days 2 through 5, Disp: 6 tablet, Rfl: 0   benzonatate (TESSALON) 100 MG capsule, Take 1 capsule (100 mg total) by mouth 3 (three) times daily as needed for cough., Disp: 30 capsule, Rfl: 0   albuterol (VENTOLIN HFA) 108 (90 Base) MCG/ACT  inhaler, Inhale 1-2 puffs into the lungs every 4 (four) hours as needed for wheezing or shortness of breath., Disp: 18 g, Rfl: 11   amLODipine (NORVASC) 2.5 MG tablet, Take 1 tablet (2.5 mg total) by mouth 2 (two) times daily., Disp: 180 tablet, Rfl: 3   Cholecalciferol (VITAMIN D3 PO), Take by mouth. Takes 5000 units daily, Disp: , Rfl:    clindamycin (CLEOCIN-T) 1 % lotion, Apply 1 application topically daily. Apply after shower to affected areas., Disp: 60 mL, Rfl: 6   cyclobenzaprine (FLEXERIL) 5 MG tablet, Take 1 tablet (5 mg total) by mouth 2 (two) times daily as needed., Disp: 60 tablet, Rfl: 11   dicyclomine (BENTYL) 10 MG capsule, Take 1 capsule (10 mg total) by mouth 4 (four) times daily -  before meals and at bedtime for 30 doses., Disp: 30 capsule, Rfl: 0   Multiple Vitamins-Minerals (ZINC PO), Take by mouth daily., Disp: , Rfl:    multivitamin (ONE-A-DAY MEN'S) TABS tablet, Take 1 tablet by mouth daily., Disp: , Rfl:    nebivolol (BYSTOLIC) 5 MG tablet, Take 1 tablet (5 mg total) by mouth daily., Disp: 90 tablet, Rfl: 3   ondansetron (ZOFRAN) 4 MG tablet, Take 1 tablet (4 mg total) by mouth every 8 (eight) hours as needed for nausea or vomiting., Disp: 30 tablet, Rfl: 0   QUERCETIN PO, Take by mouth., Disp: , Rfl:    tirzepatide (ZEPBOUND) 10 MG/0.5ML Pen, Inject 10 mg into the skin once a week., Disp: 2 mL, Rfl: 0   tirzepatide (ZEPBOUND) 12.5 MG/0.5ML Pen, Inject 12.5 mg into the skin once a week., Disp: 2 mL, Rfl: 0  Observations/Objective: Patient is well-developed, well-nourished in no acute distress.  Resting comfortably at home.  Head is normocephalic, atraumatic.  No labored breathing. Speech is clear and coherent with logical content.  Patient is alert and oriented at baseline.   Assessment and Plan: 1. Acute bacterial bronchitis (Primary) - benzonatate (TESSALON) 100 MG capsule; Take 1 capsule (100 mg total) by mouth 3 (three) times daily as needed for cough.  Dispense:  30 capsule; Refill: 0 - azithromycin (ZITHROMAX) 250 MG tablet; Take 2 tablets on day 1, then 1 tablet daily on days 2 through 5  Dispense: 6 tablet; Refill: 0  Rx Azithromycin.  Increase fluids.  Rest.  Saline nasal spray.  Probiotic.  Mucinex as directed.  Humidifier in bedroom. Tessalon per orders.  Call or return to clinic if symptoms are not improving.   Follow Up Instructions: I discussed the assessment and treatment plan with the patient. The patient was provided an opportunity to ask questions and all were answered. The patient agreed with the plan and demonstrated an understanding of the instructions.  A copy of instructions were sent to the patient via MyChart unless otherwise noted below.   The patient was advised to call back or seek an in-person evaluation if the symptoms worsen or if the condition fails to improve as anticipated.    Sherley Bounds  Daphine Deutscher, PA-C

## 2023-11-15 ENCOUNTER — Encounter: Payer: Self-pay | Admitting: Nurse Practitioner

## 2023-11-15 ENCOUNTER — Ambulatory Visit (INDEPENDENT_AMBULATORY_CARE_PROVIDER_SITE_OTHER): Payer: 59 | Admitting: Nurse Practitioner

## 2023-11-15 VITALS — BP 124/92 | HR 93 | Temp 98.3°F | Ht 67.0 in | Wt 251.2 lb

## 2023-11-15 DIAGNOSIS — E559 Vitamin D deficiency, unspecified: Secondary | ICD-10-CM | POA: Diagnosis not present

## 2023-11-15 DIAGNOSIS — I10 Essential (primary) hypertension: Secondary | ICD-10-CM

## 2023-11-15 DIAGNOSIS — E785 Hyperlipidemia, unspecified: Secondary | ICD-10-CM | POA: Diagnosis not present

## 2023-11-15 DIAGNOSIS — E669 Obesity, unspecified: Secondary | ICD-10-CM

## 2023-11-15 DIAGNOSIS — Z6839 Body mass index (BMI) 39.0-39.9, adult: Secondary | ICD-10-CM

## 2023-11-15 DIAGNOSIS — Z1329 Encounter for screening for other suspected endocrine disorder: Secondary | ICD-10-CM

## 2023-11-15 NOTE — Assessment & Plan Note (Signed)
He has stopped taking amlodipine and has been monitoring blood pressure at home, with readings slightly elevated, averaging in the 130s/80-90s. Resume amlodipine 2.5mg  daily and continue to check blood pressure at home.

## 2023-11-15 NOTE — Progress Notes (Signed)
Bethanie Dicker, NP-C Phone: [phone]  Caleb [name] is a 43 y.o. male who presents today for follow up.   Discussed the use of AI scribe software for clinical note transcription with the patient, who gave verbal consent to proceed.  History of Present Illness   The patient, with a history of obesity, hypertension, and fatty liver disease, presents for a three-month follow-up. He reports a significant lifestyle change, including dietary modifications and regular gym visits, resulting in a weight loss of almost 50 pounds. He has been on Zepbound 10mg  for weight loss but has experienced multiple episodes of gastrointestinal upset, including diarrhea and vomiting, which he attributes to the medication and increased vitamin C intake. He also reports hypersensitivity to certain food smells, such as fry oil.  The patient has decided to discontinue Zepbound due to these side effects and the high cost of the medication. He expresses confidence in maintaining his weight loss through continued lifestyle changes. He also mentions a previous successful experience with a supplement called Lean from [employer].  In addition to these changes, the patient has stopped his blood pressure medication, amlodipine, as an experiment to see if he can manage without it. He has been monitoring his blood pressure at home, with readings averaging around 130/80-90. 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  The patient has also been using albuterol as needed due to cold weather. He expresses curiosity about methods to increase lung capacity but denies any current respiratory issues. He plans to restart amlodipine 2.5mg  once daily for blood pressure control.      Social History   Tobacco Use  Smoking Status Never  Smokeless Tobacco Former   Types: Chew    Current Outpatient Medications on File Prior  to Visit  Medication Sig Dispense Refill   albuterol (VENTOLIN HFA) 108 (90 Base) MCG/ACT inhaler Inhale 1-2 puffs into the lungs every 4 (four) hours as needed for wheezing or shortness of breath. 18 g 11   amLODipine (NORVASC) 2.5 MG tablet Take 1 tablet (2.5 mg total) by mouth 2 (two) times daily. 180 tablet 3   Cholecalciferol (VITAMIN D3 PO) Take by mouth. Takes 5000 units daily     clindamycin (CLEOCIN-T) 1 % lotion Apply 1 application topically daily. Apply after shower to affected areas. 60 mL 6   cyclobenzaprine (FLEXERIL) 5 MG tablet Take 1 tablet (5 mg total) by mouth 2 (two) times daily as needed. 60 tablet 11   Multiple Vitamins-Minerals (ZINC PO) Take by mouth daily.     multivitamin (ONE-A-DAY MEN'S) TABS tablet Take 1 tablet by mouth daily.     QUERCETIN PO Take by mouth.     No current facility-administered medications on file prior to visit.    ROS see history of present illness  Objective  Physical Exam Vitals:   11/15/23 1432 11/15/23 1454  BP: (!) 138/94 (!) 124/92  Pulse: 93   Temp: 98.3 F (36.8 C)   SpO2: 98%     BP Readings from Last 3 Encounters:  11/15/23 (!) 124/92  10/31/23 138/65  10/15/23 (!) 132/93   Wt Readings from Last 3 Encounters:  11/15/23 251 lb 3.2 oz (113.9 kg)  10/31/23 250 lb (113.4 kg)  08/09/23 275 lb 3.2 oz (124.8 kg)    Physical Exam Constitutional:      General: He is not in acute distress.    Appearance: Normal appearance.  HENT:     Head: Normocephalic.  Cardiovascular:     Rate and Rhythm: Normal rate and regular rhythm.     Heart sounds: Normal heart sounds.  Pulmonary:     Effort: Pulmonary effort is normal.     Breath sounds: Normal breath sounds.  Skin:    General: Skin is warm and dry.  Neurological:     General: No focal deficit present.     Mental Status: He is alert.  Psychiatric:        Mood and Affect: Mood normal.        Behavior: Behavior normal.     Assessment/Plan: Please see individual  problem list.  Primary hypertension Assessment & Plan: He has stopped taking amlodipine and has been monitoring blood pressure at home, with readings slightly elevated, averaging in the 130s/80-90s. Resume amlodipine 2.5mg  daily and continue to check blood pressure at home.   Orders: -     Comprehensive metabolic panel -     CBC with Differential/Platelet  Obesity (BMI 30-39.9) Assessment & Plan: Significant weight loss has been achieved through lifestyle modifications and Zepbound. He reports adverse effects with Zepbound 10mg  and wishes to discontinue it. He has lost almost 50 pounds and plans to continue with lifestyle modifications. Discontinue Zepbound 10mg  and monitor weight closely. Encouraged to continue healthy diet and exercise.   Orders: -     Hemoglobin A1c  Hyperlipidemia, unspecified hyperlipidemia type -     Lipid panel  Vitamin D deficiency -     VITAMIN D 25 Hydroxy (Vit-D Deficiency, Fractures)  Thyroid disorder screen -     TSH   Return in about 6 months (around 05/11/2024) for Annual Exam. Please complete fasting lab work 2-3 days prior. Bethanie Dicker, NP-C  Primary Care - [facility]

## 2023-11-15 NOTE — Assessment & Plan Note (Signed)
Significant weight loss has been achieved through lifestyle modifications and Zepbound. He reports adverse effects with Zepbound 10mg  and wishes to discontinue it. He has lost almost 50 pounds and plans to continue with lifestyle modifications. Discontinue Zepbound 10mg  and monitor weight closely. Encouraged to continue healthy diet and exercise.

## 2024-04-13 ENCOUNTER — Telehealth: Payer: Self-pay | Admitting: Nurse Practitioner

## 2024-04-13 ENCOUNTER — Other Ambulatory Visit (INDEPENDENT_AMBULATORY_CARE_PROVIDER_SITE_OTHER)

## 2024-04-13 DIAGNOSIS — E559 Vitamin D deficiency, unspecified: Secondary | ICD-10-CM

## 2024-04-13 DIAGNOSIS — E669 Obesity, unspecified: Secondary | ICD-10-CM

## 2024-04-13 DIAGNOSIS — E785 Hyperlipidemia, unspecified: Secondary | ICD-10-CM

## 2024-04-13 DIAGNOSIS — I10 Essential (primary) hypertension: Secondary | ICD-10-CM

## 2024-04-13 DIAGNOSIS — Z1329 Encounter for screening for other suspected endocrine disorder: Secondary | ICD-10-CM

## 2024-04-13 NOTE — Addendum Note (Signed)
 Addended by: TANDA HARVEY D on: 04/13/2024 01:49 PM   Modules accepted: Orders

## 2024-04-13 NOTE — Telephone Encounter (Signed)
 Patient need lab orders.

## 2024-04-14 LAB — LIPID PANEL
Chol/HDL Ratio: 4.7 ratio (ref 0.0–5.0)
Cholesterol, Total: 184 mg/dL (ref 100–199)
HDL: 39 mg/dL — ABNORMAL LOW (ref >39)
LDL Chol Calc (NIH): 123 mg/dL — ABNORMAL HIGH (ref 0–99)
Triglycerides: 120 mg/dL (ref 0–149)
VLDL Cholesterol Cal: 22 mg/dL (ref 5–40)

## 2024-04-14 LAB — COMPREHENSIVE METABOLIC PANEL WITH GFR
ALT: 26 IU/L (ref 0–44)
AST: 22 IU/L (ref 0–40)
Albumin: 4.2 g/dL (ref 4.1–5.1)
Alkaline Phosphatase: 112 IU/L (ref 44–121)
BUN/Creatinine Ratio: 13 (ref 9–20)
BUN: 11 mg/dL (ref 6–24)
Bilirubin Total: 0.4 mg/dL (ref 0.0–1.2)
CO2: 22 mmol/L (ref 20–29)
Calcium: 8.9 mg/dL (ref 8.7–10.2)
Chloride: 101 mmol/L (ref 96–106)
Creatinine, Ser: 0.82 mg/dL (ref 0.76–1.27)
Globulin, Total: 2.2 g/dL (ref 1.5–4.5)
Glucose: 82 mg/dL (ref 70–99)
Potassium: 4.1 mmol/L (ref 3.5–5.2)
Sodium: 137 mmol/L (ref 134–144)
Total Protein: 6.4 g/dL (ref 6.0–8.5)
eGFR: 111 mL/min/{1.73_m2} (ref >59)

## 2024-04-14 LAB — CBC WITH DIFFERENTIAL/PLATELET
Basophils Absolute: 0.1 10*3/uL (ref 0.0–0.2)
Basos: 1 %
EOS (ABSOLUTE): 0.1 10*3/uL (ref 0.0–0.4)
Eos: 2 %
Hematocrit: 46.6 % (ref 37.5–51.0)
Hemoglobin: 15.6 g/dL (ref 13.0–17.7)
Immature Grans (Abs): 0 10*3/uL (ref 0.0–0.1)
Immature Granulocytes: 0 %
Lymphocytes Absolute: 2 10*3/uL (ref 0.7–3.1)
Lymphs: 30 %
MCH: 32.5 pg (ref 26.6–33.0)
MCHC: 33.5 g/dL (ref 31.5–35.7)
MCV: 97 fL (ref 79–97)
Monocytes Absolute: 0.6 10*3/uL (ref 0.1–0.9)
Monocytes: 9 %
Neutrophils Absolute: 3.7 10*3/uL (ref 1.4–7.0)
Neutrophils: 57 %
Platelets: 284 10*3/uL (ref 150–450)
RBC: 4.8 x10E6/uL (ref 4.14–5.80)
RDW: 11.9 % (ref 11.6–15.4)
WBC: 6.5 10*3/uL (ref 3.4–10.8)

## 2024-04-14 LAB — TSH: TSH: 2.06 u[IU]/mL (ref 0.450–4.500)

## 2024-04-14 LAB — VITAMIN D 25 HYDROXY (VIT D DEFICIENCY, FRACTURES): Vit D, 25-Hydroxy: 52.4 ng/mL (ref 30.0–100.0)

## 2024-04-14 LAB — HEMOGLOBIN A1C
Est. average glucose Bld gHb Est-mCnc: 108 mg/dL
Hgb A1c MFr Bld: 5.4 % (ref 4.8–5.6)

## 2024-04-17 ENCOUNTER — Ambulatory Visit: Payer: Self-pay | Admitting: Nurse Practitioner

## 2024-05-14 ENCOUNTER — Ambulatory Visit (INDEPENDENT_AMBULATORY_CARE_PROVIDER_SITE_OTHER): Payer: 59 | Admitting: Nurse Practitioner

## 2024-05-14 VITALS — BP 132/88 | HR 96 | Temp 98.8°F | Ht 67.0 in | Wt 272.2 lb

## 2024-05-14 DIAGNOSIS — J4530 Mild persistent asthma, uncomplicated: Secondary | ICD-10-CM

## 2024-05-14 DIAGNOSIS — E559 Vitamin D deficiency, unspecified: Secondary | ICD-10-CM

## 2024-05-14 DIAGNOSIS — I10 Essential (primary) hypertension: Secondary | ICD-10-CM

## 2024-05-14 DIAGNOSIS — Z0000 Encounter for general adult medical examination without abnormal findings: Secondary | ICD-10-CM | POA: Diagnosis not present

## 2024-05-14 DIAGNOSIS — Z6841 Body Mass Index (BMI) 40.0 and over, adult: Secondary | ICD-10-CM

## 2024-05-14 DIAGNOSIS — E785 Hyperlipidemia, unspecified: Secondary | ICD-10-CM | POA: Diagnosis not present

## 2024-05-14 DIAGNOSIS — Z1211 Encounter for screening for malignant neoplasm of colon: Secondary | ICD-10-CM
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
## 2024-05-27 ENCOUNTER — Encounter: Payer: Self-pay | Admitting: Nurse Practitioner

## 2024-05-27 NOTE — Assessment & Plan Note (Signed)
 No longer taking pravastatin  due to side effects. Controlled with diet and exercise. Cholesterol improvement noted in labs. LDL remains above goal. The 10-year ASCVD risk score (Arnett DK, et al., 2019) is: 3%. Encourage lifestyle modifications, healthy diet and regular exercise. We will continue to monitor.

## 2024-05-27 NOTE — Assessment & Plan Note (Signed)
 He gained 20 pounds since stopping Zepbound , believes he has increased some muscle from jujitsu. Reports a healthy diet and regular exercise. Encourage continued regular exercise and a healthy diet. Encourage weight loss. We will continue to monitor.

## 2024-05-27 NOTE — Assessment & Plan Note (Signed)
 Physical exam complete. Lab work completed prior to appointment and reviewed today with patient. Colonoscopy is due, referral placed to GI. Declines flu and COVID vaccines. Tetanus vaccine is up to date. Continue routine dental and eye exams. Encourage continued healthy diet and regular exercise. Return to care in 6 months, sooner as needed.

## 2024-05-27 NOTE — Assessment & Plan Note (Signed)
 Blood pressure is generally controlled with amlodipine  5 mg daily, now adjusted to a single morning dose for better control. Continue amlodipine  5 mg daily. Monitor blood pressure regularly and re-evaluate control in six months.

## 2024-05-27 NOTE — Assessment & Plan Note (Signed)
 Vitamin D  level normal in labs. Continue OTC supplement.

## 2024-11-12 ENCOUNTER — Encounter
Admission: RE | Disposition: A | Discharge status: Home / Self Care | Payer: Self-pay | Source: Home / Self Care | Attending: Gastroenterology

## 2024-11-12 ENCOUNTER — Ambulatory Visit
Admission: RE | Admit: 2024-11-12 | Discharge: 2024-11-12 | Disposition: A | Discharge status: Home / Self Care | Attending: Gastroenterology | Admitting: Gastroenterology

## 2024-11-12 ENCOUNTER — Ambulatory Visit: Admitting: Anesthesiology

## 2024-11-12 ENCOUNTER — Encounter: Payer: Self-pay | Admitting: Gastroenterology

## 2024-11-12 HISTORY — PX: POLYPECTOMY: SHX149

## 2024-11-12 HISTORY — PX: COLONOSCOPY: SHX5424

## 2024-11-12 MED ORDER — PROPOFOL 500 MG/50ML IV EMUL
INTRAVENOUS | Status: DC | PRN
  Administered 2024-11-12: 75 ug/kg/min via INTRAVENOUS

## 2024-11-12 MED ORDER — PROPOFOL 10 MG/ML IV BOLUS
INTRAVENOUS | Status: DC | PRN
  Administered 2024-11-12 (×3): 50 mg via INTRAVENOUS

## 2024-11-12 MED ORDER — SODIUM CHLORIDE 0.9 % IV SOLN
INTRAVENOUS | Status: DC
  Administered 2024-11-12: 500 mL via INTRAVENOUS

## 2024-11-12 MED ORDER — DEXMEDETOMIDINE HCL IN NACL 80 MCG/20ML IV SOLN
INTRAVENOUS | Status: DC | PRN
  Administered 2024-11-12: 8 ug via INTRAVENOUS
  Administered 2024-11-12: 12 ug via INTRAVENOUS

## 2024-11-12 MED ORDER — LIDOCAINE HCL (CARDIAC) PF 100 MG/5ML IV SOSY
PREFILLED_SYRINGE | INTRAVENOUS | Status: DC | PRN
  Administered 2024-11-12: 80 mg via INTRAVENOUS

## 2024-11-12 MED ORDER — LIDOCAINE HCL (PF) 2 % IJ SOLN
INTRAMUSCULAR | Status: AC
  Filled 2024-11-12: qty 5

## 2024-11-12 NOTE — Transfer of Care (Signed)
 Immediate Anesthesia Transfer of Care Note  Patient: Caleb [name]  Procedure(s) Performed: COLONOSCOPY POLYPECTOMY, INTESTINE  Patient Location: PACU  Anesthesia Type:General  Level of Consciousness: sedated  Airway & Oxygen Therapy: Patient Spontanous Breathing  Post-op Assessment: Report given to RN and Post -op Vital signs reviewed and stable  Post vital signs: Reviewed and stable  Last Vitals:  Vitals Value Taken Time  BP 106/84 11/12/24 12:22  Temp    Pulse 83 11/12/24 12:22  Resp 20 11/12/24 12:22  SpO2 98 % 11/12/24 12:22  Vitals shown include unfiled device data.  Last Pain:  Vitals:   11/12/24 1051  TempSrc: Temporal  PainSc: 0-No pain         Complications: No notable events documented.

## 2024-11-12 NOTE — Anesthesia Postprocedure Evaluation (Signed)
"   Anesthesia Post Note  Patient: Caleb [name]  Procedure(s) Performed: COLONOSCOPY POLYPECTOMY, INTESTINE  Patient location during evaluation: PACU Anesthesia Type: General Level of consciousness: awake Pain management: satisfactory to patient Vital Signs Assessment: post-procedure vital signs reviewed and stable Respiratory status: spontaneous breathing Cardiovascular status: blood pressure returned to baseline Anesthetic complications: no   No notable events documented.   Last Vitals:  Vitals:   11/12/24 1232 11/12/24 1242  BP: 116/82 123/78  Pulse: 75 72  Resp: 20 19  Temp:    SpO2: 98% 97%    Last Pain:  Vitals:   11/12/24 1242  TempSrc:   PainSc: 0-No pain                 VAN STAVEREN,Shawnita Krizek      "

## 2024-11-12 NOTE — Anesthesia Preprocedure Evaluation (Signed)
"                                    Anesthesia Evaluation  Patient identified by MRN, date of birth, ID band Patient awake    Reviewed: Allergy & Precautions, NPO status , Patient's Chart, lab work & pertinent test results  Airway Mallampati: III  TM Distance: >3 FB Neck ROM: Full    Dental  (+) Teeth Intact   Pulmonary neg pulmonary ROS, asthma , sleep apnea and Continuous Positive Airway Pressure Ventilation    Pulmonary exam normal        Cardiovascular Exercise Tolerance: Good hypertension, Pt. on medications negative cardio ROS Normal cardiovascular exam Rhythm:Regular Rate:Normal     Neuro/Psych  Neuromuscular disease negative neurological ROS  negative psych ROS   GI/Hepatic negative GI ROS, Neg liver ROS,,,  Endo/Other  negative endocrine ROS  Class 4 obesity  Renal/GU negative Renal ROS  negative genitourinary   Musculoskeletal   Abdominal  (+) + obese  Peds negative pediatric ROS (+)  Hematology negative hematology ROS (+)   Anesthesia Other Findings Past Medical History: 11/06/2019: Asthma No date: Chicken pox No date: Hyperlipidemia No date: Hypertension 12/04/2017: Nocturnal leg cramps No date: Obesity No date: Obstructive sleep apnea  Past Surgical History: No date: WISDOM TOOTH EXTRACTION  BMI    Body Mass Index: 42.54 kg/m      Reproductive/Obstetrics negative OB ROS                              Anesthesia Physical Anesthesia Plan  ASA: 3  Anesthesia Plan: General   Post-op Pain Management:    Induction: Intravenous  PONV Risk Score and Plan: Propofol  infusion and TIVA  Airway Management Planned: Natural Airway and Nasal Cannula  Additional Equipment:   Intra-op Plan:   Post-operative Plan:   Informed Consent: I have reviewed the patients History and Physical, chart, labs and discussed the procedure including the risks, benefits and alternatives for the proposed anesthesia with  the patient or authorized representative who has indicated his/her understanding and acceptance.     Dental Advisory Given  Plan Discussed with: CRNA  Anesthesia Plan Comments:         Anesthesia Quick Evaluation  "

## 2024-11-12 NOTE — Op Note (Signed)
 [facility] Gastroenterology Patient Name: Caleb [name] Procedure Date: 11/12/2024 11:44 AM MRN: [id number] Account #: [id number] Date of Birth: 07-28-1981 Admit Type: Outpatient Age: 44 Room: [facility] ENDO ROOM 1 Gender: Male Note Status: Finalized Instrument Name: Colon Scope [id number] Procedure:             Colonoscopy Indications:           Screening for colorectal malignant neoplasm Providers:             Elspeth Ozell Onita ROSALEA, DO Referring MD:          Leron Glance (Referring MD) Medicines:             Monitored Anesthesia Care Complications:         No immediate complications. Estimated blood loss:                         Minimal. Procedure:             Pre-Anesthesia Assessment:                        - Prior to the procedure, a History and Physical was                         performed, and patient medications and allergies were                         reviewed. The patient is competent. The risks and                         benefits of the procedure and the sedation options and                         risks were discussed with the patient. All questions                         were answered and informed consent was obtained.                         Patient identification and proposed procedure were                         verified by the physician, the nurse, the anesthetist                         and the technician in the endoscopy suite. Mental                         Status Examination: alert and oriented. Airway                         Examination: normal oropharyngeal airway and neck                         mobility. Respiratory Examination: clear to                         auscultation. CV Examination: RRR, no murmurs, no S3  or S4. Prophylactic Antibiotics: The patient does not                         require prophylactic antibiotics. Prior                         Anticoagulants: The patient has taken no anticoagulant                          or antiplatelet agents. ASA Grade Assessment: III - A                         patient with severe systemic disease. After reviewing                         the risks and benefits, the patient was deemed in                         satisfactory condition to undergo the procedure. The                         anesthesia plan was to use monitored anesthesia care                         (MAC). Immediately prior to administration of                         medications, the patient was re-assessed for adequacy                         to receive sedatives. The heart rate, respiratory                         rate, oxygen saturations, blood pressure, adequacy of                         pulmonary ventilation, and response to care were                         monitored throughout the procedure. The physical                         status of the patient was re-assessed after the                         procedure.                        After obtaining informed consent, the colonoscope was                         passed under direct vision. Throughout the procedure,                         the patient's blood pressure, pulse, and oxygen                         saturations were monitored continuously. The  Colonoscope was introduced through the anus and                         advanced to the the cecum, identified by appendiceal                         orifice and ileocecal valve. The colonoscopy was                         somewhat difficult due to the patient's body habitus.                         Successful completion of the procedure was aided by                         straightening and shortening the scope to obtain bowel                         loop reduction, using scope torsion, applying                         abdominal pressure and lavage. The patient tolerated                         the procedure well. The quality of the bowel                          preparation was evaluated using the BBPS [facility] Bowel                         Preparation Scale) with scores of: Right Colon = 2                         (minor amount of residual staining, small fragments of                         stool and/or opaque liquid, but mucosa seen well),                         Transverse Colon = 2 (minor amount of residual                         staining, small fragments of stool and/or opaque                         liquid, but mucosa seen well) and Left Colon = 2                         (minor amount of residual staining, small fragments of                         stool and/or opaque liquid, but mucosa seen well). The                         total BBPS score equals 6. The quality of the bowel  preparation was good. The ileocecal valve, appendiceal                         orifice, and rectum were photographed. Findings:      The perianal and digital rectal examinations were normal. Pertinent       negatives include normal sphincter tone.      Non-bleeding internal hemorrhoids were found during retroflexion. The       hemorrhoids were Grade I (internal hemorrhoids that do not prolapse).       Estimated blood loss: none.      A 1 mm polyp was found in the cecum. The polyp was sessile. The polyp       was removed with a jumbo cold forceps. Resection and retrieval were       complete. Estimated blood loss was minimal.      A 16 to 18 mm polyp was found in the descending colon. The polyp was       pedunculated. The polyp was removed with a hot snare. Resection and       retrieval were complete. Estimated blood loss was minimal.      The exam was otherwise without abnormality on direct and retroflexion       views. Impression:            - Non-bleeding internal hemorrhoids.                        - One 1 mm polyp in the cecum, removed with a jumbo                         cold forceps. Resected and retrieved.                        - One 16  to 18 mm polyp in the descending colon,                         removed with a hot snare. Resected and retrieved.                        - The examination was otherwise normal on direct and                         retroflexion views. Recommendation:        - Patient has a contact number available for                         emergencies. The signs and symptoms of potential                         delayed complications were discussed with the patient.                         Return to normal activities tomorrow. Written                         discharge instructions were provided to the patient.                        - Discharge patient to home.                        -  Resume previous diet.                        - Continue present medications.                        - No ibuprofen, naproxen, or other non-steroidal                         anti-inflammatory drugs for 5 days after polyp removal.                        - Await pathology results.                        - Await pathology results.                        - Repeat colonoscopy in 3 years for surveillance based                         on pathology results.                        - Return to referring physician as previously                         scheduled.                        - The findings and recommendations were discussed with                         the patient. Procedure Code(s):     --- Professional ---                        [phone], Colonoscopy, flexible; with removal of                         tumor(s), polyp(s), or other lesion(s) by snare                         technique                        [id number], 59, Colonoscopy, flexible; with biopsy, single                         or multiple Diagnosis Code(s):     --- Professional ---                        Z12.11, Encounter for screening for malignant neoplasm                         of colon                        D12.0, Benign neoplasm of cecum                         D12.4, Benign neoplasm of descending colon  K64.0, First degree hemorrhoids CPT copyright 2022 [employer]. All rights reserved. The codes documented in this report are preliminary and upon coder review may  be revised to meet current compliance requirements. Attending Participation:      I personally performed the entire procedure. Elspeth Jungling, DO Elspeth Ozell Jungling DO, DO 11/12/2024 12:23:39 PM This report has been signed electronically. Number of Addenda: 0 Note Initiated On: 11/12/2024 11:44 AM Scope Withdrawal Time: 0 hours 14 minutes 35 seconds  Total Procedure Duration: 0 hours 26 minutes 48 seconds  Estimated Blood Loss:  Estimated blood loss was minimal.      [facility]

## 2024-11-12 NOTE — H&P (Signed)
 "  Pre-Procedure H&P   Patient ID: Caleb [name] is a 44 y.o. male.  Gastroenterology Provider: Elspeth Ozell Jungling, DO  Referring Provider: Leron Glance, NP PCP: Glance Leron, NP  Date: 11/12/2024  HPI Mr. Caleb [name] is a 44 y.o. male who presents today for Colonoscopy for Colorectal cancer screening .  Last csy 15 years ago- normal  Bowel movements regular without melena or hematochezia.  No primary relatives with colon cancer or colon polyps   Past Medical History:  Diagnosis Date   Asthma 11/06/2019   Chicken pox    Hyperlipidemia    Hypertension    Nocturnal leg cramps 12/04/2017   Obesity    Obstructive sleep apnea     Past Surgical History:  Procedure Laterality Date   WISDOM TOOTH EXTRACTION      Family History MGM- CRC No other h/o GI disease or malignancy  Review of Systems  Constitutional:  Negative for activity change, appetite change, chills, diaphoresis, fatigue, fever and unexpected weight change.  HENT:  Negative for trouble swallowing and voice change.   Respiratory:  Negative for shortness of breath and wheezing.   Cardiovascular:  Negative for chest pain, palpitations and leg swelling.  Gastrointestinal:  Negative for abdominal distention, abdominal pain, anal bleeding, blood in stool, constipation, diarrhea, nausea and vomiting.  Musculoskeletal:  Negative for arthralgias and myalgias.  Skin:  Negative for color change and pallor.  Neurological:  Negative for dizziness, syncope and weakness.  Psychiatric/Behavioral:  Negative for confusion. The patient is not nervous/anxious.   All other systems reviewed and are negative.    Medications Medications Ordered Prior to Encounter[1]  Pertinent medications related to GI and procedure were reviewed by me with the patient prior to the procedure  Current Medications[2]  sodium chloride  500 mL (11/12/24 1100)       Allergies[3] Allergies were reviewed by me prior to the  procedure  Objective   Body mass index is 42.54 kg/m. Vitals:   11/12/24 1051  BP: (!) 145/93  Pulse: (!) 102  Resp: 18  Temp: (!) 96.8 F (36 C)  TempSrc: Temporal  SpO2: 99%  Weight: 123.2 kg  Height: 5' 7 (1.702 m)     Physical Exam Vitals and nursing note reviewed.  Constitutional:      General: He is not in acute distress.    Appearance: Normal appearance. He is obese. He is not ill-appearing, toxic-appearing or diaphoretic.  HENT:     Head: Normocephalic and atraumatic.     Nose: Nose normal.     Mouth/Throat:     Mouth: Mucous membranes are moist.     Pharynx: Oropharynx is clear.  Eyes:     General: No scleral icterus.    Extraocular Movements: Extraocular movements intact.  Cardiovascular:     Rate and Rhythm: Regular rhythm. Tachycardia present.     Heart sounds: Normal heart sounds. No murmur heard.    No friction rub. No gallop.  Pulmonary:     Effort: Pulmonary effort is normal. No respiratory distress.     Breath sounds: Normal breath sounds. No wheezing, rhonchi or rales.  Abdominal:     General: Bowel sounds are normal. There is no distension.     Palpations: Abdomen is soft.     Tenderness: There is no abdominal tenderness. There is no guarding or rebound.  Musculoskeletal:     Cervical back: Neck supple.     Right lower leg: No edema.     Left lower  leg: No edema.  Skin:    General: Skin is warm and dry.     Coloration: Skin is not jaundiced or pale.  Neurological:     General: No focal deficit present.     Mental Status: He is alert and oriented to person, place, and time. Mental status is at baseline.  Psychiatric:        Mood and Affect: Mood normal.        Behavior: Behavior normal.        Thought Content: Thought content normal.        Judgment: Judgment normal.      Assessment:  Mr. Caleb [name] is a 44 y.o. male  who presents today for Colonoscopy for Colorectal cancer screening .  Plan:  Colonoscopy with possible  intervention today  Colonoscopy with possible biopsy, control of bleeding, polypectomy, and interventions as necessary has been discussed with the patient/patient representative. Informed consent was obtained from the patient/patient representative after explaining the indication, nature, and risks of the procedure including but not limited to death, bleeding, perforation, missed neoplasm/lesions, cardiorespiratory compromise, and reaction to medications. Opportunity for questions was given and appropriate answers were provided. Patient/patient representative has verbalized understanding is amenable to undergoing the procedure.   Elspeth Ozell Jungling, DO  [facility] Gastroenterology  Portions of the record may have been created with voice recognition software. Occasional wrong-word or 'sound-a-like' substitutions may have occurred due to the inherent limitations of voice recognition software.  Read the chart carefully and recognize, using context, where substitutions may have occurred.     [1]  No current facility-administered medications on file prior to encounter.   Current Outpatient Medications on File Prior to Encounter  Medication Sig Dispense Refill   amLODipine  (NORVASC ) 2.5 MG tablet Take 1 tablet (2.5 mg total) by mouth 2 (two) times daily. 180 tablet 3   Cholecalciferol  (VITAMIN D3 PO) Take by mouth. Takes 5000 units daily     clindamycin  (CLEOCIN -T) 1 % lotion Apply 1 application topically daily. Apply after shower to affected areas. 60 mL 6   Multiple Vitamins-Minerals (ZINC PO) Take by mouth daily.     multivitamin (ONE-A-DAY MEN'S) TABS tablet Take 1 tablet by mouth daily.     QUERCETIN PO Take by mouth.     albuterol  (VENTOLIN  HFA) 108 (90 Base) MCG/ACT inhaler Inhale 1-2 puffs into the lungs every 4 (four) hours as needed for wheezing or shortness of breath. 18 g 11   cyclobenzaprine  (FLEXERIL ) 5 MG tablet Take 1 tablet (5 mg total) by mouth 2 (two) times daily as  needed. 60 tablet 11  [2]  Current Facility-Administered Medications:    0.9 %  sodium chloride  infusion, , Intravenous, Continuous, Jungling Elspeth Ozell, DO, Last Rate: 20 mL/hr at 11/12/24 1100, 500 mL at 11/12/24 1100 [3]  Allergies Allergen Reactions   Gabapentin     Felt back and didn't work    Lisinopril      Cough    Amoxicillin     Itching/crawling sensation     "

## 2024-11-12 NOTE — Interval H&P Note (Signed)
 History and Physical Interval Note: Preprocedure H&P from 11/12/24  was reviewed and there was no interval change after seeing and examining the patient.  Written consent was obtained from the patient after discussion of risks, benefits, and alternatives. Patient has consented to proceed with Colonoscopy with possible intervention   11/12/2024 11:37 AM  Caleb [name]  has presented today for surgery, with the diagnosis of Screening for colon cancer (Z12.11).  The various methods of treatment have been discussed with the patient and family. After consideration of risks, benefits and other options for treatment, the patient has consented to  Procedures: COLONOSCOPY (N/A) as a surgical intervention.  The patient's history has been reviewed, patient examined, no change in status, stable for surgery.  I have reviewed the patient's chart and labs.  Questions were answered to the patient's satisfaction.     Elspeth Ozell Jungling

## 2024-11-17 ENCOUNTER — Ambulatory Visit: Admitting: Nurse Practitioner

## 2024-12-02 ENCOUNTER — Ambulatory Visit: Admitting: Nurse Practitioner
